# Patient Record
Sex: Male | Born: 1965 | Race: White | Hispanic: No | Marital: Single | Smoking: Former smoker
Health system: Southern US, Community
[De-identification: ages and names within clinical notes are randomized; demographics above are authoritative.]

## PROBLEM LIST (undated history)

## (undated) DIAGNOSIS — I2510 Atherosclerotic heart disease of native coronary artery without angina pectoris: Secondary | ICD-10-CM

## (undated) DIAGNOSIS — E119 Type 2 diabetes mellitus without complications: Secondary | ICD-10-CM

## (undated) DIAGNOSIS — M238X1 Other internal derangements of right knee: Secondary | ICD-10-CM

## (undated) DIAGNOSIS — S7292XA Unspecified fracture of left femur, initial encounter for closed fracture: Secondary | ICD-10-CM

## (undated) DIAGNOSIS — E7800 Pure hypercholesterolemia, unspecified: Secondary | ICD-10-CM

## (undated) HISTORY — PX: IVC FILTER INSERTION: CATH118245

## (undated) HISTORY — PX: FRACTURE SURGERY: SHX138

---

## 1998-10-12 DIAGNOSIS — S069XAA Unspecified intracranial injury with loss of consciousness status unknown, initial encounter: Secondary | ICD-10-CM

## 1998-10-12 HISTORY — DX: Unspecified intracranial injury with loss of consciousness status unknown, initial encounter: S06.9XAA

## 2008-09-13 ENCOUNTER — Emergency Department: Payer: Self-pay | Admitting: Unknown Physician Specialty

## 2011-11-13 ENCOUNTER — Ambulatory Visit: Payer: Self-pay

## 2013-10-18 ENCOUNTER — Emergency Department: Payer: Self-pay | Admitting: Internal Medicine

## 2013-11-01 ENCOUNTER — Emergency Department: Payer: Self-pay | Admitting: Emergency Medicine

## 2014-08-17 ENCOUNTER — Inpatient Hospital Stay: Payer: Self-pay | Admitting: General Practice

## 2014-08-17 LAB — COMPREHENSIVE METABOLIC PANEL
Albumin: 3.6 g/dL (ref 3.4–5.0)
Alkaline Phosphatase: 179 U/L — ABNORMAL HIGH
Anion Gap: 11 (ref 7–16)
BUN: 14 mg/dL (ref 7–18)
Bilirubin,Total: 0.4 mg/dL (ref 0.2–1.0)
Calcium, Total: 8.3 mg/dL — ABNORMAL LOW (ref 8.5–10.1)
Chloride: 100 mmol/L (ref 98–107)
Co2: 23 mmol/L (ref 21–32)
Creatinine: 0.77 mg/dL (ref 0.60–1.30)
EGFR (African American): >60
EGFR (Non-African Amer.): >60
Glucose: 379 mg/dL — ABNORMAL HIGH (ref 65–99)
Osmolality: 284 (ref 275–301)
Potassium: 4 mmol/L (ref 3.5–5.1)
SGOT(AST): 27 U/L (ref 15–37)
SGPT (ALT): 41 U/L
Sodium: 134 mmol/L — ABNORMAL LOW (ref 136–145)
Total Protein: 8.3 g/dL — ABNORMAL HIGH (ref 6.4–8.2)

## 2014-08-17 LAB — CBC
HCT: 46.9 % (ref 40.0–52.0)
HGB: 15.6 g/dL (ref 13.0–18.0)
MCH: 30.9 pg (ref 26.0–34.0)
MCHC: 33.2 g/dL (ref 32.0–36.0)
MCV: 93 fL (ref 80–100)
Platelet: 278 10*3/uL (ref 150–440)
RBC: 5.02 10*6/uL (ref 4.40–5.90)
RDW: 12.5 % (ref 11.5–14.5)
WBC: 12.8 10*3/uL — ABNORMAL HIGH (ref 3.8–10.6)

## 2014-08-17 LAB — HEMOGLOBIN: HGB: 15.4 g/dL (ref 13.0–18.0)

## 2014-08-17 LAB — PROTIME-INR
INR: 1
Prothrombin Time: 13 secs (ref 11.5–14.7)

## 2014-08-17 LAB — APTT: Activated PTT: 28.1 secs (ref 23.6–35.9)

## 2014-08-17 LAB — HEMOGLOBIN A1C: Hemoglobin A1C: 12.6 % — ABNORMAL HIGH (ref 4.2–6.3)

## 2014-08-18 LAB — TROPONIN I
Troponin-I: <0.02 ng/mL
Troponin-I: <0.02 ng/mL

## 2014-08-19 LAB — CBC WITH DIFFERENTIAL/PLATELET
Basophil #: 0.1 10*3/uL (ref 0.0–0.1)
Basophil %: 0.7 %
Eosinophil #: 0.3 10*3/uL (ref 0.0–0.7)
Eosinophil %: 3.4 %
HCT: 39.7 % — ABNORMAL LOW (ref 40.0–52.0)
HGB: 13.3 g/dL (ref 13.0–18.0)
Lymphocyte #: 2.9 10*3/uL (ref 1.0–3.6)
Lymphocyte %: 34.1 %
MCH: 30.9 pg (ref 26.0–34.0)
MCHC: 33.5 g/dL (ref 32.0–36.0)
MCV: 92 fL (ref 80–100)
Monocyte #: 0.9 x10 3/mm (ref 0.2–1.0)
Monocyte %: 11 %
Neutrophil #: 4.3 10*3/uL (ref 1.4–6.5)
Neutrophil %: 50.8 %
Platelet: 241 10*3/uL (ref 150–440)
RBC: 4.32 10*6/uL — ABNORMAL LOW (ref 4.40–5.90)
RDW: 12.6 % (ref 11.5–14.5)
WBC: 8.4 10*3/uL (ref 3.8–10.6)

## 2014-08-19 LAB — BASIC METABOLIC PANEL WITH GFR
Anion Gap: 9
BUN: 10 mg/dL
Calcium, Total: 7.7 mg/dL — ABNORMAL LOW
Chloride: 105 mmol/L
Co2: 23 mmol/L
Creatinine: 0.61 mg/dL
EGFR (African American): >60
EGFR (Non-African Amer.): >60
Glucose: 278 mg/dL — ABNORMAL HIGH
Osmolality: 283
Potassium: 3.9 mmol/L
Sodium: 137 mmol/L

## 2014-08-19 LAB — CK TOTAL AND CKMB (NOT AT ARMC)
CK, Total: 144 U/L
CK, Total: 159 U/L
CK-MB: 1.8 ng/mL (ref 0.5–3.6)
CK-MB: 2.1 ng/mL (ref 0.5–3.6)

## 2014-08-19 LAB — TROPONIN I
Troponin-I: <0.02 ng/mL
Troponin-I: <0.02 ng/mL
Troponin-I: <0.02 ng/mL

## 2014-08-19 LAB — LIPID PANEL
Cholesterol: 131 mg/dL (ref 0–200)
HDL Cholesterol: 35 mg/dL — ABNORMAL LOW (ref 40–60)
Ldl Cholesterol, Calc: 71 mg/dL (ref 0–100)
Triglycerides: 123 mg/dL (ref 0–200)
VLDL Cholesterol, Calc: 25 mg/dL (ref 5–40)

## 2014-08-20 LAB — BASIC METABOLIC PANEL
Anion Gap: 7 (ref 7–16)
BUN: 7 mg/dL (ref 7–18)
Calcium, Total: 7.9 mg/dL — ABNORMAL LOW (ref 8.5–10.1)
Chloride: 104 mmol/L (ref 98–107)
Co2: 27 mmol/L (ref 21–32)
Creatinine: 0.54 mg/dL — ABNORMAL LOW (ref 0.60–1.30)
EGFR (African American): >60
EGFR (Non-African Amer.): >60
Glucose: 225 mg/dL — ABNORMAL HIGH (ref 65–99)
Osmolality: 281 (ref 275–301)
Potassium: 3.8 mmol/L (ref 3.5–5.1)
Sodium: 138 mmol/L (ref 136–145)

## 2014-08-20 LAB — CK TOTAL AND CKMB (NOT AT ARMC)
CK, Total: 171 U/L
CK-MB: 1.8 ng/mL (ref 0.5–3.6)

## 2014-08-20 LAB — HEMOGLOBIN: HGB: 13 g/dL (ref 13.0–18.0)

## 2014-08-20 LAB — PLATELET COUNT: Platelet: 256 10*3/uL (ref 150–440)

## 2014-08-20 LAB — TROPONIN I: Troponin-I: <0.02 ng/mL

## 2014-09-14 DIAGNOSIS — E1140 Type 2 diabetes mellitus with diabetic neuropathy, unspecified: Secondary | ICD-10-CM | POA: Insufficient documentation

## 2014-09-14 DIAGNOSIS — E1169 Type 2 diabetes mellitus with other specified complication: Secondary | ICD-10-CM | POA: Insufficient documentation

## 2014-09-14 DIAGNOSIS — E119 Type 2 diabetes mellitus without complications: Secondary | ICD-10-CM | POA: Insufficient documentation

## 2014-09-14 DIAGNOSIS — Z794 Long term (current) use of insulin: Secondary | ICD-10-CM | POA: Insufficient documentation

## 2014-09-14 DIAGNOSIS — I2510 Atherosclerotic heart disease of native coronary artery without angina pectoris: Secondary | ICD-10-CM | POA: Insufficient documentation

## 2014-10-15 DIAGNOSIS — Z96642 Presence of left artificial hip joint: Secondary | ICD-10-CM | POA: Diagnosis not present

## 2014-10-15 DIAGNOSIS — E669 Obesity, unspecified: Secondary | ICD-10-CM | POA: Diagnosis not present

## 2014-10-15 DIAGNOSIS — E119 Type 2 diabetes mellitus without complications: Secondary | ICD-10-CM | POA: Diagnosis not present

## 2014-10-15 DIAGNOSIS — S72462D Displaced supracondylar fracture with intracondylar extension of lower end of left femur, subsequent encounter for closed fracture with routine healing: Secondary | ICD-10-CM | POA: Diagnosis not present

## 2014-10-15 DIAGNOSIS — Z794 Long term (current) use of insulin: Secondary | ICD-10-CM | POA: Diagnosis not present

## 2014-10-15 DIAGNOSIS — Z9181 History of falling: Secondary | ICD-10-CM | POA: Diagnosis not present

## 2014-10-17 DIAGNOSIS — Z794 Long term (current) use of insulin: Secondary | ICD-10-CM | POA: Diagnosis not present

## 2014-10-17 DIAGNOSIS — Z96642 Presence of left artificial hip joint: Secondary | ICD-10-CM | POA: Diagnosis not present

## 2014-10-17 DIAGNOSIS — E669 Obesity, unspecified: Secondary | ICD-10-CM | POA: Diagnosis not present

## 2014-10-17 DIAGNOSIS — E119 Type 2 diabetes mellitus without complications: Secondary | ICD-10-CM | POA: Diagnosis not present

## 2014-10-17 DIAGNOSIS — S72462D Displaced supracondylar fracture with intracondylar extension of lower end of left femur, subsequent encounter for closed fracture with routine healing: Secondary | ICD-10-CM | POA: Diagnosis not present

## 2014-10-17 DIAGNOSIS — Z9181 History of falling: Secondary | ICD-10-CM | POA: Diagnosis not present

## 2014-10-18 DIAGNOSIS — E669 Obesity, unspecified: Secondary | ICD-10-CM | POA: Diagnosis not present

## 2014-10-18 DIAGNOSIS — Z96642 Presence of left artificial hip joint: Secondary | ICD-10-CM | POA: Diagnosis not present

## 2014-10-18 DIAGNOSIS — Z794 Long term (current) use of insulin: Secondary | ICD-10-CM | POA: Diagnosis not present

## 2014-10-18 DIAGNOSIS — S72462D Displaced supracondylar fracture with intracondylar extension of lower end of left femur, subsequent encounter for closed fracture with routine healing: Secondary | ICD-10-CM | POA: Diagnosis not present

## 2014-10-18 DIAGNOSIS — E119 Type 2 diabetes mellitus without complications: Secondary | ICD-10-CM | POA: Diagnosis not present

## 2014-10-18 DIAGNOSIS — Z9181 History of falling: Secondary | ICD-10-CM | POA: Diagnosis not present

## 2014-10-22 DIAGNOSIS — Z794 Long term (current) use of insulin: Secondary | ICD-10-CM | POA: Diagnosis not present

## 2014-10-22 DIAGNOSIS — E119 Type 2 diabetes mellitus without complications: Secondary | ICD-10-CM | POA: Diagnosis not present

## 2014-10-22 DIAGNOSIS — S72462D Displaced supracondylar fracture with intracondylar extension of lower end of left femur, subsequent encounter for closed fracture with routine healing: Secondary | ICD-10-CM | POA: Diagnosis not present

## 2014-10-22 DIAGNOSIS — Z9181 History of falling: Secondary | ICD-10-CM | POA: Diagnosis not present

## 2014-10-22 DIAGNOSIS — Z96642 Presence of left artificial hip joint: Secondary | ICD-10-CM | POA: Diagnosis not present

## 2014-10-22 DIAGNOSIS — E669 Obesity, unspecified: Secondary | ICD-10-CM | POA: Diagnosis not present

## 2014-10-23 DIAGNOSIS — Z96642 Presence of left artificial hip joint: Secondary | ICD-10-CM | POA: Diagnosis not present

## 2014-10-23 DIAGNOSIS — Z9181 History of falling: Secondary | ICD-10-CM | POA: Diagnosis not present

## 2014-10-23 DIAGNOSIS — E119 Type 2 diabetes mellitus without complications: Secondary | ICD-10-CM | POA: Diagnosis not present

## 2014-10-23 DIAGNOSIS — E669 Obesity, unspecified: Secondary | ICD-10-CM | POA: Diagnosis not present

## 2014-10-23 DIAGNOSIS — Z794 Long term (current) use of insulin: Secondary | ICD-10-CM | POA: Diagnosis not present

## 2014-10-23 DIAGNOSIS — S72462D Displaced supracondylar fracture with intracondylar extension of lower end of left femur, subsequent encounter for closed fracture with routine healing: Secondary | ICD-10-CM | POA: Diagnosis not present

## 2014-10-24 DIAGNOSIS — R0602 Shortness of breath: Secondary | ICD-10-CM | POA: Diagnosis not present

## 2014-10-25 DIAGNOSIS — Z96642 Presence of left artificial hip joint: Secondary | ICD-10-CM | POA: Diagnosis not present

## 2014-10-25 DIAGNOSIS — Z9181 History of falling: Secondary | ICD-10-CM | POA: Diagnosis not present

## 2014-10-25 DIAGNOSIS — E669 Obesity, unspecified: Secondary | ICD-10-CM | POA: Diagnosis not present

## 2014-10-25 DIAGNOSIS — S72462D Displaced supracondylar fracture with intracondylar extension of lower end of left femur, subsequent encounter for closed fracture with routine healing: Secondary | ICD-10-CM | POA: Diagnosis not present

## 2014-10-25 DIAGNOSIS — Z794 Long term (current) use of insulin: Secondary | ICD-10-CM | POA: Diagnosis not present

## 2014-10-25 DIAGNOSIS — E119 Type 2 diabetes mellitus without complications: Secondary | ICD-10-CM | POA: Diagnosis not present

## 2014-10-26 DIAGNOSIS — E119 Type 2 diabetes mellitus without complications: Secondary | ICD-10-CM | POA: Diagnosis not present

## 2014-10-26 DIAGNOSIS — S72462D Displaced supracondylar fracture with intracondylar extension of lower end of left femur, subsequent encounter for closed fracture with routine healing: Secondary | ICD-10-CM | POA: Diagnosis not present

## 2014-10-26 DIAGNOSIS — Z9181 History of falling: Secondary | ICD-10-CM | POA: Diagnosis not present

## 2014-10-26 DIAGNOSIS — E669 Obesity, unspecified: Secondary | ICD-10-CM | POA: Diagnosis not present

## 2014-10-26 DIAGNOSIS — Z96642 Presence of left artificial hip joint: Secondary | ICD-10-CM | POA: Diagnosis not present

## 2014-10-26 DIAGNOSIS — Z794 Long term (current) use of insulin: Secondary | ICD-10-CM | POA: Diagnosis not present

## 2014-10-27 DIAGNOSIS — S72412A Displaced unspecified condyle fracture of lower end of left femur, initial encounter for closed fracture: Secondary | ICD-10-CM | POA: Diagnosis not present

## 2014-10-29 DIAGNOSIS — Z7982 Long term (current) use of aspirin: Secondary | ICD-10-CM | POA: Diagnosis not present

## 2014-10-29 DIAGNOSIS — S72462D Displaced supracondylar fracture with intracondylar extension of lower end of left femur, subsequent encounter for closed fracture with routine healing: Secondary | ICD-10-CM | POA: Diagnosis not present

## 2014-10-29 DIAGNOSIS — E119 Type 2 diabetes mellitus without complications: Secondary | ICD-10-CM | POA: Diagnosis not present

## 2014-10-29 DIAGNOSIS — I10 Essential (primary) hypertension: Secondary | ICD-10-CM | POA: Diagnosis not present

## 2014-10-29 DIAGNOSIS — Z9181 History of falling: Secondary | ICD-10-CM | POA: Diagnosis not present

## 2014-10-29 DIAGNOSIS — Z794 Long term (current) use of insulin: Secondary | ICD-10-CM | POA: Diagnosis not present

## 2014-10-29 DIAGNOSIS — Z96642 Presence of left artificial hip joint: Secondary | ICD-10-CM | POA: Diagnosis not present

## 2014-10-29 DIAGNOSIS — E669 Obesity, unspecified: Secondary | ICD-10-CM | POA: Diagnosis not present

## 2014-10-30 DIAGNOSIS — E780 Pure hypercholesterolemia: Secondary | ICD-10-CM | POA: Diagnosis not present

## 2014-10-30 DIAGNOSIS — Z8679 Personal history of other diseases of the circulatory system: Secondary | ICD-10-CM | POA: Diagnosis not present

## 2014-10-30 DIAGNOSIS — R9431 Abnormal electrocardiogram [ECG] [EKG]: Secondary | ICD-10-CM | POA: Diagnosis not present

## 2014-10-30 DIAGNOSIS — E119 Type 2 diabetes mellitus without complications: Secondary | ICD-10-CM | POA: Diagnosis not present

## 2014-10-31 DIAGNOSIS — Z96642 Presence of left artificial hip joint: Secondary | ICD-10-CM | POA: Diagnosis not present

## 2014-10-31 DIAGNOSIS — E669 Obesity, unspecified: Secondary | ICD-10-CM | POA: Diagnosis not present

## 2014-10-31 DIAGNOSIS — Z794 Long term (current) use of insulin: Secondary | ICD-10-CM | POA: Diagnosis not present

## 2014-10-31 DIAGNOSIS — Z7982 Long term (current) use of aspirin: Secondary | ICD-10-CM | POA: Diagnosis not present

## 2014-10-31 DIAGNOSIS — Z9181 History of falling: Secondary | ICD-10-CM | POA: Diagnosis not present

## 2014-10-31 DIAGNOSIS — S72462D Displaced supracondylar fracture with intracondylar extension of lower end of left femur, subsequent encounter for closed fracture with routine healing: Secondary | ICD-10-CM | POA: Diagnosis not present

## 2014-10-31 DIAGNOSIS — I10 Essential (primary) hypertension: Secondary | ICD-10-CM | POA: Diagnosis not present

## 2014-10-31 DIAGNOSIS — E780 Pure hypercholesterolemia: Secondary | ICD-10-CM | POA: Diagnosis not present

## 2014-10-31 DIAGNOSIS — E119 Type 2 diabetes mellitus without complications: Secondary | ICD-10-CM | POA: Diagnosis not present

## 2014-11-01 DIAGNOSIS — Z794 Long term (current) use of insulin: Secondary | ICD-10-CM | POA: Diagnosis not present

## 2014-11-01 DIAGNOSIS — E669 Obesity, unspecified: Secondary | ICD-10-CM | POA: Diagnosis not present

## 2014-11-01 DIAGNOSIS — E119 Type 2 diabetes mellitus without complications: Secondary | ICD-10-CM | POA: Diagnosis not present

## 2014-11-01 DIAGNOSIS — I10 Essential (primary) hypertension: Secondary | ICD-10-CM | POA: Diagnosis not present

## 2014-11-01 DIAGNOSIS — Z7982 Long term (current) use of aspirin: Secondary | ICD-10-CM | POA: Diagnosis not present

## 2014-11-01 DIAGNOSIS — S72462D Displaced supracondylar fracture with intracondylar extension of lower end of left femur, subsequent encounter for closed fracture with routine healing: Secondary | ICD-10-CM | POA: Diagnosis not present

## 2014-11-01 DIAGNOSIS — Z96642 Presence of left artificial hip joint: Secondary | ICD-10-CM | POA: Diagnosis not present

## 2014-11-01 DIAGNOSIS — Z9181 History of falling: Secondary | ICD-10-CM | POA: Diagnosis not present

## 2014-11-06 DIAGNOSIS — I10 Essential (primary) hypertension: Secondary | ICD-10-CM | POA: Diagnosis not present

## 2014-11-06 DIAGNOSIS — Z9181 History of falling: Secondary | ICD-10-CM | POA: Diagnosis not present

## 2014-11-06 DIAGNOSIS — Z794 Long term (current) use of insulin: Secondary | ICD-10-CM | POA: Diagnosis not present

## 2014-11-06 DIAGNOSIS — S72462D Displaced supracondylar fracture with intracondylar extension of lower end of left femur, subsequent encounter for closed fracture with routine healing: Secondary | ICD-10-CM | POA: Diagnosis not present

## 2014-11-06 DIAGNOSIS — Z96642 Presence of left artificial hip joint: Secondary | ICD-10-CM | POA: Diagnosis not present

## 2014-11-06 DIAGNOSIS — E669 Obesity, unspecified: Secondary | ICD-10-CM | POA: Diagnosis not present

## 2014-11-06 DIAGNOSIS — Z7982 Long term (current) use of aspirin: Secondary | ICD-10-CM | POA: Diagnosis not present

## 2014-11-06 DIAGNOSIS — E119 Type 2 diabetes mellitus without complications: Secondary | ICD-10-CM | POA: Diagnosis not present

## 2014-11-08 DIAGNOSIS — E669 Obesity, unspecified: Secondary | ICD-10-CM | POA: Diagnosis not present

## 2014-11-08 DIAGNOSIS — Z96642 Presence of left artificial hip joint: Secondary | ICD-10-CM | POA: Diagnosis not present

## 2014-11-08 DIAGNOSIS — Z7982 Long term (current) use of aspirin: Secondary | ICD-10-CM | POA: Diagnosis not present

## 2014-11-08 DIAGNOSIS — S72452D Displaced supracondylar fracture without intracondylar extension of lower end of left femur, subsequent encounter for closed fracture with routine healing: Secondary | ICD-10-CM | POA: Diagnosis not present

## 2014-11-08 DIAGNOSIS — Z794 Long term (current) use of insulin: Secondary | ICD-10-CM | POA: Diagnosis not present

## 2014-11-08 DIAGNOSIS — I10 Essential (primary) hypertension: Secondary | ICD-10-CM | POA: Diagnosis not present

## 2014-11-08 DIAGNOSIS — E119 Type 2 diabetes mellitus without complications: Secondary | ICD-10-CM | POA: Diagnosis not present

## 2014-11-08 DIAGNOSIS — Z9181 History of falling: Secondary | ICD-10-CM | POA: Diagnosis not present

## 2014-11-08 DIAGNOSIS — S72462D Displaced supracondylar fracture with intracondylar extension of lower end of left femur, subsequent encounter for closed fracture with routine healing: Secondary | ICD-10-CM | POA: Diagnosis not present

## 2014-11-09 DIAGNOSIS — Z96642 Presence of left artificial hip joint: Secondary | ICD-10-CM | POA: Diagnosis not present

## 2014-11-09 DIAGNOSIS — Z9181 History of falling: Secondary | ICD-10-CM | POA: Diagnosis not present

## 2014-11-09 DIAGNOSIS — E669 Obesity, unspecified: Secondary | ICD-10-CM | POA: Diagnosis not present

## 2014-11-09 DIAGNOSIS — Z7982 Long term (current) use of aspirin: Secondary | ICD-10-CM | POA: Diagnosis not present

## 2014-11-09 DIAGNOSIS — E119 Type 2 diabetes mellitus without complications: Secondary | ICD-10-CM | POA: Diagnosis not present

## 2014-11-09 DIAGNOSIS — I10 Essential (primary) hypertension: Secondary | ICD-10-CM | POA: Diagnosis not present

## 2014-11-09 DIAGNOSIS — Z794 Long term (current) use of insulin: Secondary | ICD-10-CM | POA: Diagnosis not present

## 2014-11-09 DIAGNOSIS — S72462D Displaced supracondylar fracture with intracondylar extension of lower end of left femur, subsequent encounter for closed fracture with routine healing: Secondary | ICD-10-CM | POA: Diagnosis not present

## 2014-11-12 DIAGNOSIS — E669 Obesity, unspecified: Secondary | ICD-10-CM | POA: Diagnosis not present

## 2014-11-12 DIAGNOSIS — Z9181 History of falling: Secondary | ICD-10-CM | POA: Diagnosis not present

## 2014-11-12 DIAGNOSIS — Z96642 Presence of left artificial hip joint: Secondary | ICD-10-CM | POA: Diagnosis not present

## 2014-11-12 DIAGNOSIS — Z7982 Long term (current) use of aspirin: Secondary | ICD-10-CM | POA: Diagnosis not present

## 2014-11-12 DIAGNOSIS — Z794 Long term (current) use of insulin: Secondary | ICD-10-CM | POA: Diagnosis not present

## 2014-11-12 DIAGNOSIS — E119 Type 2 diabetes mellitus without complications: Secondary | ICD-10-CM | POA: Diagnosis not present

## 2014-11-12 DIAGNOSIS — S72462D Displaced supracondylar fracture with intracondylar extension of lower end of left femur, subsequent encounter for closed fracture with routine healing: Secondary | ICD-10-CM | POA: Diagnosis not present

## 2014-11-12 DIAGNOSIS — I10 Essential (primary) hypertension: Secondary | ICD-10-CM | POA: Diagnosis not present

## 2014-11-15 DIAGNOSIS — M79652 Pain in left thigh: Secondary | ICD-10-CM | POA: Diagnosis not present

## 2014-11-22 DIAGNOSIS — M79652 Pain in left thigh: Secondary | ICD-10-CM | POA: Diagnosis not present

## 2014-11-27 DIAGNOSIS — S72412A Displaced unspecified condyle fracture of lower end of left femur, initial encounter for closed fracture: Secondary | ICD-10-CM | POA: Diagnosis not present

## 2014-11-30 DIAGNOSIS — M79652 Pain in left thigh: Secondary | ICD-10-CM | POA: Diagnosis not present

## 2014-12-04 DIAGNOSIS — M79652 Pain in left thigh: Secondary | ICD-10-CM | POA: Diagnosis not present

## 2014-12-06 DIAGNOSIS — M79652 Pain in left thigh: Secondary | ICD-10-CM | POA: Diagnosis not present

## 2014-12-13 DIAGNOSIS — M79652 Pain in left thigh: Secondary | ICD-10-CM | POA: Diagnosis not present

## 2014-12-17 DIAGNOSIS — Z794 Long term (current) use of insulin: Secondary | ICD-10-CM | POA: Diagnosis not present

## 2014-12-17 DIAGNOSIS — I10 Essential (primary) hypertension: Secondary | ICD-10-CM | POA: Diagnosis not present

## 2014-12-17 DIAGNOSIS — E119 Type 2 diabetes mellitus without complications: Secondary | ICD-10-CM | POA: Diagnosis not present

## 2014-12-17 DIAGNOSIS — I2510 Atherosclerotic heart disease of native coronary artery without angina pectoris: Secondary | ICD-10-CM | POA: Diagnosis not present

## 2014-12-18 DIAGNOSIS — M238X1 Other internal derangements of right knee: Secondary | ICD-10-CM | POA: Diagnosis not present

## 2014-12-18 DIAGNOSIS — S72452D Displaced supracondylar fracture without intracondylar extension of lower end of left femur, subsequent encounter for closed fracture with routine healing: Secondary | ICD-10-CM | POA: Diagnosis not present

## 2014-12-20 DIAGNOSIS — M79652 Pain in left thigh: Secondary | ICD-10-CM | POA: Diagnosis not present

## 2014-12-26 DIAGNOSIS — S72412A Displaced unspecified condyle fracture of lower end of left femur, initial encounter for closed fracture: Secondary | ICD-10-CM | POA: Diagnosis not present

## 2014-12-27 DIAGNOSIS — E1165 Type 2 diabetes mellitus with hyperglycemia: Secondary | ICD-10-CM | POA: Diagnosis not present

## 2014-12-28 DIAGNOSIS — M79652 Pain in left thigh: Secondary | ICD-10-CM | POA: Diagnosis not present

## 2014-12-28 DIAGNOSIS — E1165 Type 2 diabetes mellitus with hyperglycemia: Secondary | ICD-10-CM | POA: Diagnosis not present

## 2015-01-01 DIAGNOSIS — M79652 Pain in left thigh: Secondary | ICD-10-CM | POA: Diagnosis not present

## 2015-01-03 DIAGNOSIS — M79652 Pain in left thigh: Secondary | ICD-10-CM | POA: Diagnosis not present

## 2015-01-15 DIAGNOSIS — M79652 Pain in left thigh: Secondary | ICD-10-CM | POA: Diagnosis not present

## 2015-01-17 DIAGNOSIS — M79652 Pain in left thigh: Secondary | ICD-10-CM | POA: Diagnosis not present

## 2015-01-17 DIAGNOSIS — S83511A Sprain of anterior cruciate ligament of right knee, initial encounter: Secondary | ICD-10-CM | POA: Diagnosis not present

## 2015-01-22 DIAGNOSIS — M79652 Pain in left thigh: Secondary | ICD-10-CM | POA: Diagnosis not present

## 2015-01-24 DIAGNOSIS — M79652 Pain in left thigh: Secondary | ICD-10-CM | POA: Diagnosis not present

## 2015-01-26 DIAGNOSIS — S72412A Displaced unspecified condyle fracture of lower end of left femur, initial encounter for closed fracture: Secondary | ICD-10-CM | POA: Diagnosis not present
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
## 2015-02-02 NOTE — Consult Note (Signed)
PATIENT NAME:  Shawn Rivas, Shawn Rivas DATE OF BIRTH:  1966/05/29  DATE OF CONSULTATION:  08/19/2014  REFERRING PHYSICIAN:   CONSULTING PHYSICIAN:  Alinda SierrasEileen A. Jarold Mottoomano, PA-C  Alinda SierrasEileen A. Jarold Mottoomano, physician assistant, dictating cardiology consult for Dr. Adrian BlackwaterShaukat Khan.  INDICATION FOR CONSULT: Preoperative clearance.   HISTORY OF PRESENT ILLNESS: This is a 49 year old, Caucasian male, who presented to the ER status post a mechanical fall and left femoral fracture needing open reduction, internal fixation procedure. He states the fall was mechanical and denies any dizziness or presyncope. He denies any chest pain, shortness of breath, nausea, vomiting, diuresis or left arm or jaw pain.   PAST MEDICAL HISTORY: New onset diabetes mellitus.   MEDICATIONS: Aspirin 81 mg daily.   ALLERGIES: TB SKIN TEST.   SOCIAL HISTORY: No EtOH abuse. A 40 pack-year history, but quit 2 years ago.   FAMILY HISTORY: Mother had PCI and died of an MI at age 49.   REVIEW OF SYSTEMS: CONSTITUTIONAL: No fatigue or malaise.  RESPIRATORY: No shortness of breath or cough. CARDIOVASCULAR: No chest pain, palpitations or orthopnea.  GASTROINTESTINAL: No abdominal pain. No nausea or vomiting.   PHYSICAL EXAMINATION: VITAL SIGNS: Temperature 98.2, respirations 20, pulse 95, blood pressure 125/80, and 99% saturation on room air.  GENERAL: A and O x 3, in no acute distress.  NECK: No JVD or carotid bruit.  CARDIOVASCULAR: Normal S1, S2. No audible murmurs. PULMONARY: Good air entry bilaterally. No wheezes, rhonchi or rales.  EXTREMITIES: No pedal edema. Left lower extremity is in a trapeze.   LABORATORY DATA: Creatinine 0.77, troponin negative x 3.   EKG shows sinus tachycardia 101 beats per minute. Questionable Q wave in inferior leads, left atrial enlargement.   ASSESSMENT AND PLAN: The patient is chest pain free, negative troponins. While he does have risk factors with new onset diabetes mellitus, he remains  low risk for procedure, advise proceeding.   Thank you very much for this consultation.    ____________________________ Alinda SierrasEileen A. Jarold Mottoomano, PA-C ear:JT D: 08/19/2014 10:01:30 ET T: 08/19/2014 10:57:12 ET JOB#: [id number]  cc: Marjean DonnaEileen A. Margarito Courseromano, PA-C, <Dictator> Rise MuEILEEN A Husam Hohn PA ELECTRONICALLY SIGNED 09/15/2014 16:46

## 2015-02-02 NOTE — Consult Note (Signed)
Brief Consult Note: Patient was seen by consultant.   Recommend to proceed with surgery or procedure.   Comments: Pt seen, consultation to be dictated. Pt with new onset DM, obesity and tobacco abuse needing LLE ortho procedure. Pt denies CP or SOB, no PMHx CAD or CHF, will check troponins and echo, but advise proceeding with surgery tomorrow, pt is low-mod risk.  Electronic Signatures: Rise MuRomano, Shakil Dirk A (PA-C)  (Signed [phone]-Nov-15 16:45)  Authored: Brief Consult Note   Last Updated: 07-Nov-15 16:45 by Rise Muomano, Adelene Polivka A (PA-C)

## 2015-02-02 NOTE — Consult Note (Signed)
Patient is stable cardiac point of view, advise proceeding with surgery.  Electronic Signatures: Radene KneeKhan, Jesus Poplin Ali (MD)  (Signed on 07-Nov-15 17:09)  Authored  Last Updated: 07-Nov-15 17:09 by Radene KneeKhan, Obaloluwa Delatte Ali (MD)

## 2015-02-02 NOTE — Consult Note (Signed)
Brief Consult Note: Diagnosis: 49 yr old male with no pmh comes in with fall and left distal femus fracture.fell  due to loss balance;no dizziness,no chest pain.   Comments: 1.left distal femur communited fracture; 2.elevated BG,New onset DMII;check hba1c low risk for planned surgery. use IS,BG with SSI with coverage,plan to start metformin after surgery tahcycarida with out EKG chnages:l due to severe  left knee pain.  Electronic Signatures: Katha HammingKonidena, Jafeth Mustin (MD)  (Signed [phone]-Nov-15 15:44)  Authored: Brief Consult Note   Last Updated: 06-Nov-15 15:44 by Katha HammingKonidena, Azan Maneri (MD)
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
## 2015-02-11 DIAGNOSIS — M79652 Pain in left thigh: Secondary | ICD-10-CM | POA: Diagnosis not present

## 2015-02-14 DIAGNOSIS — M79652 Pain in left thigh: Secondary | ICD-10-CM | POA: Diagnosis not present

## 2015-02-18 DIAGNOSIS — M79652 Pain in left thigh: Secondary | ICD-10-CM | POA: Diagnosis not present

## 2015-02-19 DIAGNOSIS — E1165 Type 2 diabetes mellitus with hyperglycemia: Secondary | ICD-10-CM | POA: Diagnosis not present

## 2015-02-19 DIAGNOSIS — Z794 Long term (current) use of insulin: Secondary | ICD-10-CM | POA: Diagnosis not present

## 2015-02-19 DIAGNOSIS — E119 Type 2 diabetes mellitus without complications: Secondary | ICD-10-CM | POA: Diagnosis not present

## 2015-02-21 DIAGNOSIS — M79652 Pain in left thigh: Secondary | ICD-10-CM | POA: Diagnosis not present

## 2015-02-25 DIAGNOSIS — M79652 Pain in left thigh: Secondary | ICD-10-CM | POA: Diagnosis not present

## 2015-02-25 DIAGNOSIS — S72412A Displaced unspecified condyle fracture of lower end of left femur, initial encounter for closed fracture: Secondary | ICD-10-CM | POA: Diagnosis not present

## 2015-02-28 DIAGNOSIS — M238X1 Other internal derangements of right knee: Secondary | ICD-10-CM | POA: Diagnosis not present

## 2015-02-28 DIAGNOSIS — M79652 Pain in left thigh: Secondary | ICD-10-CM | POA: Diagnosis not present

## 2015-02-28 DIAGNOSIS — S72452D Displaced supracondylar fracture without intracondylar extension of lower end of left femur, subsequent encounter for closed fracture with routine healing: Secondary | ICD-10-CM | POA: Diagnosis not present

## 2015-03-04 DIAGNOSIS — M79652 Pain in left thigh: Secondary | ICD-10-CM | POA: Diagnosis not present

## 2015-03-28 DIAGNOSIS — S72412A Displaced unspecified condyle fracture of lower end of left femur, initial encounter for closed fracture: Secondary | ICD-10-CM | POA: Diagnosis not present

## 2015-04-27 DIAGNOSIS — S72412A Displaced unspecified condyle fracture of lower end of left femur, initial encounter for closed fracture: Secondary | ICD-10-CM | POA: Diagnosis not present

## 2015-05-28 DIAGNOSIS — S72412A Displaced unspecified condyle fracture of lower end of left femur, initial encounter for closed fracture: Secondary | ICD-10-CM | POA: Diagnosis not present
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
## 2016-05-25 ENCOUNTER — Ambulatory Visit
Admission: RE | Admit: 2016-05-25 | Discharge: 2016-05-25 | Disposition: A | Discharge status: Home / Self Care | Payer: Disability Insurance | Source: Ambulatory Visit | Attending: Internal Medicine | Admitting: Internal Medicine

## 2016-05-25 ENCOUNTER — Other Ambulatory Visit: Payer: Self-pay | Admitting: Internal Medicine

## 2016-05-25 DIAGNOSIS — W230XXS Caught, crushed, jammed, or pinched between moving objects, sequela: Secondary | ICD-10-CM | POA: Diagnosis not present

## 2016-05-25 DIAGNOSIS — S9702XA Crushing injury of left ankle, initial encounter: Secondary | ICD-10-CM

## 2016-05-25 DIAGNOSIS — S7702XA Crushing injury of left hip, initial encounter: Secondary | ICD-10-CM

## 2016-05-25 DIAGNOSIS — S7702XS Crushing injury of left hip, sequela: Secondary | ICD-10-CM | POA: Insufficient documentation

## 2016-05-25 DIAGNOSIS — S8702XA Crushing injury of left knee, initial encounter: Secondary | ICD-10-CM

## 2016-05-25 DIAGNOSIS — Z9889 Other specified postprocedural states: Secondary | ICD-10-CM | POA: Diagnosis not present

## 2016-06-08 DIAGNOSIS — Z9119 Patient's noncompliance with other medical treatment and regimen: Secondary | ICD-10-CM | POA: Diagnosis not present

## 2016-06-08 DIAGNOSIS — L02612 Cutaneous abscess of left foot: Secondary | ICD-10-CM | POA: Diagnosis not present

## 2016-06-09 DIAGNOSIS — G8921 Chronic pain due to trauma: Secondary | ICD-10-CM | POA: Insufficient documentation

## 2016-06-09 DIAGNOSIS — E11621 Type 2 diabetes mellitus with foot ulcer: Secondary | ICD-10-CM | POA: Diagnosis not present

## 2016-06-09 DIAGNOSIS — E119 Type 2 diabetes mellitus without complications: Secondary | ICD-10-CM | POA: Diagnosis not present

## 2016-06-09 DIAGNOSIS — L97509 Non-pressure chronic ulcer of other part of unspecified foot with unspecified severity: Secondary | ICD-10-CM | POA: Diagnosis not present

## 2016-06-09 DIAGNOSIS — Z794 Long term (current) use of insulin: Secondary | ICD-10-CM | POA: Diagnosis not present

## 2016-06-09 DIAGNOSIS — L97529 Non-pressure chronic ulcer of other part of left foot with unspecified severity: Secondary | ICD-10-CM | POA: Diagnosis not present

## 2016-06-09 DIAGNOSIS — E1142 Type 2 diabetes mellitus with diabetic polyneuropathy: Secondary | ICD-10-CM | POA: Diagnosis not present

## 2016-06-09 DIAGNOSIS — R030 Elevated blood-pressure reading, without diagnosis of hypertension: Secondary | ICD-10-CM | POA: Diagnosis not present

## 2016-06-09 DIAGNOSIS — L97522 Non-pressure chronic ulcer of other part of left foot with fat layer exposed: Secondary | ICD-10-CM | POA: Diagnosis not present

## 2016-06-16 DIAGNOSIS — L97521 Non-pressure chronic ulcer of other part of left foot limited to breakdown of skin: Secondary | ICD-10-CM | POA: Diagnosis not present

## 2016-06-16 DIAGNOSIS — Z794 Long term (current) use of insulin: Secondary | ICD-10-CM | POA: Diagnosis not present

## 2016-06-16 DIAGNOSIS — E11621 Type 2 diabetes mellitus with foot ulcer: Secondary | ICD-10-CM | POA: Diagnosis not present

## 2016-06-16 DIAGNOSIS — Z0000 Encounter for general adult medical examination without abnormal findings: Secondary | ICD-10-CM | POA: Diagnosis not present

## 2016-06-16 DIAGNOSIS — L97509 Non-pressure chronic ulcer of other part of unspecified foot with unspecified severity: Secondary | ICD-10-CM | POA: Diagnosis not present

## 2016-06-16 DIAGNOSIS — E7800 Pure hypercholesterolemia, unspecified: Secondary | ICD-10-CM | POA: Diagnosis not present

## 2016-06-23 DIAGNOSIS — Z794 Long term (current) use of insulin: Secondary | ICD-10-CM | POA: Diagnosis not present

## 2016-06-23 DIAGNOSIS — E119 Type 2 diabetes mellitus without complications: Secondary | ICD-10-CM | POA: Diagnosis not present

## 2016-06-25 DIAGNOSIS — R197 Diarrhea, unspecified: Secondary | ICD-10-CM | POA: Diagnosis not present

## 2016-06-25 DIAGNOSIS — Z1211 Encounter for screening for malignant neoplasm of colon: Secondary | ICD-10-CM | POA: Diagnosis not present

## 2016-06-30 DIAGNOSIS — L97521 Non-pressure chronic ulcer of other part of left foot limited to breakdown of skin: Secondary | ICD-10-CM | POA: Diagnosis not present

## 2016-06-30 DIAGNOSIS — E11621 Type 2 diabetes mellitus with foot ulcer: Secondary | ICD-10-CM | POA: Diagnosis not present

## 2016-06-30 DIAGNOSIS — I83029 Varicose veins of left lower extremity with ulcer of unspecified site: Secondary | ICD-10-CM | POA: Diagnosis not present

## 2016-06-30 DIAGNOSIS — L97509 Non-pressure chronic ulcer of other part of unspecified foot with unspecified severity: Secondary | ICD-10-CM | POA: Diagnosis not present

## 2016-06-30 DIAGNOSIS — I872 Venous insufficiency (chronic) (peripheral): Secondary | ICD-10-CM | POA: Diagnosis not present

## 2016-07-06 DIAGNOSIS — E7800 Pure hypercholesterolemia, unspecified: Secondary | ICD-10-CM | POA: Diagnosis not present

## 2016-07-06 DIAGNOSIS — E119 Type 2 diabetes mellitus without complications: Secondary | ICD-10-CM | POA: Diagnosis not present

## 2016-07-06 DIAGNOSIS — M545 Low back pain: Secondary | ICD-10-CM | POA: Diagnosis not present

## 2016-07-06 DIAGNOSIS — Z794 Long term (current) use of insulin: Secondary | ICD-10-CM | POA: Diagnosis not present

## 2016-07-14 DIAGNOSIS — N390 Urinary tract infection, site not specified: Secondary | ICD-10-CM | POA: Diagnosis not present

## 2016-07-14 DIAGNOSIS — M62830 Muscle spasm of back: Secondary | ICD-10-CM | POA: Diagnosis not present

## 2016-07-17 DIAGNOSIS — M79609 Pain in unspecified limb: Secondary | ICD-10-CM | POA: Diagnosis not present

## 2016-07-22 DIAGNOSIS — E1165 Type 2 diabetes mellitus with hyperglycemia: Secondary | ICD-10-CM | POA: Diagnosis not present

## 2016-07-22 DIAGNOSIS — L97521 Non-pressure chronic ulcer of other part of left foot limited to breakdown of skin: Secondary | ICD-10-CM | POA: Diagnosis not present

## 2016-07-22 DIAGNOSIS — E1142 Type 2 diabetes mellitus with diabetic polyneuropathy: Secondary | ICD-10-CM | POA: Diagnosis not present

## 2016-07-22 DIAGNOSIS — Z794 Long term (current) use of insulin: Secondary | ICD-10-CM | POA: Diagnosis not present

## 2016-07-22 DIAGNOSIS — E11621 Type 2 diabetes mellitus with foot ulcer: Secondary | ICD-10-CM | POA: Diagnosis not present

## 2016-07-22 DIAGNOSIS — L97421 Non-pressure chronic ulcer of left heel and midfoot limited to breakdown of skin: Secondary | ICD-10-CM | POA: Diagnosis not present

## 2016-09-30 DIAGNOSIS — Z794 Long term (current) use of insulin: Secondary | ICD-10-CM | POA: Diagnosis not present

## 2016-09-30 DIAGNOSIS — E119 Type 2 diabetes mellitus without complications: Secondary | ICD-10-CM | POA: Diagnosis not present

## 2016-09-30 DIAGNOSIS — E7800 Pure hypercholesterolemia, unspecified: Secondary | ICD-10-CM | POA: Diagnosis not present

## 2016-10-19 DIAGNOSIS — K219 Gastro-esophageal reflux disease without esophagitis: Secondary | ICD-10-CM | POA: Diagnosis not present

## 2016-10-19 DIAGNOSIS — E7800 Pure hypercholesterolemia, unspecified: Secondary | ICD-10-CM | POA: Diagnosis not present

## 2016-10-19 DIAGNOSIS — E119 Type 2 diabetes mellitus without complications: Secondary | ICD-10-CM | POA: Diagnosis not present

## 2016-10-19 DIAGNOSIS — Z794 Long term (current) use of insulin: Secondary | ICD-10-CM | POA: Diagnosis not present

## 2016-10-19 DIAGNOSIS — I2510 Atherosclerotic heart disease of native coronary artery without angina pectoris: Secondary | ICD-10-CM | POA: Diagnosis not present

## 2016-10-23 ENCOUNTER — Encounter: Payer: Self-pay | Admitting: *Deleted

## 2016-10-26 ENCOUNTER — Ambulatory Visit: Admission: RE | Admit: 2016-10-26 | Payer: Medicare Other | Source: Ambulatory Visit | Admitting: Gastroenterology

## 2016-10-26 ENCOUNTER — Encounter: Admission: RE | Payer: Self-pay | Source: Ambulatory Visit

## 2016-10-26 HISTORY — DX: Pure hypercholesterolemia, unspecified: E78.00

## 2016-10-26 HISTORY — DX: Type 2 diabetes mellitus without complications: E11.9

## 2016-10-26 HISTORY — DX: Atherosclerotic heart disease of native coronary artery without angina pectoris: I25.10

## 2016-10-26 HISTORY — DX: Other internal derangements of right knee: M23.8X1

## 2016-10-26 HISTORY — DX: Unspecified fracture of left femur, initial encounter for closed fracture: S72.92XA

## 2016-10-26 SURGERY — COLONOSCOPY WITH PROPOFOL
Anesthesia: General

## 2016-12-18 DIAGNOSIS — M79642 Pain in left hand: Secondary | ICD-10-CM | POA: Diagnosis not present

## 2016-12-18 DIAGNOSIS — M545 Low back pain: Secondary | ICD-10-CM | POA: Diagnosis not present

## 2016-12-18 DIAGNOSIS — M21371 Foot drop, right foot: Secondary | ICD-10-CM | POA: Diagnosis not present

## 2016-12-18 DIAGNOSIS — M79641 Pain in right hand: Secondary | ICD-10-CM | POA: Diagnosis not present

## 2017-03-05 DIAGNOSIS — E1142 Type 2 diabetes mellitus with diabetic polyneuropathy: Secondary | ICD-10-CM | POA: Diagnosis not present

## 2017-03-05 DIAGNOSIS — E11621 Type 2 diabetes mellitus with foot ulcer: Secondary | ICD-10-CM | POA: Diagnosis not present

## 2017-03-05 DIAGNOSIS — I10 Essential (primary) hypertension: Secondary | ICD-10-CM | POA: Diagnosis not present

## 2017-03-05 DIAGNOSIS — E1165 Type 2 diabetes mellitus with hyperglycemia: Secondary | ICD-10-CM | POA: Diagnosis not present

## 2017-03-05 DIAGNOSIS — L97521 Non-pressure chronic ulcer of other part of left foot limited to breakdown of skin: Secondary | ICD-10-CM | POA: Diagnosis not present

## 2017-03-05 DIAGNOSIS — Z794 Long term (current) use of insulin: Secondary | ICD-10-CM | POA: Diagnosis not present

## 2017-03-24 DIAGNOSIS — L97521 Non-pressure chronic ulcer of other part of left foot limited to breakdown of skin: Secondary | ICD-10-CM | POA: Diagnosis not present

## 2017-04-26 DIAGNOSIS — L97521 Non-pressure chronic ulcer of other part of left foot limited to breakdown of skin: Secondary | ICD-10-CM | POA: Diagnosis not present

## 2017-06-21 DIAGNOSIS — M79609 Pain in unspecified limb: Secondary | ICD-10-CM | POA: Diagnosis not present

## 2017-06-21 DIAGNOSIS — M545 Low back pain: Secondary | ICD-10-CM | POA: Diagnosis not present

## 2017-08-04 DIAGNOSIS — E119 Type 2 diabetes mellitus without complications: Secondary | ICD-10-CM | POA: Diagnosis not present

## 2017-08-04 DIAGNOSIS — Z794 Long term (current) use of insulin: Secondary | ICD-10-CM | POA: Diagnosis not present

## 2017-08-04 DIAGNOSIS — Z7185 Encounter for immunization safety counseling: Secondary | ICD-10-CM | POA: Insufficient documentation

## 2017-08-04 DIAGNOSIS — Z0000 Encounter for general adult medical examination without abnormal findings: Secondary | ICD-10-CM | POA: Diagnosis not present

## 2017-08-05 DIAGNOSIS — Z794 Long term (current) use of insulin: Secondary | ICD-10-CM | POA: Diagnosis not present

## 2017-08-05 DIAGNOSIS — E119 Type 2 diabetes mellitus without complications: Secondary | ICD-10-CM | POA: Diagnosis not present

## 2017-08-05 DIAGNOSIS — Z125 Encounter for screening for malignant neoplasm of prostate: Secondary | ICD-10-CM | POA: Diagnosis not present

## 2017-08-05 DIAGNOSIS — Z0000 Encounter for general adult medical examination without abnormal findings: Secondary | ICD-10-CM | POA: Diagnosis not present

## 2017-12-13 DIAGNOSIS — E1142 Type 2 diabetes mellitus with diabetic polyneuropathy: Secondary | ICD-10-CM | POA: Insufficient documentation

## 2018-05-25 DIAGNOSIS — E66811 Obesity, class 1: Secondary | ICD-10-CM | POA: Insufficient documentation

## 2018-09-21 ENCOUNTER — Ambulatory Visit: Payer: Medicare Other | Admitting: Physical Therapy

## 2018-09-27 ENCOUNTER — Ambulatory Visit: Payer: Medicare Other | Admitting: Physical Therapy

## 2018-10-04 ENCOUNTER — Encounter: Payer: Disability Insurance | Admitting: Physical Therapy

## 2018-10-06 ENCOUNTER — Encounter: Payer: Disability Insurance | Admitting: Physical Therapy

## 2018-10-10 ENCOUNTER — Encounter: Payer: Disability Insurance | Admitting: Physical Therapy

## 2018-10-13 ENCOUNTER — Encounter: Payer: Medicare Other | Admitting: Physical Therapy

## 2018-10-20 ENCOUNTER — Ambulatory Visit: Payer: Medicare Other | Attending: Family Medicine | Admitting: Physical Therapy

## 2018-10-20 ENCOUNTER — Encounter: Payer: Self-pay | Admitting: Physical Therapy

## 2018-10-20 DIAGNOSIS — R2689 Other abnormalities of gait and mobility: Secondary | ICD-10-CM | POA: Diagnosis present

## 2018-10-20 DIAGNOSIS — R262 Difficulty in walking, not elsewhere classified: Secondary | ICD-10-CM | POA: Diagnosis present

## 2018-10-20 DIAGNOSIS — M6281 Muscle weakness (generalized): Secondary | ICD-10-CM | POA: Diagnosis present

## 2018-10-20 DIAGNOSIS — R296 Repeated falls: Secondary | ICD-10-CM | POA: Diagnosis present

## 2018-10-20 DIAGNOSIS — R609 Edema, unspecified: Secondary | ICD-10-CM | POA: Diagnosis present

## 2018-10-20 DIAGNOSIS — R209 Unspecified disturbances of skin sensation: Secondary | ICD-10-CM | POA: Diagnosis present

## 2018-10-20 NOTE — Therapy (Signed)
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

## 2018-10-25 ENCOUNTER — Encounter: Payer: Self-pay | Admitting: Physical Therapy

## 2018-10-25 ENCOUNTER — Ambulatory Visit: Payer: Medicare Other | Admitting: Physical Therapy

## 2018-10-25 DIAGNOSIS — R2689 Other abnormalities of gait and mobility: Secondary | ICD-10-CM

## 2018-10-25 DIAGNOSIS — M6281 Muscle weakness (generalized): Secondary | ICD-10-CM

## 2018-10-25 DIAGNOSIS — R296 Repeated falls: Secondary | ICD-10-CM | POA: Diagnosis not present

## 2018-10-25 DIAGNOSIS — R262 Difficulty in walking, not elsewhere classified: Secondary | ICD-10-CM

## 2018-10-25 DIAGNOSIS — R209 Unspecified disturbances of skin sensation: Secondary | ICD-10-CM

## 2018-10-25 DIAGNOSIS — R609 Edema, unspecified: Secondary | ICD-10-CM
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
## 2018-10-27 ENCOUNTER — Ambulatory Visit: Payer: Medicare Other | Admitting: Physical Therapy

## 2018-10-27 DIAGNOSIS — R296 Repeated falls: Secondary | ICD-10-CM

## 2018-10-27 DIAGNOSIS — R609 Edema, unspecified: Secondary | ICD-10-CM

## 2018-10-27 DIAGNOSIS — R262 Difficulty in walking, not elsewhere classified: Secondary | ICD-10-CM

## 2018-10-27 DIAGNOSIS — M6281 Muscle weakness (generalized): Secondary | ICD-10-CM

## 2018-10-27 DIAGNOSIS — R209 Unspecified disturbances of skin sensation: Secondary | ICD-10-CM

## 2018-10-27 DIAGNOSIS — R2689 Other abnormalities of gait and mobility: Secondary | ICD-10-CM
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
## 2018-10-28 ENCOUNTER — Encounter: Payer: Self-pay | Admitting: Physical Therapy

## 2018-11-01 ENCOUNTER — Ambulatory Visit: Payer: Self-pay

## 2018-11-01 ENCOUNTER — Ambulatory Visit: Payer: Medicare Other | Admitting: Physical Therapy

## 2018-11-01 DIAGNOSIS — R262 Difficulty in walking, not elsewhere classified: Secondary | ICD-10-CM

## 2018-11-01 DIAGNOSIS — R296 Repeated falls: Secondary | ICD-10-CM | POA: Diagnosis not present

## 2018-11-01 DIAGNOSIS — R209 Unspecified disturbances of skin sensation: Secondary | ICD-10-CM

## 2018-11-01 DIAGNOSIS — R609 Edema, unspecified: Secondary | ICD-10-CM

## 2018-11-01 DIAGNOSIS — M6281 Muscle weakness (generalized): Secondary | ICD-10-CM

## 2018-11-01 DIAGNOSIS — R2689 Other abnormalities of gait and mobility: Secondary | ICD-10-CM
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
## 2018-11-03 ENCOUNTER — Ambulatory Visit: Payer: Medicare Other | Admitting: Physical Therapy

## 2018-11-03 ENCOUNTER — Encounter: Payer: Self-pay | Admitting: Physical Therapy

## 2018-11-03 DIAGNOSIS — R296 Repeated falls: Secondary | ICD-10-CM | POA: Diagnosis not present

## 2018-11-03 DIAGNOSIS — R2689 Other abnormalities of gait and mobility: Secondary | ICD-10-CM

## 2018-11-03 DIAGNOSIS — M6281 Muscle weakness (generalized): Secondary | ICD-10-CM

## 2018-11-03 DIAGNOSIS — R262 Difficulty in walking, not elsewhere classified: Secondary | ICD-10-CM

## 2018-11-03 DIAGNOSIS — R609 Edema, unspecified: Secondary | ICD-10-CM

## 2018-11-03 DIAGNOSIS — R209 Unspecified disturbances of skin sensation: Secondary | ICD-10-CM
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
## 2018-11-08 ENCOUNTER — Encounter: Payer: Self-pay | Admitting: Physical Therapy

## 2018-11-08 ENCOUNTER — Ambulatory Visit: Payer: Medicare Other | Admitting: Physical Therapy

## 2018-11-08 ENCOUNTER — Ambulatory Visit: Payer: Medicare Other

## 2018-11-08 DIAGNOSIS — R296 Repeated falls: Secondary | ICD-10-CM | POA: Diagnosis not present

## 2018-11-08 DIAGNOSIS — R262 Difficulty in walking, not elsewhere classified: Secondary | ICD-10-CM

## 2018-11-08 DIAGNOSIS — R209 Unspecified disturbances of skin sensation: Secondary | ICD-10-CM

## 2018-11-08 DIAGNOSIS — M6281 Muscle weakness (generalized): Secondary | ICD-10-CM

## 2018-11-08 DIAGNOSIS — R609 Edema, unspecified: Secondary | ICD-10-CM

## 2018-11-08 DIAGNOSIS — R2689 Other abnormalities of gait and mobility: Secondary | ICD-10-CM
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
## 2018-11-10 ENCOUNTER — Encounter: Payer: Self-pay | Admitting: Physical Therapy

## 2018-11-10 ENCOUNTER — Ambulatory Visit: Payer: Medicare Other | Admitting: Physical Therapy

## 2018-11-10 DIAGNOSIS — R209 Unspecified disturbances of skin sensation: Secondary | ICD-10-CM

## 2018-11-10 DIAGNOSIS — R262 Difficulty in walking, not elsewhere classified: Secondary | ICD-10-CM

## 2018-11-10 DIAGNOSIS — R296 Repeated falls: Secondary | ICD-10-CM

## 2018-11-10 DIAGNOSIS — R2689 Other abnormalities of gait and mobility: Secondary | ICD-10-CM

## 2018-11-10 DIAGNOSIS — R609 Edema, unspecified: Secondary | ICD-10-CM

## 2018-11-10 DIAGNOSIS — M6281 Muscle weakness (generalized): Secondary | ICD-10-CM
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
## 2018-11-15 ENCOUNTER — Ambulatory Visit: Payer: Self-pay

## 2018-11-15 ENCOUNTER — Ambulatory Visit: Payer: Medicare Other | Attending: Family Medicine | Admitting: Physical Therapy

## 2018-11-15 ENCOUNTER — Encounter: Payer: Disability Insurance | Admitting: Physical Therapy

## 2018-11-15 ENCOUNTER — Encounter: Payer: Self-pay | Admitting: Physical Therapy

## 2018-11-15 DIAGNOSIS — R609 Edema, unspecified: Secondary | ICD-10-CM | POA: Diagnosis present

## 2018-11-15 DIAGNOSIS — R209 Unspecified disturbances of skin sensation: Secondary | ICD-10-CM | POA: Insufficient documentation

## 2018-11-15 DIAGNOSIS — R262 Difficulty in walking, not elsewhere classified: Secondary | ICD-10-CM | POA: Insufficient documentation

## 2018-11-15 DIAGNOSIS — M6281 Muscle weakness (generalized): Secondary | ICD-10-CM | POA: Diagnosis present

## 2018-11-15 DIAGNOSIS — R2689 Other abnormalities of gait and mobility: Secondary | ICD-10-CM | POA: Diagnosis present

## 2018-11-15 DIAGNOSIS — R296 Repeated falls: Secondary | ICD-10-CM | POA: Diagnosis present
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
## 2018-11-16 ENCOUNTER — Encounter: Payer: Self-pay | Admitting: Physical Therapy

## 2018-11-16 ENCOUNTER — Ambulatory Visit: Payer: Medicare Other | Admitting: Physical Therapy

## 2018-11-16 VITALS — BP 148/92 | HR 90

## 2018-11-16 DIAGNOSIS — R296 Repeated falls: Secondary | ICD-10-CM

## 2018-11-16 DIAGNOSIS — R609 Edema, unspecified: Secondary | ICD-10-CM

## 2018-11-16 DIAGNOSIS — M6281 Muscle weakness (generalized): Secondary | ICD-10-CM

## 2018-11-16 DIAGNOSIS — R262 Difficulty in walking, not elsewhere classified: Secondary | ICD-10-CM

## 2018-11-16 DIAGNOSIS — R2689 Other abnormalities of gait and mobility: Secondary | ICD-10-CM

## 2018-11-16 DIAGNOSIS — R209 Unspecified disturbances of skin sensation: Secondary | ICD-10-CM
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
## 2018-11-22 ENCOUNTER — Encounter: Payer: Disability Insurance | Admitting: Physical Therapy

## 2018-11-22 ENCOUNTER — Ambulatory Visit: Payer: Medicare Other | Admitting: Physical Therapy

## 2018-11-22 ENCOUNTER — Telehealth: Payer: Self-pay | Admitting: Physical Therapy

## 2018-11-22 ENCOUNTER — Ambulatory Visit: Payer: Self-pay

## 2018-11-22 ENCOUNTER — Encounter: Payer: Self-pay | Admitting: Physical Therapy

## 2018-11-22 VITALS — BP 159/88 | HR 106

## 2018-11-22 DIAGNOSIS — R609 Edema, unspecified: Secondary | ICD-10-CM

## 2018-11-22 DIAGNOSIS — R296 Repeated falls: Secondary | ICD-10-CM

## 2018-11-22 DIAGNOSIS — M6281 Muscle weakness (generalized): Secondary | ICD-10-CM

## 2018-11-22 DIAGNOSIS — R209 Unspecified disturbances of skin sensation: Secondary | ICD-10-CM

## 2018-11-22 DIAGNOSIS — R262 Difficulty in walking, not elsewhere classified: Secondary | ICD-10-CM

## 2018-11-22 DIAGNOSIS — R2689 Other abnormalities of gait and mobility: Secondary | ICD-10-CM

## 2018-11-22 NOTE — Telephone Encounter (Signed)
Amy from [facility] called back and provided verbal auth for wound care. I requested it be sent to wound clinic and further described why I was requesting a referral to wound care. Amy said she would take care of it as long as Dr. Burnadette Pop agreed.
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
## 2018-11-22 NOTE — Telephone Encounter (Signed)
Called referring physician to request (if physician agrees) referral to wound care. Left message with staff member.

## 2018-11-24 ENCOUNTER — Ambulatory Visit: Payer: Medicare Other | Admitting: Physical Therapy

## 2018-11-24 ENCOUNTER — Encounter: Payer: Self-pay | Admitting: Physical Therapy

## 2018-11-24 DIAGNOSIS — M6281 Muscle weakness (generalized): Secondary | ICD-10-CM

## 2018-11-24 DIAGNOSIS — R296 Repeated falls: Secondary | ICD-10-CM | POA: Diagnosis not present

## 2018-11-24 DIAGNOSIS — R609 Edema, unspecified: Secondary | ICD-10-CM

## 2018-11-24 DIAGNOSIS — R262 Difficulty in walking, not elsewhere classified: Secondary | ICD-10-CM

## 2018-11-24 DIAGNOSIS — R209 Unspecified disturbances of skin sensation: Secondary | ICD-10-CM

## 2018-11-24 DIAGNOSIS — R2689 Other abnormalities of gait and mobility: Secondary | ICD-10-CM
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
## 2018-11-29 ENCOUNTER — Telehealth: Payer: Self-pay | Admitting: Physical Therapy

## 2018-11-29 ENCOUNTER — Ambulatory Visit: Payer: Medicare Other | Admitting: Physical Therapy

## 2018-11-29 ENCOUNTER — Encounter: Payer: Self-pay | Admitting: Physical Therapy

## 2018-11-29 ENCOUNTER — Ambulatory Visit: Payer: Self-pay

## 2018-11-29 DIAGNOSIS — R209 Unspecified disturbances of skin sensation: Secondary | ICD-10-CM

## 2018-11-29 DIAGNOSIS — R296 Repeated falls: Secondary | ICD-10-CM

## 2018-11-29 DIAGNOSIS — R609 Edema, unspecified: Secondary | ICD-10-CM

## 2018-11-29 DIAGNOSIS — R2689 Other abnormalities of gait and mobility: Secondary | ICD-10-CM

## 2018-11-29 DIAGNOSIS — R262 Difficulty in walking, not elsewhere classified: Secondary | ICD-10-CM

## 2018-11-29 DIAGNOSIS — M6281 Muscle weakness (generalized): Secondary | ICD-10-CM

## 2018-11-29 NOTE — Telephone Encounter (Signed)
Called pt back after he requested a call back after speaking to front office. He reports he took a bad fall around 1:30pm today. He said he would not remember the details if he waited to tell me on Thursday. He was at the shop and was bending over to look at a tire. He is not sure if he blacked out or tripped or what, but he fell and he felt that his right leg "good leg" went out like when he broke his femur. At first he thought he broke something and wouldn't let any help him up for 30 min. He felt like below the R knee "went all crazy" instead of above the knee. He does not report any bruising on his knee at this time and said he had not checked his ankle. York Spaniel his daughter brought him his crutches and he is going to use them for the next couple of days because he feels much safer with them. He states his right leg feels very weak but he does not feel he needs urgent medical attention at this point. Still plans to go to wound care appt tomorrow and may get checked out further if he is still feeling this weak. Said his eyes were not dilated but he did black out and hit his head slightly when he fell. Feels he did not have a concussion (previously reports that he blacks out if he bends too low). He took pain pill. Expects to be very sore tomorrow. I advised him to seek further care if anything worsen or he feels he needs it or weakness does not improve. He plans to come to his next PT appt on Thursday at 11:15am.
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
## 2018-11-30 ENCOUNTER — Encounter: Payer: Medicare Other | Attending: Internal Medicine | Admitting: Internal Medicine

## 2018-11-30 DIAGNOSIS — E1140 Type 2 diabetes mellitus with diabetic neuropathy, unspecified: Secondary | ICD-10-CM | POA: Diagnosis not present

## 2018-11-30 DIAGNOSIS — I2510 Atherosclerotic heart disease of native coronary artery without angina pectoris: Secondary | ICD-10-CM | POA: Insufficient documentation

## 2018-11-30 DIAGNOSIS — L97221 Non-pressure chronic ulcer of left calf limited to breakdown of skin: Secondary | ICD-10-CM | POA: Insufficient documentation

## 2018-11-30 DIAGNOSIS — E11622 Type 2 diabetes mellitus with other skin ulcer: Secondary | ICD-10-CM | POA: Diagnosis not present

## 2018-11-30 DIAGNOSIS — I890 Lymphedema, not elsewhere classified: Secondary | ICD-10-CM | POA: Diagnosis not present

## 2018-11-30 DIAGNOSIS — I10 Essential (primary) hypertension: Secondary | ICD-10-CM | POA: Insufficient documentation

## 2018-12-01 ENCOUNTER — Ambulatory Visit: Payer: Medicare Other | Admitting: Physical Therapy

## 2018-12-01 ENCOUNTER — Encounter: Payer: Self-pay | Admitting: Physical Therapy

## 2018-12-01 VITALS — BP 185/91 | HR 91

## 2018-12-01 DIAGNOSIS — R2689 Other abnormalities of gait and mobility: Secondary | ICD-10-CM

## 2018-12-01 DIAGNOSIS — M6281 Muscle weakness (generalized): Secondary | ICD-10-CM

## 2018-12-01 DIAGNOSIS — R296 Repeated falls: Secondary | ICD-10-CM

## 2018-12-01 DIAGNOSIS — R262 Difficulty in walking, not elsewhere classified: Secondary | ICD-10-CM

## 2018-12-01 DIAGNOSIS — R609 Edema, unspecified: Secondary | ICD-10-CM

## 2018-12-01 DIAGNOSIS — R209 Unspecified disturbances of skin sensation: Secondary | ICD-10-CM

## 2018-12-01 NOTE — Progress Notes (Signed)
Shawn Rivas, Shawn Rivas ([id number]) Visit Report for 11/30/2018 Abuse/Suicide Risk Screen Details Patient Name: Shawn Rivas, Shawn Rivas. Date of Service: 11/30/2018 8:45 AM Medical Record Number: [id number] Patient Account Number: [id number] Date of Birth/Sex: 08-Sep-1966 (53 y.o. M) Treating RN: Curtis Sites Primary Care Lachrista Heslin: Marisue Ivan Other Clinician: Referring Jonae Renshaw: Marisue Ivan Treating Arvie Villarruel/Extender: Altamese [place] in Treatment: 0 Abuse/Suicide Risk Screen Items Answer ABUSE/SUICIDE RISK SCREEN: Has anyone close to you tried to hurt or harm you recentlyo No Do you feel uncomfortable with anyone in your familyo No Has anyone forced you do things that you didnot want to doo No Do you have any thoughts of harming yourselfo No Patient displays signs or symptoms of abuse and/or neglect. No Electronic Signature(s) Signed: 11/30/2018 5:36:16 PM By: Curtis Sites Entered By: Curtis Sites on 11/30/2018 09:09:20 Shawn Rivas ([id number]) -------------------------------------------------------------------------------- Activities of Daily Living Details Patient Name: Shawn Rivas, Shawn Rivas. Date of Service: 11/30/2018 8:45 AM Medical Record Number: [id number] Patient Account Number: [id number] Date of Birth/Sex: Feb 11, 1966 (53 y.o. M) Treating RN: Curtis Sites Primary Care Georgia Delsignore: Marisue Ivan Other Clinician: Referring Keyshia Orwick: Marisue Ivan Treating Cozy Veale/Extender: Altamese [place] in Treatment: 0 Activities of Daily Living Items Answer Activities of Daily Living (Please select one for each item) Drive Automobile Completely Able Take Medications Completely Able Use Telephone Completely Able Care for Appearance Completely Able Use Toilet Completely Able Bath / Shower Completely Able Dress Self Completely Able Feed Self Completely Able Walk Completely Able Get In / Out Bed Completely Able Housework Completely Able Prepare Meals  Completely Able Handle Money Completely Able Shop for Self Completely Able Electronic Signature(s) Signed: 11/30/2018 5:36:16 PM By: Curtis Sites Entered By: Curtis Sites on 11/30/2018 09:09:37 Shawn Rivas ([id number]) -------------------------------------------------------------------------------- Education Assessment Details Patient Name: Shawn Rivas Date of Service: 11/30/2018 8:45 AM Medical Record Number: [id number] Patient Account Number: [id number] Date of Birth/Sex: 1966-06-21 (53 y.o. M) Treating RN: Curtis Sites Primary Care Caycee Wanat: Marisue Ivan Other Clinician: Referring Brevin Mcfadden: Marisue Ivan Treating Bo Teicher/Extender: Altamese [place] in Treatment: 0 Primary Learner Assessed: Patient Learning Preferences/Education Level/Primary Language Learning Preference: Explanation, Demonstration Highest Education Level: High School Preferred Language: English Cognitive Barrier Assessment/Beliefs Language Barrier: No Translator Needed: No Memory Deficit: No Emotional Barrier: No Cultural/Religious Beliefs Affecting Medical Care: No Physical Barrier Assessment Impaired Vision: No Impaired Hearing: No Decreased Hand dexterity: No Knowledge/Comprehension Assessment Knowledge Level: Medium Comprehension Level: Medium Ability to understand written Medium instructions: Ability to understand verbal Medium instructions: Motivation Assessment Anxiety Level: Calm Cooperation: Cooperative Education Importance: Acknowledges Need Interest in Health Problems: Asks Questions Perception: Coherent Willingness to Engage in Self- Medium Management Activities: Readiness to Engage in Self- Medium Management Activities: Electronic Signature(s) Signed: 11/30/2018 5:36:16 PM By: Curtis Sites Entered By: Curtis Sites on 11/30/2018 09:09:59 Shawn Rivas  ([id number]) -------------------------------------------------------------------------------- Fall Risk Assessment Details Patient Name: Shawn Rivas Date of Service: 11/30/2018 8:45 AM Medical Record Number: [id number] Patient Account Number: [id number] Date of Birth/Sex: 1966-08-17 (53 y.o. M) Treating RN: Curtis Sites Primary Care Analeigh Aries: Marisue Ivan Other Clinician: Referring Gareth Fitzner: Marisue Ivan Treating Zelma Mazariego/Extender: Altamese [place] in Treatment: 0 Fall Risk Assessment Items Have you had 2 or more falls in the last 12 monthso 0 No Have you had any fall that resulted in injury in the last 12 monthso 0 No FALL RISK ASSESSMENT: History of falling - immediate or within 3 months 25 Yes Secondary diagnosis 0 No Ambulatory aid None/bed rest/wheelchair/nurse 0 Yes Crutches/cane/walker 0 No Furniture 0  No IV Access/Saline Lock 0 No Gait/Training Normal/bed rest/immobile 0 No Weak 10 Yes Impaired 0 No Mental Status Oriented to own ability 0 Yes Electronic Signature(s) Signed: 11/30/2018 5:36:16 PM By: Curtis Sites Entered By: Curtis Sites on 11/30/2018 09:11:29 Shawn Rivas ([id number]) -------------------------------------------------------------------------------- Foot Assessment Details Patient Name: Shawn Rivas. Date of Service: 11/30/2018 8:45 AM Medical Record Number: [id number] Patient Account Number: [id number] Date of Birth/Sex: 07/02/1966 (53 y.o. M) Treating RN: Curtis Sites Primary Care Jennah Satchell: Marisue Ivan Other Clinician: Referring Jaiveer Panas: Marisue Ivan Treating Jocelyn Lowery/Extender: Altamese [place] in Treatment: 0 Foot Assessment Items Site Locations + = Sensation present, - = Sensation absent, C = Callus, U = Ulcer R = Redness, W = Warmth, M = Maceration, PU = Pre-ulcerative lesion F = Fissure, S = Swelling, D = Dryness Assessment Right: Left: Other Deformity: No No Prior Foot Ulcer: No  No Prior Amputation: No No Charcot Joint: No No Ambulatory Status: Ambulatory With Help Assistance Device: Crutches Gait: Unsteady Electronic Signature(s) Signed: 11/30/2018 5:36:16 PM By: Curtis Sites Entered By: Curtis Sites on 11/30/2018 09:28:31 Shawn Rivas ([id number]) -------------------------------------------------------------------------------- Nutrition Risk Assessment Details Patient Name: Shawn Rivas Date of Service: 11/30/2018 8:45 AM Medical Record Number: [id number] Patient Account Number: [id number] Date of Birth/Sex: [birth year] (53 y.o. M) Treating RN: Curtis Sites Primary Care Matt Delpizzo: Marisue Ivan Other Clinician: Referring Fenna Semel: Marisue Ivan Treating Chevy Sweigert/Extender: Altamese [place] in Treatment: 0 Height (in): Weight (lbs): 286.6 Body Mass Index (BMI): Nutrition Risk Assessment Items NUTRITION RISK SCREEN: I have an illness or condition that made me change the kind and/or amount of 0 No food I eat I eat fewer than two meals per day 0 No I eat few fruits and vegetables, or milk products 0 No I have three or more drinks of beer, liquor or wine almost every day 0 No I have tooth or mouth problems that make it hard for me to eat 0 No I don't always have enough money to buy the food I need 0 No I eat alone most of the time 0 No I take three or more different prescribed or over-the-counter drugs a day 1 Yes Without wanting to, I have lost or gained 10 pounds in the last six months 0 No I am not always physically able to shop, cook and/or feed myself 0 No Nutrition Protocols Good Risk Protocol 0 No interventions needed Moderate Risk Protocol Electronic Signature(s) Signed: 11/30/2018 5:36:16 PM By: Curtis Sites Entered By: Curtis Sites on 11/30/2018 09:11:36
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
## 2018-12-06 ENCOUNTER — Encounter: Payer: Self-pay | Admitting: Physical Therapy

## 2018-12-06 ENCOUNTER — Ambulatory Visit: Payer: Medicare Other | Admitting: Physical Therapy

## 2018-12-06 ENCOUNTER — Ambulatory Visit: Payer: Self-pay

## 2018-12-06 VITALS — BP 162/84 | HR 86

## 2018-12-06 DIAGNOSIS — M6281 Muscle weakness (generalized): Secondary | ICD-10-CM

## 2018-12-06 DIAGNOSIS — R262 Difficulty in walking, not elsewhere classified: Secondary | ICD-10-CM

## 2018-12-06 DIAGNOSIS — R296 Repeated falls: Secondary | ICD-10-CM | POA: Diagnosis not present

## 2018-12-06 DIAGNOSIS — R2689 Other abnormalities of gait and mobility: Secondary | ICD-10-CM

## 2018-12-06 DIAGNOSIS — R209 Unspecified disturbances of skin sensation: Secondary | ICD-10-CM

## 2018-12-06 DIAGNOSIS — R609 Edema, unspecified: Secondary | ICD-10-CM

## 2018-12-06 NOTE — Progress Notes (Signed)
Shawn, Rivas ([id number]) Visit Report for 11/30/2018 HPI Details Patient Name: Shawn Rivas, Shawn Rivas. Date of Service: 11/30/2018 8:45 AM Medical Record Number: [id number] Patient Account Number: [id number] Date of Birth/Sex: 01/28/1966 (53 y.o. M) Treating RN: Huel Coventry Primary Care Provider: Marisue Ivan Other Clinician: Referring Provider: Marisue Ivan Treating Provider/Extender: Altamese [place] in Treatment: 0 History of Present Illness HPI Description: ADMISSION 11/30/2018 This is a 53 year old man with type 2 diabetes. He is here for review of 2 small areas on his left anterior tibial area. He is upset because his primary doctor would not give him antibiotics for presumed cellulitis. He states that he has been dealing with this for 20 years and he knows when he has cellulitis. These apparently started off his blisters and he is seen at the therapy department on [facility] in [place] apparently gave him some Tubigrip stockings and these helped. He tells [place] he is not able to put on standard compression stockings. He lives with his 71 year old daughter and does not have any other assistance. He had ORIF. He was seen by Liz Beach of podiatry in October 2009 with a skin ulcer on his left foot and left second toe these apparently have closed. His history is complicated by trauma he suffered in 2000. Apparently resulting in a distal femur fracture, fractures in his left ankle and perhaps his left hip. ORIF in both areas He said in passing he has had a left hip replacement. I have not reviewed these records if they are availabel available. He states that he was nonambulatory for 6 years after this but has been able to get around for the most part. He is insensate from just below his ankle down and has chronic edema in the left leg. 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 M) Treating RN: Huel Coventry Primary Care Provider: Marisue Ivan Other Clinician: Referring Provider: Marisue Ivan Treating Provider/Extender: Altamese [place] in Treatment: 0 Constitutional Patient is hypertensive.. Pulse regular and within target range for patient.[name] Respirations regular, non-labored and within target range.. Temperature is normal and within the target range for the patient.[name] appears in no distress. Eyes Conjunctivae clear. No discharge. Respiratory Respiratory effort is easy and symmetric bilaterally. Rate is normal at rest and on room air.. Bilateral breath sounds are clear and equal in all lobes with no wheezes, rales or rhonchi.. Cardiovascular Heart rhythm and rate regular, without murmur or gallop.No signs of CHF. Lymphatic None palpable in the popliteal area. Musculoskeletal Left ankle is fused. He had surgery on his left knee and/or distal femur.. Integumentary (Hair, Skin) The patient has what looks to be chronic stasis dermatitis in the left anterior lower leg. There is no warmth. Neurological He is insensate for a large area below the left knee apparently related to trauma. Psychiatric No evidence of depression, anxiety, or agitation. Calm, cooperative, and communicative. Appropriate interactions and affect.. Notes Wound exam; the patient has 2 small open areas which were initially blisters on his  left anterior tibial  area. There is surrounding light pink erythema but absolutely normal warmth. He is not tender but then again he is insensate. These look fairly benign and look like they are closing on their own. He was given Tubigrip at the physical therapy clinic he attends in [place] but he has not worn this in several days out of fear that he has cellulitis. Electronic Signature(s) Signed: 11/30/2018 6:18:32 PM By: Baltazar Najjar MD Entered By: Baltazar Najjar on 11/30/2018 10:17:09 Shawn Rivas ([id number]) -------------------------------------------------------------------------------- Physician Orders Details Patient Name: Shawn Rivas Date of Service: 11/30/2018 8:45 AM Medical Record Number: [id number] Patient Account Number: [id number] Date of Birth/Sex: 06/23/1966 (53 y.o. M) Treating RN: Arnette Norris Primary Care Provider: Marisue Ivan Other Clinician: Referring Provider: Marisue Ivan Treating Provider/Extender: Altamese [place] in Treatment: 0 Verbal / Phone Orders: No Diagnosis Coding Primary Wound Dressing Wound #1 Left,Anterior Lower Leg o Silver Alginate Secondary Dressing Wound #1 Left,Anterior Lower Leg o Dry Gauze Follow-up Appointments Wound #1 Left,Anterior Lower Leg o Return Appointment in 1 week. Edema Control Wound #1 Left,Anterior Lower Leg o 3 Layer Compression System - Left Lower Extremity Off-Loading Wound #1 Left,Anterior Lower Leg o Other: - Elevate leg when sitting. Electronic Signature(s) Signed: 11/30/2018 6:18:32 PM By: Baltazar Najjar MD Signed: 12/06/2018 11:13:25 AM By: Arnette Norris Entered By: Arnette Norris on 11/30/2018 09:50:00 Shawn Rivas ([id number]) -------------------------------------------------------------------------------- Problem List Details Patient Name: Shawn, Rivas. Date of Service: 11/30/2018 8:45 AM Medical Record Number: [id number] Patient Account Number:  [id number] Date of Birth/Sex: June 30, 1966 (53 y.o. 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 of 11/30/2018 No Yes skin I89.0 Lymphedema, not elsewhere classified 11/30/2018 No Yes E11.622 Type 2 diabetes mellitus with other skin ulcer 11/30/2018 No Yes Inactive Problems Resolved Problems Electronic Signature(s) Signed: 11/30/2018 6:18:32 PM By: Baltazar Najjar MD Entered By: Baltazar Najjar on 11/30/2018 10:21:48 Shawn Rivas ([id number]) -------------------------------------------------------------------------------- Progress Note Details Patient Name: Shawn Rivas Date of Service: 11/30/2018 8:45 AM Medical Record Number: [id number] Patient Account Number: [id number] Date of Birth/Sex: February 03, 1966 (53 y.o. M) Treating RN: Huel Coventry Primary Care Provider: Marisue Ivan Other Clinician: Referring Provider: Marisue Ivan Treating Provider/Extender: Altamese [place] in Treatment: 0 Subjective History of Present Illness (HPI) ADMISSION 11/30/2018 This is a 53 year old man with type 2 diabetes. He is here for review of 2 small areas on his left anterior tibial area. He is upset because his primary doctor would not give him antibiotics for presumed cellulitis. He states that he has been dealing with this for 20 years and he knows when he has cellulitis. These apparently started off his blisters and he is seen at the therapy department on [facility] in [place] apparently gave him some Tubigrip stockings and these helped. He tells [place] he is not able to put on standard compression stockings. He lives with his 50 year old daughter and does not have any other  assistance. He had ORIF. He was seen by Liz Beach of podiatry in October 2009 with a skin ulcer on his left foot and left second toe these apparently have closed. His history is complicated by trauma he suffered in 2000. Apparently resulting in a distal femur fracture, fractures in his left ankle and perhaps his left hip. ORIF in both areas He said  in passing he has had a left hip replacement. I have not reviewed these records if they are availabel available. He states that he was nonambulatory for 6 years after this but has been able to get around for the most part. He is insensate from just below his ankle down and has chronic edema in the left leg. 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 Patient has been treating wounds in the following manner: open to air. Laboratory tests have not been performed in the last month. Patient reportedly has not tested positive for an antibiotic resistant organism. Patient reportedly has not tested positive for osteomyelitis. Patient reportedly has not had testing performed to evaluate circulation in the legs. Patient experiences the following problems associated with their wounds: swelling. Patient History Information obtained from Patient. Allergies Dilaudid, tb skin test Family History Cancer - Maternal Grandparents,Mother, No family history of Diabetes, Heart Disease, Hereditary Spherocytosis, Hypertension, Kidney Disease, Lung Disease, Seizures, Stroke, Thyroid Problems, Tuberculosis. Social History Never smoker - 8 years, Marital Status - Single, Alcohol Use - Never, Drug Use - No History, Caffeine Use - Daily. Medical History Eyes KHOA, OPDAHL  ([id number]) Denies history of Cataracts, Glaucoma, Optic Neuritis Ear/Nose/Mouth/Throat Denies history of Chronic sinus problems/congestion, Middle ear problems Hematologic/Lymphatic Denies history of Anemia, Hemophilia, Human Immunodeficiency Virus, Lymphedema, Sickle Cell Disease Respiratory Denies history of Aspiration, Asthma, Chronic Obstructive Pulmonary Disease (COPD), Pneumothorax, Sleep Apnea, Tuberculosis Cardiovascular Patient has history of Hypertension Denies history of Angina, Arrhythmia, Congestive Heart Failure, Coronary Artery Disease, Deep Vein Thrombosis, Hypotension, Myocardial Infarction, Peripheral Arterial Disease, Peripheral Venous Disease, Phlebitis, Vasculitis Gastrointestinal Denies history of Cirrhosis , Colitis, Crohn s, Hepatitis A, Hepatitis B, Hepatitis C Endocrine Patient has history of Type II Diabetes Denies history of Type I Diabetes Genitourinary Denies history of End Stage Renal Disease Immunological Denies history of Lupus Erythematosus, Raynaud s, Scleroderma Integumentary (Skin) Denies history of History of Burn, History of pressure wounds Musculoskeletal Patient has history of Osteoarthritis Denies history of Gout, Rheumatoid Arthritis, Osteomyelitis Neurologic Patient has history of Neuropathy Denies history of Dementia, Quadriplegia, Paraplegia, Seizure Disorder Oncologic Denies history of Received Chemotherapy, Received Radiation Psychiatric Denies history of Anorexia/bulimia, Confinement Anxiety Patient is treated with Insulin, Oral Agents. Medical And Surgical History Notes Respiratory scar tissue from aspiration Cardiovascular HLD Review of Systems (ROS) Constitutional Symptoms (General Health) Denies complaints or symptoms of Fatigue, Fever, Chills, Marked Weight Change. Eyes Denies complaints or symptoms of Dry Eyes, Vision Changes, Glasses / Contacts. Ear/Nose/Mouth/Throat Denies complaints or symptoms of Difficult  clearing ears, Sinusitis. Hematologic/Lymphatic Denies complaints or symptoms of Bleeding / Clotting Disorders, Human Immunodeficiency Virus. Respiratory Denies complaints or symptoms of Chronic or frequent coughs, Shortness of Breath. Cardiovascular Complains or has symptoms of LE edema. Denies complaints or symptoms of Chest pain. Gastrointestinal Denies complaints or symptoms of Frequent diarrhea, Nausea, Vomiting. Endocrine Denies complaints or symptoms of Hepatitis, Thyroid disease, Polydypsia (Excessive Thirst). BARRETT, GOLDIE ([id number]) Genitourinary Denies complaints or symptoms of Kidney failure/ Dialysis, Incontinence/dribbling. Immunological Denies complaints or symptoms of Hives, Itching. Integumentary (Skin) Complains or has symptoms of Wounds, Swelling. Denies complaints or symptoms of Bleeding or bruising tendency, Breakdown. Musculoskeletal Denies complaints or symptoms of Muscle Pain, Muscle Weakness. Neurologic  Denies complaints or symptoms of Numbness/parasthesias, Focal/Weakness. Psychiatric Denies complaints or symptoms of Anxiety, Claustrophobia. Objective Constitutional Patient is hypertensive.. Pulse regular and within target range for patient.[name] Respirations regular, non-labored and within target range.. Temperature is normal and within the target range for the patient.[name] appears in no distress. Vitals Time Taken: 9:05 AM, Weight: 286.6 lbs, Temperature: 97.6 F, Pulse: 91 bpm, Respiratory Rate: 16 breaths/min, Blood Pressure: 177/94 mmHg. Eyes Conjunctivae clear. No discharge. Respiratory Respiratory effort is easy and symmetric bilaterally. Rate is normal at rest and on room air.. Bilateral breath sounds are clear and equal in all lobes with no wheezes, rales or rhonchi.. Cardiovascular Heart rhythm and rate regular, without murmur or gallop.No signs of CHF. Lymphatic None palpable in the popliteal area. Musculoskeletal Left ankle is fused. He  had surgery on his left knee and/or distal femur.. Neurological He is insensate for a large area below the left knee apparently related to trauma. Psychiatric No evidence of depression, anxiety, or agitation. Calm, cooperative, and communicative. Appropriate interactions and affect.. General Notes: Wound exam; the patient has 2 small open areas which were initially blisters on his left anterior tibial area. There is surrounding light pink erythema but absolutely normal warmth. He is not tender but then again he is insensate. These look fairly benign and look like they are closing on their own. He was given Tubigrip at the physical therapy clinic he attends in [place] but he has not worn this in several days out of fear that he has cellulitis. JAMAUL, HEIST ([id number]) Integumentary (Hair, Skin) The patient has what looks to be chronic stasis dermatitis in the left anterior lower leg. There is no warmth. Wound #1 status is Open. Original cause of wound was Blister. The wound is located on the Left,Anterior Lower Leg. The wound measures 0.9cm length x 2cm width x 0.1cm depth; 1.414cm^2 area and 0.141cm^3 volume. There is Fat Layer (Subcutaneous Tissue) Exposed exposed. There is no tunneling or undermining noted. There is a medium amount of serous drainage noted. The wound margin is flat and intact. There is no granulation within the wound bed. There is a large (67-100%) amount of necrotic tissue within the wound bed including Eschar. The periwound skin appearance exhibited: Hemosiderin Staining, Erythema. The periwound skin appearance did not exhibit: Callus, Crepitus, Excoriation, Induration, Rash, Scarring, Dry/Scaly, Maceration, Atrophie Blanche, Cyanosis, Ecchymosis, Mottled, Pallor, Rubor. The surrounding wound skin color is noted with erythema which is circumferential. Periwound temperature was noted as No Abnormality. The periwound has tenderness on palpation. Assessment Active  Problems ICD-10 Non-pressure chronic ulcer of left calf limited to breakdown of skin Lymphedema, not elsewhere classified Type 2 diabetes mellitus with other skin ulcer Diagnoses ICD-10 L97.221: Non-pressure chronic ulcer of left calf limited to breakdown of skin I89.0: Lymphedema, not elsewhere classified Plan Primary Wound Dressing: Wound #1 Left,Anterior Lower Leg: Silver Alginate Secondary Dressing: Wound #1 Left,Anterior Lower Leg: Dry Gauze Follow-up Appointments: Wound #1 Left,Anterior Lower Leg: Return Appointment in 1 week. Edema Control: Wound #1 Left,Anterior Lower Leg: 3 Layer Compression System - Left Lower Extremity Off-Loading: Wound #1 Left,Anterior Lower Leg: Other: - Elevate leg when sitting. 1. I think this patient has lymphedema secondary to trauma. There is no pitting in the left leg. 2. Probably chronic venous inflammation/stasis dermatitis related to BREYSON, KELM. ([id number]) 3 I had some difficulty talking the patient out of the idea that he has cellulitis. I am not debating that this set up in the left leg is not a  risk factor for cellulitis I am simply saying he does not have it now and I did not think he required antibiotics. 4. What he requires his compression and I was concerned enough about the swelling in his left leg and perhaps the benefit of showing him the benefit of compression to wrap his leg in 3 layer compression this week. 5 I thought I had him talked into external compression garments i.e. juxta lite stockings but he seems perturbed about the sleeve even though he can put Tubigrip stockings on himself. I will see how this looks next week have another talk with him but if he does not feel that there is stockings can work for him then I do not know what else to do except refer him back for ongoing lymphedema compression in therapy Electronic Signature(s) Signed: 11/30/2018 10:22:08 AM By: Baltazar Najjar MD Entered By: Baltazar Najjar on  11/30/2018 10:22:08 Shawn Rivas ([id number]) -------------------------------------------------------------------------------- ROS/PFSH Details Patient Name: Shawn Rivas. Date of Service: 11/30/2018 8:45 AM Medical Record Number: [id number] Patient Account Number: [id number] Date of Birth/Sex: [birth year] (53 y.o. M) Treating RN: Curtis Sites Primary Care Provider: Marisue Ivan Other Clinician: Referring Provider: Marisue Ivan Treating Provider/Extender: Altamese [place] in Treatment: 0 Information Obtained From Patient Wound History Do you currently have one or more open woundso Yes How many open wounds do you currently haveo 2 Approximately how long have you had your woundso 1 month How have you been treating your wound(s) until nowo open to air Has your wound(s) ever healed and then re-openedo No Have you had any lab work done in the past montho No Have you tested positive for an antibiotic resistant organism (MRSA, VRE)o No Have you tested positive for osteomyelitis (bone infection)o No Have you had any tests for circulation on your legso No Have you had other problems associated with your woundso Swelling Constitutional Symptoms (General Health) Complaints and Symptoms: Negative for: Fatigue; Fever; Chills; Marked Weight Change Eyes Complaints and Symptoms: Negative for: Dry Eyes; Vision Changes; Glasses / Contacts Medical History: Negative for: Cataracts; Glaucoma; Optic Neuritis Ear/Nose/Mouth/Throat Complaints and Symptoms: Negative for: Difficult clearing ears; Sinusitis Medical History: Negative for: Chronic sinus problems/congestion; Middle ear problems Hematologic/Lymphatic Complaints and Symptoms: Negative for: Bleeding / Clotting Disorders; Human Immunodeficiency Virus Medical History: Negative for: Anemia; Hemophilia; Human Immunodeficiency Virus; Lymphedema; Sickle Cell Disease Respiratory Complaints and Symptoms: Negative for:  Chronic or frequent coughs; Shortness of Breath Medical HistoryMarland Kitchen USAMA, HARKLESS ([id number]) Negative for: Aspiration; Asthma; Chronic Obstructive Pulmonary Disease (COPD); Pneumothorax; Sleep Apnea; Tuberculosis Past Medical History Notes: scar tissue from aspiration Cardiovascular Complaints and Symptoms: Positive for: LE edema Negative for: Chest pain Medical History: Positive for: Hypertension Negative for: Angina; Arrhythmia; Congestive Heart Failure; Coronary Artery Disease; Deep Vein Thrombosis; Hypotension; Myocardial Infarction; Peripheral Arterial Disease; Peripheral Venous Disease; Phlebitis; Vasculitis Past Medical History Notes: HLD Gastrointestinal Complaints and Symptoms: Negative for: Frequent diarrhea; Nausea; Vomiting Medical History: Negative for: Cirrhosis ; Colitis; Crohnos; Hepatitis A; Hepatitis B; Hepatitis C Endocrine Complaints and Symptoms: Negative for: Hepatitis; Thyroid disease; Polydypsia (Excessive Thirst) Medical History: Positive for: Type II Diabetes Negative for: Type I Diabetes Treated with: Insulin, Oral agents Genitourinary Complaints and Symptoms: Negative for: Kidney failure/ Dialysis; Incontinence/dribbling Medical History: Negative for: End Stage Renal Disease Immunological Complaints and Symptoms: Negative for: Hives; Itching Medical History: Negative for: Lupus Erythematosus; Raynaudos; Scleroderma Integumentary (Skin) Complaints and Symptoms: Positive for: Wounds; Swelling Negative for: Bleeding or bruising tendency; Breakdown Medical History: LAKER, THOMPSON. (  [id number]) Negative for: History of Burn; History of pressure wounds Musculoskeletal Complaints and Symptoms: Negative for: Muscle Pain; Muscle Weakness Medical History: Positive for: Osteoarthritis Negative for: Gout; Rheumatoid Arthritis; Osteomyelitis Neurologic Complaints and Symptoms: Negative for: Numbness/parasthesias; Focal/Weakness Medical  History: Positive for: Neuropathy Negative for: Dementia; Quadriplegia; Paraplegia; Seizure Disorder Psychiatric Complaints and Symptoms: Negative for: Anxiety; Claustrophobia Medical History: Negative for: Anorexia/bulimia; Confinement Anxiety Oncologic Medical History: Negative for: Received Chemotherapy; Received Radiation Immunizations Pneumococcal Vaccine: Received Pneumococcal Vaccination: No Implantable Devices Family and Social History Cancer: Yes - Maternal Grandparents,Mother; Diabetes: No; Heart Disease: No; Hereditary Spherocytosis: No; Hypertension: No; Kidney Disease: No; Lung Disease: No; Seizures: No; Stroke: No; Thyroid Problems: No; Tuberculosis: No; Never smoker - 8 years; Marital Status - Single; Alcohol Use: Never; Drug Use: No History; Caffeine Use: Daily; Financial Concerns: No; Food, Clothing or Shelter Needs: No; Support System Lacking: No; Transportation Concerns: No; Advanced Directives: No; Patient does not want information on Advanced Directives Electronic Signature(s) Signed: 11/30/2018 5:36:16 PM By: Curtis Sites Signed: 11/30/2018 6:18:32 PM By: Baltazar Najjar MD Entered By: Curtis Sites on 11/30/2018 09:17:35 Shawn Rivas ([id number]) -------------------------------------------------------------------------------- SuperBill Details Patient Name: Shawn Rivas. Date of Service: 11/30/2018 Medical Record Number: [id number] Patient Account Number: [id number] Date of Birth/Sex: 01-Jan-1966 (53 y.o. M) Treating RN: Huel Coventry Primary Care Provider: Marisue Ivan Other Clinician: Referring Provider: Marisue Ivan Treating Provider/Extender: Altamese [place] in Treatment: 0 Diagnosis Coding ICD-10 Codes Code Description [phone] Non-pressure chronic ulcer of left calf limited to breakdown of skin I89.0 Lymphedema, not elsewhere classified Facility Procedures CPT4 Code: [id number] Description: 99213 - WOUND CARE VISIT-LEV 3 EST  PT Modifier: Quantity: 1 Physician Procedures CPT4 Code: [id number] Description: WC PHYS LEVEL 3 o NEW PT ICD-10 Diagnosis Description L97.221 Non-pressure chronic ulcer of left calf limited to breakdo I89.0 Lymphedema, not elsewhere classified Modifier: wn of skin Quantity: 1 Electronic Signature(s) Signed: 11/30/2018 6:18:32 PM By: Baltazar Najjar MD Entered By: Baltazar Najjar on 11/30/2018 10:20:28

## 2018-12-06 NOTE — Progress Notes (Signed)
Shawn Rivas ([id number]) Visit Report for 11/30/2018 Allergy List Details Patient Name: Shawn Rivas, Shawn Rivas. Date of Service: 11/30/2018 8:45 AM Medical Record Number: [id number] Patient Account Number: [id number] Date of Birth/Sex: 1966-01-14 (53 y.o. M) Treating RN: Curtis Sites Primary Care Rudie Sermons: Marisue Ivan Other Clinician: Referring Dillyn Menna: Marisue Ivan Treating Roark Rufo/Extender: Maxwell Caul Weeks in Treatment: 0 Allergies Active Allergies Dilaudid tb skin test Allergy Notes Electronic Signature(s) Signed: 11/30/2018 5:36:16 PM By: Curtis Sites Entered By: Curtis Sites on 11/30/2018 09:09:10 Shawn Rivas ([id number]) -------------------------------------------------------------------------------- Arrival Information Details Patient Name: Shawn Rivas Date of Service: 11/30/2018 8:45 AM Medical Record Number: [id number] Patient Account Number: [id number] Date of Birth/Sex: 11-02-1965 (53 y.o. M) Treating RN: Huel Coventry Primary Care Emalee Knies: Marisue Ivan Other Clinician: Referring Dijon Cosens: Marisue Ivan Treating Imagene Boss/Extender: Altamese [place] in Treatment: 0 Visit Information Patient Arrived: Crutches Arrival Time: 09:04 Accompanied By: self Transfer Assistance: None Patient Identification Verified: Yes Secondary Verification Process Completed: Yes Electronic Signature(s) Signed: 11/30/2018 2:49:55 PM By: Dayton Martes RCP, RRT, CHT Entered By: Dayton Martes on 11/30/2018 09:04:26 Shawn Rivas ([id number]) -------------------------------------------------------------------------------- Clinic Level of Care Assessment Details Patient Name: Shawn Rivas Date of Service: 11/30/2018 8:45 AM Medical Record Number: [id number] Patient Account Number: [id number] Date of Birth/Sex: 27-Jan-1966 (53 y.o. M) Treating RN: Arnette Norris Primary Care Urbano Milhouse: Marisue Ivan Other  Clinician: Referring Emmani Lesueur: Marisue Ivan Treating Marvella Jenning/Extender: Altamese [place] in Treatment: 0 Clinic Level of Care Assessment Items TOOL 4 Quantity Score []  - Use when only an EandM is performed on FOLLOW-UP visit 0 ASSESSMENTS - Nursing Assessment / Reassessment X - Reassessment of Co-morbidities (includes updates in patient status) 1 10 X- 1 5 Reassessment of Adherence to Treatment Plan ASSESSMENTS - Wound and Skin Assessment / Reassessment X - Simple Wound Assessment / Reassessment - one wound 1 5 []  - 0 Complex Wound Assessment / Reassessment - multiple wounds []  - 0 Dermatologic / Skin Assessment (not related to wound area) ASSESSMENTS - Focused Assessment X - Circumferential Edema Measurements - multi extremities 1 5 []  - 0 Nutritional Assessment / Counseling / Intervention []  - 0 Lower Extremity Assessment (monofilament, tuning fork, pulses) []  - 0 Peripheral Arterial Disease Assessment (using hand held doppler) ASSESSMENTS - Ostomy and/or Continence Assessment and Care []  - Incontinence Assessment and Management 0 []  - 0 Ostomy Care Assessment and Management (repouching, etc.) PROCESS - Coordination of Care X - Simple Patient / Family Education for ongoing care 1 15 []  - 0 Complex (extensive) Patient / Family Education for ongoing care X- 1 10 Staff obtains Chiropractor, Records, Test Results / Process Orders X- 1 10 Staff telephones HHA, Nursing Homes / Clarify orders / etc []  - 0 Routine Transfer to another Facility (non-emergent condition) []  - 0 Routine Hospital Admission (non-emergent condition) X- 1 15 New Admissions / Manufacturing engineer / Ordering NPWT, Apligraf, etc. []  - 0 Emergency Hospital Admission (emergent condition) X- 1 10 Simple Discharge Coordination Shawn Rivas ([id number]) []  - 0 Complex (extensive) Discharge Coordination PROCESS - Special Needs []  - Pediatric / Minor Patient Management 0 []  - 0 Isolation  Patient Management []  - 0 Hearing / Language / Visual special needs []  - 0 Assessment of Community assistance (transportation, D/C planning, etc.) []  - 0 Additional assistance / Altered mentation []  - 0 Support Surface(s) Assessment (bed, cushion, seat, etc.) INTERVENTIONS - Wound Cleansing / Measurement X - Simple Wound Cleansing - one wound 1 5 []  - 0 Complex  Wound Cleansing - multiple wounds X- 1 5 Wound Imaging (photographs - any number of wounds)  - 0 Wound Tracing (instead of photographs) X- 1 5 Simple Wound Measurement - one wound  - 0 Complex Wound Measurement - multiple wounds INTERVENTIONS - Wound Dressings X - Small Wound Dressing one or multiple wounds 1 10  - 0 Medium Wound Dressing one or multiple wounds  - 0 Large Wound Dressing one or multiple wounds  - 0 Application of Medications - topical  - 0 Application of Medications - injection INTERVENTIONS - Miscellaneous  - External ear exam 0  - 0 Specimen Collection (cultures, biopsies, blood, body fluids, etc.)  - 0 Specimen(s) / Culture(s) sent or taken to Lab for analysis  - 0 Patient Transfer (multiple staff / Nurse, adult / Similar devices)  - 0 Simple Staple / Suture removal (25 or less)  - 0 Complex Staple / Suture removal (26 or more)  - 0 Hypo / Hyperglycemic Management (close monitor of Blood Glucose)  - 0 Ankle / Brachial Index (ABI) - do not check if billed separately X- 1 5 Vital Signs Shawn Rivas ([id number]) Has the patient been seen at the hospital within the last three years: Yes Total Score: 115 Level Of Care: New/Established - Level 3 Electronic Signature(s) Signed: 12/06/2018 11:13:25 AM By: Arnette Norris Entered By: Arnette Norris on 11/30/2018 09:39:46 Shawn Rivas ([id number]) -------------------------------------------------------------------------------- Encounter Discharge Information Details Patient Name: Shawn Rivas Date of  Service: 11/30/2018 8:45 AM Medical Record Number: [id number] Patient Account Number: [id number] Date of Birth/Sex: 1965/12/27 (53 y.o. M) Treating RN: Curtis Sites Primary Care Jerry Clyne: Marisue Ivan Other Clinician: Referring Evalynn Hankins: Marisue Ivan Treating Skylar Flynt/Extender: Altamese [place] in Treatment: 0 Encounter Discharge Information Items Discharge Condition: Stable Ambulatory Status: Crutches Discharge Destination: Home Transportation: Private Auto Accompanied By: self Schedule Follow-up Appointment: Yes Clinical Summary of Care: Electronic Signature(s) Signed: 11/30/2018 11:24:05 AM By: Curtis Sites Entered By: Curtis Sites on 11/30/2018 11:24:05 Shawn Rivas ([id number]) -------------------------------------------------------------------------------- Lower Extremity Assessment Details Patient Name: Shawn Rivas Date of Service: 11/30/2018 8:45 AM Medical Record Number: [id number] Patient Account Number: [id number] Date of Birth/Sex: 11/29/1965 (53 y.o. M) Treating RN: Curtis Sites Primary Care Kalan Rinn: Marisue Ivan Other Clinician: Referring Mouhamed Glassco: Marisue Ivan Treating Claxton Levitz/Extender: Altamese [place] in Treatment: 0 Edema Assessment Assessed: [Left: No] [Right: No] Edema: [Left: Yes] [Right: Yes] Calf Left: Right: Point of Measurement: 34 cm From Medial Instep 44 cm 39 cm Ankle Left: Right: Point of Measurement: 14 cm From Medial Instep 30.5 cm 24 cm Vascular Assessment Pulses: Dorsalis Pedis Palpable: [Left:Yes] [Right:Yes] Doppler Audible: [Left:Yes] [Right:Yes] Posterior Tibial Palpable: [Left:No] [Right:No] Doppler Audible: [Left:Yes] [Right:Yes] Extremity colors, hair growth, and conditions: Extremity Color: [Left:Hyperpigmented] [Right:Normal] Hair Growth on Extremity: [Left:No] [Right:No] Temperature of Extremity: [Left:Cool] [Right:Cool] Capillary Refill: [Left:> 3 seconds] [Right:> 3  seconds] Blood Pressure: Brachial: [Left:180] Dorsalis Pedis: 50 [Left:Dorsalis Pedis:] Ankle: Posterior Tibial: 200 [Left:Posterior Tibial: 1.11] Toe Nail Assessment Left: Right: Thick: No No Discolored: No No Deformed: No No Improper Length and Hygiene: No No Notes ABI [place] RIGHT >220 Electronic Signature(s) Signed: 11/30/2018 5:36:16 PM By: Mechele Collin ([id number]) Entered By: Curtis Sites on 11/30/2018 09:22:32 Shawn Rivas ([id number]) -------------------------------------------------------------------------------- Multi Wound Chart Details Patient Name: Shawn Rivas Date of Service: 11/30/2018 8:45 AM Medical Record Number: [id number] Patient Account Number: [id number] Date of Birth/Sex: [birth year] (53 y.o. 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 N/A N/A Comorbid History: Hypertension, Type II N/A N/A Diabetes, Osteoarthritis, Neuropathy Date Acquired: 10/25/2018 N/A N/A Weeks of Treatment: 0 N/A N/A Wound Status: Open N/A N/A Measurements L x W x D 0.9x2x0.1 N/A N/A (cm) Area (cm) : 1.414 N/A N/A Volume (cm) : 0.141 N/A N/A Classification: Grade 1 N/A N/A Exudate Amount: Medium N/A N/A Exudate Type: Serous N/A N/A Exudate Color: amber N/A N/A Wound Margin: Flat and Intact N/A N/A Granulation Amount: None Present (0%) N/A N/A Necrotic Amount: Large (67-100%) N/A N/A Necrotic Tissue: Eschar N/A  N/A Exposed Structures: Fat Layer (Subcutaneous N/A N/A Tissue) Exposed: Yes Fascia: No Tendon: No Muscle: No ADDISON, WHIDBEE ([id number]) Joint: No Bone: No Epithelialization: Medium (34-66%) N/A N/A Periwound Skin Texture: Excoriation: No N/A N/A Induration: No Callus: No Crepitus: No Rash: No Scarring: No Periwound Skin Moisture: Maceration: No N/A N/A Dry/Scaly: No Periwound Skin Color: Erythema: Yes N/A N/A Hemosiderin Staining: Yes Atrophie Blanche: No Cyanosis: No Ecchymosis: No Mottled: No Pallor: No Rubor: No Erythema Location: Circumferential N/A N/A Temperature: No Abnormality N/A N/A Tenderness on Palpation: Yes N/A N/A Wound Preparation: Ulcer Cleansing: N/A N/A Rinsed/Irrigated with Saline Topical Anesthetic Applied: Other: lidocaine 4% Treatment Notes Electronic Signature(s) Signed: 11/30/2018 6:18:32 PM By: Baltazar Najjar MD Entered By: Baltazar Najjar on 11/30/2018 09:57:54 Shawn Rivas ([id number]) -------------------------------------------------------------------------------- Multi-Disciplinary Care Plan Details Patient Name: KENARD, MORAWSKI. Date of Service: 11/30/2018 8:45 AM Medical Record Number: [id number] Patient Account Number: [id number] Date of Birth/Sex: 03/25/1966 (53 y.o. M) Treating RN: Arnette Norris Primary Care Artemis Koller: Marisue Ivan Other Clinician: Referring Shion Bluestein: Marisue Ivan Treating Jheremy Boger/Extender: Altamese [place] in Treatment: 0 Active Inactive Wound/Skin Impairment Nursing Diagnoses: Impaired tissue integrity Knowledge deficit related to ulceration/compromised skin integrity Goals: Ulcer/skin breakdown will have a volume reduction of 30% by week 4 Date Initiated: 11/30/2018 Target Resolution Date: 12/29/2018 Goal Status: Active Interventions: Assess patient/caregiver ability to obtain necessary supplies Assess patient/caregiver ability to perform ulcer/skin care regimen upon  admission and as needed Assess ulceration(s) every visit Provide education on ulcer and skin care Notes: Electronic Signature(s) Signed: 12/06/2018 11:13:25 AM By: Arnette Norris Entered By: Arnette Norris on 11/30/2018 09:37:40 Shawn Rivas ([id number]) -------------------------------------------------------------------------------- Pain Assessment Details Patient Name: Shawn Rivas Date of Service: 11/30/2018 8:45 AM Medical Record Number: [id number] Patient Account Number: [id number] Date of Birth/Sex: 1966/05/04 (53 y.o. M) Treating RN: Huel Coventry Primary Care Marcquis Ridlon: Marisue Ivan Other Clinician: Referring Durelle Zepeda: Marisue Ivan Treating Oksana Deberry/Extender: Altamese [place] in Treatment: 0 Active Problems Location of Pain Severity and Description of Pain Patient Has Paino No Site Locations Pain Management and Medication Current Pain Management: Electronic Signature(s) Signed: 11/30/2018 2:49:55 PM By: Dayton Martes RCP, RRT, CHT Signed: 11/30/2018 6:42:35 PM By: Elliot Gurney, BSN, RN, CWS, Kim RN, BSN Entered By: Dayton Martes on 11/30/2018 09:05:52 Shawn Rivas ([id number]) -------------------------------------------------------------------------------- Patient/Caregiver Education Details Patient Name: Shawn Rivas Date of Service: 11/30/2018 8:45 AM Medical Record Number: [id number] Patient Account Number: [id number] Date of Birth/Gender: 09/20/1966 (53 y.o. M) Treating RN: Arnette Norris Primary Care Physician: Marisue Ivan Other Clinician: Referring Physician: Marisue Ivan Treating Physician/Extender: Leanord Hawking  MICHAEL G Weeks in Treatment: 0 Education Assessment Education Provided To: Patient Education Topics Provided Wound/Skin Impairment: Handouts: Caring for Your Ulcer Methods: Demonstration, Explain/Verbal Responses: State content correctly Electronic Signature(s) Signed: 12/06/2018 11:13:25  AM By: Arnette Norris Entered By: Arnette Norris on 11/30/2018 09:38:13 Shawn Rivas ([id number]) -------------------------------------------------------------------------------- Wound Assessment Details Patient Name: Shawn Rivas. Date of Service: 11/30/2018 8:45 AM Medical Record Number: [id number] Patient Account Number: [id number] Date of Birth/Sex: 08/22/1966 (53 y.o. M) Treating RN: Curtis Sites Primary Care Tory Septer: Marisue Ivan Other Clinician: Referring Trase Bunda: Marisue Ivan Treating Raheim Beutler/Extender: Altamese [place] in Treatment: 0 Wound Status Wound Number: 1 Primary Etiology: Diabetic Wound/Ulcer of the Lower Extremity Wound Location: Left Lower Leg - Anterior Secondary Venous Leg Ulcer Wounding Event: Blister Etiology: Date Acquired: 10/25/2018 Wound Status: Open Weeks Of Treatment: 0 Comorbid Hypertension, Type II Diabetes, Clustered Wound: No History: Osteoarthritis, Neuropathy Photos Photo Uploaded By: Curtis Sites on 11/30/2018 09:39:55 Wound Measurements Length: (cm) 0.9 Width: (cm) 2 Depth: (cm) 0.1 Area: (cm) 1.414 Volume: (cm) 0.141 % Reduction in Area: % Reduction in Volume: Epithelialization: Medium (34-66%) Tunneling: No Undermining: No Wound Description Classification: Grade 1 Wound Margin: Flat and Intact Exudate Amount: Medium Exudate Type: Serous Exudate Color: amber Foul Odor After Cleansing: No Slough/Fibrino No Wound Bed Granulation Amount: None Present (0%) Exposed Structure Necrotic Amount: Large (67-100%) Fascia Exposed: No Necrotic Quality: Eschar Fat Layer (Subcutaneous Tissue) Exposed: Yes Tendon Exposed: No Muscle Exposed: No Joint Exposed: No Bone Exposed: No Periwound Skin Texture NATALE, BALCOM. ([id number]) Texture Color No Abnormalities Noted: No No Abnormalities Noted: No Callus: No Atrophie Blanche: No Crepitus: No Cyanosis: No Excoriation: No Ecchymosis:  No Induration: No Erythema: Yes Rash: No Erythema Location: Circumferential Scarring: No Hemosiderin Staining: Yes Mottled: No Moisture Pallor: No No Abnormalities Noted: No Rubor: No Dry / Scaly: No Maceration: No Temperature / Pain Temperature: No Abnormality Tenderness on Palpation: Yes Wound Preparation Ulcer Cleansing: Rinsed/Irrigated with Saline Topical Anesthetic Applied: Other: lidocaine 4%, Treatment Notes Wound #1 (Left, Anterior Lower Leg) Notes silvercel, gauze, 3 layer wrap with unna to anchor Electronic Signature(s) Signed: 11/30/2018 5:36:16 PM By: Curtis Sites Entered By: Curtis Sites on 11/30/2018 09:24:05 Shawn Rivas ([id number]) -------------------------------------------------------------------------------- Vitals Details Patient Name: Shawn Rivas. Date of Service: 11/30/2018 8:45 AM Medical Record Number: [id number] Patient Account Number: [id number] Date of Birth/Sex: [birth year] (53 y.o. M) Treating RN: Huel Coventry Primary Care Nyonna Hargrove: Marisue Ivan Other Clinician: Referring Bedford Winsor: Marisue Ivan Treating Lesle Faron/Extender: Altamese [place] in Treatment: 0 Vital Signs Time Taken: 09:05 Temperature (F): 97.6 Weight (lbs): 286.6 Pulse (bpm): 91 Respiratory Rate (breaths/min): 16 Blood Pressure (mmHg): 177/94 Reference Range: 80 - 120 mg / dl Electronic Signature(s) Signed: 11/30/2018 2:49:55 PM By: Dayton Martes RCP, RRT, CHT Entered By: Dayton Martes on 11/30/2018 09:06:38
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
## 2018-12-07 ENCOUNTER — Encounter: Payer: Disability Insurance | Admitting: Physical Therapy

## 2018-12-07 ENCOUNTER — Encounter: Payer: Medicare Other | Admitting: Internal Medicine

## 2018-12-07 DIAGNOSIS — E11622 Type 2 diabetes mellitus with other skin ulcer: Secondary | ICD-10-CM | POA: Diagnosis not present

## 2018-12-08 ENCOUNTER — Ambulatory Visit: Payer: Medicare Other | Admitting: Physical Therapy

## 2018-12-08 ENCOUNTER — Ambulatory Visit: Payer: Medicare Other

## 2018-12-08 ENCOUNTER — Other Ambulatory Visit: Payer: Self-pay

## 2018-12-08 DIAGNOSIS — R296 Repeated falls: Secondary | ICD-10-CM

## 2018-12-08 DIAGNOSIS — R262 Difficulty in walking, not elsewhere classified: Secondary | ICD-10-CM

## 2018-12-08 DIAGNOSIS — R609 Edema, unspecified: Secondary | ICD-10-CM

## 2018-12-08 DIAGNOSIS — R209 Unspecified disturbances of skin sensation: Secondary | ICD-10-CM

## 2018-12-08 DIAGNOSIS — M6281 Muscle weakness (generalized): Secondary | ICD-10-CM

## 2018-12-08 DIAGNOSIS — R2689 Other abnormalities of gait and mobility: Secondary | ICD-10-CM
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
## 2018-12-09 NOTE — Progress Notes (Signed)
Shawn Rivas, Shawn Rivas ([id number]) Visit Report for 12/07/2018 Arrival Information Details Patient Name: Shawn Rivas, Shawn Rivas. Date of Service: 12/07/2018 8:15 AM Medical Record Number: [id number] Patient Account Number: [id number] Date of Birth/Sex: [birth year] (53 y.o. M) Treating RN: Arnette Norris Primary Care Faryn Sieg: Marisue Ivan Other Clinician: Referring Jakyri Brunkhorst: Marisue Ivan Treating Cartier Washko/Extender: Altamese [place] in Treatment: 1 Visit Information History Since Last Visit Added or deleted any medications: No Patient Arrived: Crutches Any new allergies or adverse reactions: No Arrival Time: 08:29 Had a fall or experienced change in No Accompanied By: self activities of daily living that may affect Transfer Assistance: None risk of falls: Patient Identification Verified: Yes Signs or symptoms of abuse/neglect since last visito No Secondary Verification Process Completed: Yes Hospitalized since last visit: No Pain Present Now: Yes Electronic Signature(s) Signed: 12/08/2018 7:54:32 AM By: Arnette Norris Entered By: Arnette Norris on 12/07/2018 08:29:40 Shawn Rivas ([id number]) -------------------------------------------------------------------------------- Clinic Level of Care Assessment Details Patient Name: Shawn Rivas Date of Service: 12/07/2018 8:15 AM Medical Record Number: [id number] Patient Account Number: [id number] Date of Birth/Sex: 1966/09/25 (53 y.o. M) Treating RN: Huel Coventry Primary Care Angel Weedon: Marisue Ivan Other Clinician: Referring Obediah Welles: Marisue Ivan Treating Lula Kolton/Extender: Altamese [place] in Treatment: 1 Clinic Level of Care Assessment Items TOOL 4 Quantity Score  - Use when only an EandM is performed on FOLLOW-UP visit 0 ASSESSMENTS - Nursing Assessment / Reassessment  - Reassessment of Co-morbidities (includes updates in patient status) 0 X- 1 5 Reassessment of Adherence to Treatment  Plan ASSESSMENTS - Wound and Skin Assessment / Reassessment X - Simple Wound Assessment / Reassessment - one wound 1 5  - 0 Complex Wound Assessment / Reassessment - multiple wounds  - 0 Dermatologic / Skin Assessment (not related to wound area) ASSESSMENTS - Focused Assessment  - Circumferential Edema Measurements - multi extremities 0  - 0 Nutritional Assessment / Counseling / Intervention  - 0 Lower Extremity Assessment (monofilament, tuning fork, pulses)  - 0 Peripheral Arterial Disease Assessment (using hand held doppler) ASSESSMENTS - Ostomy and/or Continence Assessment and Care  - Incontinence Assessment and Management 0  - 0 Ostomy Care Assessment and Management (repouching, etc.) PROCESS - Coordination of Care X - Simple Patient / Family Education for ongoing care 1 15  - 0 Complex (extensive) Patient / Family Education for ongoing care  - 0 Staff obtains Chiropractor, Records, Test Results / Process Orders  - 0 Staff telephones HHA, Nursing Homes / Clarify orders / etc  - 0 Routine Transfer to another Facility (non-emergent condition)  - 0 Routine Hospital Admission (non-emergent condition)  - 0 New Admissions / Manufacturing engineer / Ordering NPWT, Apligraf, etc.  - 0 Emergency Hospital Admission (emergent condition) X- 1 10 Simple Discharge Coordination Shawn Rivas, Shawn Rivas ([id number])  - 0 Complex (extensive) Discharge Coordination PROCESS - Special Needs  - Pediatric / Minor Patient Management 0  - 0 Isolation Patient Management  - 0 Hearing / Language / Visual special needs  - 0 Assessment of Community assistance (transportation, D/C planning, etc.)  - 0 Additional assistance / Altered mentation  - 0 Support Surface(s) Assessment (bed, cushion, seat, etc.) INTERVENTIONS - Wound Cleansing / Measurement  - Simple Wound Cleansing - one wound 0  - 0 Complex Wound Cleansing - multiple wounds X- 1 5 Wound  Imaging (photographs - any number of wounds)  - 0 Wound Tracing (instead of photographs)  - 0 Simple Wound Measurement - one wound  - 0 Complex Wound  Measurement - multiple wounds INTERVENTIONS - Wound Dressings []  - Small Wound Dressing one or multiple wounds 0 []  - 0 Medium Wound Dressing one or multiple wounds []  - 0 Large Wound Dressing one or multiple wounds []  - 0 Application of Medications - topical []  - 0 Application of Medications - injection INTERVENTIONS - Miscellaneous []  - External ear exam 0 []  - 0 Specimen Collection (cultures, biopsies, blood, body fluids, etc.) []  - 0 Specimen(s) / Culture(s) sent or taken to Lab for analysis []  - 0 Patient Transfer (multiple staff / Nurse, adult / Similar devices) []  - 0 Simple Staple / Suture removal (25 or less) []  - 0 Complex Staple / Suture removal (26 or more) []  - 0 Hypo / Hyperglycemic Management (close monitor of Blood Glucose) []  - 0 Ankle / Brachial Index (ABI) - do not check if billed separately X- 1 5 Vital Signs Shawn Rivas, Shawn Rivas ([id number]) Has the patient been seen at the hospital within the last three years: Yes Total Score: 45 Level Of Care: New/Established - Level 2 Electronic Signature(s) Signed: 12/07/2018 5:55:39 PM By: Elliot Gurney, BSN, RN, CWS, Kim RN, BSN Entered By: Elliot Gurney, BSN, RN, CWS, Kim on 12/07/2018 08:45:09 Shawn Rivas ([id number]) -------------------------------------------------------------------------------- Encounter Discharge Information Details Patient Name: Shawn Rivas. Date of Service: 12/07/2018 8:15 AM Medical Record Number: [id number] Patient Account Number: [id number] Date of Birth/Sex: Oct 05, 1966 (53 y.o. M) Treating RN: Huel Coventry Primary Care Shanterria Franta: Marisue Ivan Other Clinician: Referring Kyson Kupper: Marisue Ivan Treating Dashae Wilcher/Extender: Altamese [place] in Treatment: 1 Encounter Discharge Information Items Discharge Condition:  Stable Ambulatory Status: Ambulatory Discharge Destination: Home Transportation: Private Auto Accompanied By: self Schedule Follow-up Appointment: Yes Clinical Summary of Care: Electronic Signature(s) Signed: 12/07/2018 5:55:39 PM By: Elliot Gurney, BSN, RN, CWS, Kim RN, BSN Entered By: Elliot Gurney, BSN, RN, CWS, Kim on 12/07/2018 08:48:14 Shawn Rivas ([id number]) -------------------------------------------------------------------------------- Lower Extremity Assessment Details Patient Name: Shawn Rivas, Shawn Rivas. Date of Service: 12/07/2018 8:15 AM Medical Record Number: [id number] Patient Account Number: [id number] Date of Birth/Sex: Jul 13, 1966 (53 y.o. M) Treating RN: Arnette Norris Primary Care Arlyce Circle: Marisue Ivan Other Clinician: Referring Jerimie Mancuso: Marisue Ivan Treating Kelsie Zaborowski/Extender: Altamese [place] in Treatment: 1 Edema Assessment Assessed: [Left: No] [Right: No] [Left: Edema] [Right: :] Calf Left: Right: Point of Measurement: 34 cm From Medial Instep 41.6 cm cm Ankle Left: Right: Point of Measurement: 14 cm From Medial Instep 29.6 cm cm Vascular Assessment Pulses: Dorsalis Pedis Palpable: [Left:Yes] Doppler Audible: [Left:Yes] Posterior Tibial Extremity colors, hair growth, and conditions: Extremity Color: [Left:Hyperpigmented] Hair Growth on Extremity: [Left:Yes] Temperature of Extremity: [Left:Warm] Capillary Refill: [Left:< 3 seconds] Toe Nail Assessment Left: Right: Thick: No Discolored: No Deformed: No Improper Length and Hygiene: No Electronic Signature(s) Signed: 12/08/2018 7:54:32 AM By: Arnette Norris Entered By: Arnette Norris on 12/07/2018 08:36:09 Shawn Rivas ([id number]) -------------------------------------------------------------------------------- Multi Wound Chart Details Patient Name: Shawn Rivas Date of Service: 12/07/2018 8:15 AM Medical Record Number: [id number] Patient Account Number: [id number] Date of  Birth/Sex: 1966-05-21 (53 y.o. M) Treating RN: Huel Coventry Primary Care Kendall Justo: Marisue Ivan Other Clinician: Referring Donya Tomaro: Marisue Ivan Treating Lether Tesch/Extender: Altamese [place] in Treatment: 1 Vital Signs Height(in): Pulse(bpm): 90 Weight(lbs): 266 Blood Pressure(mmHg): 164/77 Body Mass Index(BMI): Temperature(F): 98.3 Respiratory Rate 18 (breaths/min): Photos: [1:No Photos] [N/A:N/A] Wound Location: [1:Left Lower Leg - Anterior] [N/A:N/A] Wounding Event: [1:Blister] [N/A:N/A] Primary Etiology: [1:Diabetic Wound/Ulcer of the Lower Extremity] [N/A:N/A] Secondary Etiology: [1:Venous Leg Ulcer] [N/A:N/A] Comorbid History: [1:Hypertension, Type II Diabetes, Osteoarthritis, Neuropathy] [  N/A:N/A] Date Acquired: [1:10/25/2018] [N/A:N/A] Weeks of Treatment: [1:1] [N/A:N/A] Wound Status: [1:Open] [N/A:N/A] Measurements L x W x D [1:0.1x0.1x0.1] [N/A:N/A] (cm) Area (cm) : [1:0.008] [N/A:N/A] Volume (cm) : [1:0.001] [N/A:N/A] % Reduction in Area: [1:99.40%] [N/A:N/A] % Reduction in Volume: [1:99.30%] [N/A:N/A] Classification: [1:Grade 1] [N/A:N/A] Exudate Amount: [1:Medium] [N/A:N/A] Exudate Type: [1:Serous] [N/A:N/A] Exudate Color: [1:amber] [N/A:N/A] Wound Margin: [1:Flat and Intact] [N/A:N/A] Granulation Amount: [1:None Present (0%)] [N/A:N/A] Necrotic Amount: [1:Large (67-100%)] [N/A:N/A] Necrotic Tissue: [1:Eschar] [N/A:N/A] Exposed Structures: [1:Fascia: No Fat Layer (Subcutaneous Tissue) Exposed: No Tendon: No Muscle: No Joint: No Bone: No] [N/A:N/A] Epithelialization: [1:Medium (34-66%)] [N/A:N/A] Periwound Skin Texture: [1:Excoriation: No Induration: No Callus: No] [N/A:N/A] Crepitus: No Rash: No Scarring: No Periwound Skin Moisture: Maceration: No N/A N/A Dry/Scaly: No Periwound Skin Color: Erythema: Yes N/A N/A Hemosiderin Staining: Yes Atrophie Blanche: No Cyanosis: No Ecchymosis: No Mottled: No Pallor: No Rubor:  No Erythema Location: Circumferential N/A N/A Temperature: No Abnormality N/A N/A Tenderness on Palpation: Yes N/A N/A Wound Preparation: Ulcer Cleansing: N/A N/A Rinsed/Irrigated with Saline Topical Anesthetic Applied: Other: lidocaine 4% Treatment Notes Wound #1 (Left, Anterior Lower Leg) Notes Farrow 4000 wrap applied Electronic Signature(s) Signed: 12/07/2018 4:42:30 PM By: Baltazar Najjar MD Entered By: Baltazar Najjar on 12/07/2018 09:00:13 Shawn Rivas ([id number]) -------------------------------------------------------------------------------- Multi-Disciplinary Care Plan Details Patient Name: Shawn Rivas, Shawn Rivas. Date of Service: 12/07/2018 8:15 AM Medical Record Number: [id number] Patient Account Number: [id number] Date of Birth/Sex: [birth year] (53 y.o. M) Treating RN: Huel Coventry Primary Care Brendy Ficek: Marisue Ivan Other Clinician: Referring Nikiya Starn: Marisue Ivan Treating Ramiya Delahunty/Extender: Altamese [place] in Treatment: 1 Active Inactive Electronic Signature(s) Signed: 12/07/2018 5:55:39 PM By: Elliot Gurney, BSN, RN, CWS, Kim RN, BSN Entered By: Elliot Gurney, BSN, RN, CWS, Kim on 12/07/2018 08:44:43 Shawn Rivas ([id number]) -------------------------------------------------------------------------------- Pain Assessment Details Patient Name: Shawn Rivas Date of Service: 12/07/2018 8:15 AM Medical Record Number: [id number] Patient Account Number: [id number] Date of Birth/Sex: 1966-04-10 (53 y.o. M) Treating RN: Arnette Norris Primary Care Tynleigh Birt: Marisue Ivan Other Clinician: Referring Camala Talwar: Marisue Ivan Treating Elide Stalzer/Extender: Altamese [place] in Treatment: 1 Active Problems Location of Pain Severity and Description of Pain Patient Has Paino Yes Site Locations Pain Management and Medication Current Pain Management: Notes Pain around ankle Electronic Signature(s) Signed: 12/08/2018 7:54:32 AM By: Arnette Norris Entered By: Arnette Norris on 12/07/2018 08:30:00 Shawn Rivas ([id number]) -------------------------------------------------------------------------------- Patient/Caregiver Education Details Patient Name: Shawn Rivas Date of Service: 12/07/2018 8:15 AM Medical Record Number: [id number] Patient Account Number: [id number] Date of Birth/Gender: 01-16-1966 (53 y.o. M) Treating RN: Huel Coventry Primary Care Physician: Marisue Ivan Other Clinician: Referring Physician: Marisue Ivan Treating Physician/Extender: Altamese [place] in Treatment: 1 Education Assessment Education Provided To: Patient Education Topics Provided Venous: Handouts: Controlling Swelling with Compression Stockings , Other: Farrow 4000 applied Methods: Demonstration, Explain/Verbal Responses: State content correctly Electronic Signature(s) Signed: 12/07/2018 5:55:39 PM By: Elliot Gurney, BSN, RN, CWS, Kim RN, BSN Entered By: Elliot Gurney, BSN, RN, CWS, Kim on 12/07/2018 08:45:44 Shawn Rivas ([id number]) -------------------------------------------------------------------------------- Wound Assessment Details Patient Name: Shawn Rivas Date of Service: 12/07/2018 8:15 AM Medical Record Number: [id number] Patient Account Number: [id number] Date of Birth/Sex: 08-Apr-1966 (53 y.o. M) Treating RN: Huel Coventry Primary Care Joee Iovine: Marisue Ivan Other Clinician: Referring Tully Burgo: Marisue Ivan Treating Nadra Hritz/Extender: Altamese  in Treatment: 1 Wound Status Wound Number: 1 Primary Etiology: Diabetic Wound/Ulcer of the Lower Extremity Wound Location: Left, Anterior Lower Leg Secondary Venous Leg Ulcer Wounding Event: Blister Etiology: Date Acquired: 10/25/2018  Wound Status: Healed - Epithelialized Weeks Of Treatment: 1 Comorbid Hypertension, Type II Diabetes, Clustered Wound: No History: Osteoarthritis, Neuropathy Wound Measurements Length: (cm) 0 Width:  (cm) 0 Depth: (cm) 0 Area: (cm) 0.008 Volume: (cm) 0.001 % Reduction in Area: 99.4% % Reduction in Volume: 99.3% Epithelialization: Medium (34-66%) Tunneling: No Undermining: No Wound Description Classification: Grade 1 Foul O Wound Margin: Flat and Intact Slough Exudate Amount: Medium Exudate Type: Serous Exudate Color: amber dor After Cleansing: No /Fibrino No Wound Bed Granulation Amount: None Present (0%) Exposed Structure Necrotic Amount: Large (67-100%) Fascia Exposed: No Necrotic Quality: Eschar Fat Layer (Subcutaneous Tissue) Exposed: No Tendon Exposed: No Muscle Exposed: No Joint Exposed: No Bone Exposed: No Periwound Skin Texture Texture Color No Abnormalities Noted: No No Abnormalities Noted: No Callus: No Atrophie Blanche: No Crepitus: No Cyanosis: No Excoriation: No Ecchymosis: No Induration: No Erythema: Yes Rash: No Erythema Location: Circumferential Scarring: No Hemosiderin Staining: Yes Mottled: No Moisture Pallor: No No Abnormalities Noted: No Rubor: No Dry / Scaly: No Maceration: No Temperature / Pain Shawn Rivas, Shawn S. ([id number]) Temperature: No Abnormality Tenderness on Palpation: Yes Wound Preparation Ulcer Cleansing: Rinsed/Irrigated with Saline Topical Anesthetic Applied: Other: lidocaine 4%, Electronic Signature(s) Signed: 12/07/2018 5:55:39 PM By: Elliot GurneyWoody, BSN, RN, CWS, Kim RN, BSN Entered By: Elliot GurneyWoody, BSN, RN, CWS, Kim on 12/07/2018 12:03:27 Shawn Rivas, Shawn S. ([id number]) -------------------------------------------------------------------------------- Vitals Details Patient Name: Shawn Rivas, Shawn S. Date of Service: 12/07/2018 8:15 AM Medical Record Number: [id number] Patient Account Number: [id number] Date of Birth/Sex: 20-May-1966 (53 y.o. M) Treating RN: Arnette NorrisBiell, Kristina Primary Care Tasean Mancha: Marisue IvanLinthavong, Kanhka Other Clinician: Referring Kiyra Slaubaugh: Marisue IvanLinthavong, Kanhka Treating Dallana Mavity/Extender: Altamese CarolinaOBSON, MICHAEL G Weeks in  Treatment: 1 Vital Signs Time Taken: 08:30 Temperature (F): 98.3 Source: Stated Pulse (bpm): 90 Weight (lbs): 266 Respiratory Rate (breaths/min): 18 Source: Stated Blood Pressure (mmHg): 164/77 Reference Range: 80 - 120 mg / dl Electronic Signature(s) Signed: 12/08/2018 7:54:32 AM By: Arnette NorrisBiell, Kristina Entered By: Arnette NorrisBiell, Kristina on 12/07/2018 08:32:09

## 2018-12-13 ENCOUNTER — Ambulatory Visit: Payer: Self-pay

## 2018-12-20 ENCOUNTER — Ambulatory Visit: Payer: Medicare Other | Attending: Family Medicine | Admitting: Physical Therapy

## 2018-12-20 DIAGNOSIS — R296 Repeated falls: Secondary | ICD-10-CM

## 2018-12-20 DIAGNOSIS — M6281 Muscle weakness (generalized): Secondary | ICD-10-CM | POA: Insufficient documentation

## 2018-12-20 DIAGNOSIS — R609 Edema, unspecified: Secondary | ICD-10-CM

## 2018-12-20 DIAGNOSIS — R262 Difficulty in walking, not elsewhere classified: Secondary | ICD-10-CM | POA: Diagnosis present

## 2018-12-20 DIAGNOSIS — R2689 Other abnormalities of gait and mobility: Secondary | ICD-10-CM | POA: Diagnosis present

## 2018-12-20 DIAGNOSIS — R209 Unspecified disturbances of skin sensation: Secondary | ICD-10-CM | POA: Diagnosis present
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
## 2018-12-21 ENCOUNTER — Encounter: Payer: Self-pay | Admitting: Physical Therapy

## 2018-12-26 ENCOUNTER — Ambulatory Visit: Payer: Medicare Other | Admitting: Physical Therapy

## 2018-12-26 ENCOUNTER — Other Ambulatory Visit: Payer: Self-pay

## 2018-12-26 ENCOUNTER — Encounter: Payer: Self-pay | Admitting: Physical Therapy

## 2018-12-26 DIAGNOSIS — R609 Edema, unspecified: Secondary | ICD-10-CM

## 2018-12-26 DIAGNOSIS — R296 Repeated falls: Secondary | ICD-10-CM

## 2018-12-26 DIAGNOSIS — R209 Unspecified disturbances of skin sensation: Secondary | ICD-10-CM

## 2018-12-26 DIAGNOSIS — R2689 Other abnormalities of gait and mobility: Secondary | ICD-10-CM

## 2018-12-26 DIAGNOSIS — R262 Difficulty in walking, not elsewhere classified: Secondary | ICD-10-CM

## 2018-12-26 DIAGNOSIS — M6281 Muscle weakness (generalized): Secondary | ICD-10-CM
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
## 2018-12-27 ENCOUNTER — Ambulatory Visit: Payer: Medicare Other | Admitting: Physical Therapy

## 2018-12-27 ENCOUNTER — Encounter: Payer: Self-pay | Admitting: Physical Therapy

## 2018-12-27 DIAGNOSIS — R209 Unspecified disturbances of skin sensation: Secondary | ICD-10-CM

## 2018-12-27 DIAGNOSIS — R262 Difficulty in walking, not elsewhere classified: Secondary | ICD-10-CM

## 2018-12-27 DIAGNOSIS — R296 Repeated falls: Secondary | ICD-10-CM | POA: Diagnosis not present

## 2018-12-27 DIAGNOSIS — M6281 Muscle weakness (generalized): Secondary | ICD-10-CM

## 2018-12-27 DIAGNOSIS — R609 Edema, unspecified: Secondary | ICD-10-CM

## 2018-12-27 DIAGNOSIS — R2689 Other abnormalities of gait and mobility: Secondary | ICD-10-CM
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
## 2018-12-28 ENCOUNTER — Telehealth: Payer: Self-pay | Admitting: Physical Therapy

## 2018-12-28 NOTE — Telephone Encounter (Signed)
Returned call as requested by pt to discuss his recent nerve conduction study (see chart for results faxed by [facility]). Pt reports he saw Dr. Shary Decamp for this visit who said "it doesn't look good" and "aggreed with everything PT said" and wanted to know what PT would recommend and was open to PT's ideas. Did not discus the report in detail with the patient but he explained that Dr. Carmelina Peal has been managing things for him for years and is great to work with. Pt was unsure if doctor was getting an orthotic referral to get an AFO for the foot drop.   [facility] to improve coordination of care and communicate directly with pt's care team about his foot drop, and for clarification purposes to let them know that I recommended (and was hoping they would make if they agreed) a referral to an orthotist to get an AFO to address the foot drop and reduce fall risk. Spoke to coordinator who took down a message to give to care team and who said they would call back if they had any questions.

## 2018-12-29 ENCOUNTER — Ambulatory Visit: Payer: Medicare Other | Admitting: Physical Therapy

## 2019-01-02 ENCOUNTER — Encounter: Payer: Self-pay | Admitting: Physical Therapy

## 2019-01-02 DIAGNOSIS — R2689 Other abnormalities of gait and mobility: Secondary | ICD-10-CM

## 2019-01-02 DIAGNOSIS — M6281 Muscle weakness (generalized): Secondary | ICD-10-CM

## 2019-01-02 DIAGNOSIS — R262 Difficulty in walking, not elsewhere classified: Secondary | ICD-10-CM

## 2019-01-02 DIAGNOSIS — R609 Edema, unspecified: Secondary | ICD-10-CM

## 2019-01-02 DIAGNOSIS — R209 Unspecified disturbances of skin sensation: Secondary | ICD-10-CM

## 2019-01-02 DIAGNOSIS — R296 Repeated falls: Secondary | ICD-10-CM

## 2019-01-02 NOTE — Therapy (Signed)
[facility] [address] [place], [address] Phone: [phone]   Fax:  [phone]  Patient Details  Name: Shawn Rivas MRN: [id number] Date of Birth: [birth year] Referring Provider:  No ref. provider found  Encounter Date: 01/02/2019  Reason for telecommunication instead of in person visit: Clinic is closed due to precautions to prevent COVID-19 spread during the pandemic.  Called pt to update about clinic closing for in-person visits due to COVID-19 pandemic. Spoke to pt letting pt know their appointments are canceled at this time, we are monitoring the phones/voicemail, are looking into telehealth options, and would like to know if they is interested in that. Also let pt know that I am available by phone to discuss any concerns, answer questions, provide advice, modify exercise program, and problem solve remotely; that I want to and be there with them as much as possible without in-person visits. Advised pt to contact me if they have any PT needs or questions. Let pt know we will call back when the clinic re-opens.  Pt reports he is doing well. Went to [employer] and did a lot of walking. Has no needs at this point but is eager to return when able. May be interested in telehealth. Wants to communicate over email or mychart so provided info on how to sign up for mychart and provided my work email so he can send questions and concerns that way.    Cira Rue, PT, DPT 01/02/2019, 1:05 PM  [facility] [address], [place], [address] Phone: [phone]   Fax:  [phone]

## 2019-01-03 ENCOUNTER — Ambulatory Visit: Payer: Medicare Other | Admitting: Physical Therapy

## 2019-01-05 ENCOUNTER — Ambulatory Visit: Payer: Medicare Other | Admitting: Physical Therapy

## 2019-01-10 ENCOUNTER — Encounter: Payer: Disability Insurance | Admitting: Physical Therapy

## 2019-01-12 ENCOUNTER — Ambulatory Visit: Payer: Self-pay

## 2019-01-12 ENCOUNTER — Encounter: Payer: Self-pay | Admitting: Physical Therapy

## 2019-01-17 ENCOUNTER — Ambulatory Visit: Payer: Self-pay

## 2019-01-19 ENCOUNTER — Ambulatory Visit: Payer: Self-pay

## 2019-01-19 ENCOUNTER — Encounter: Payer: Self-pay | Admitting: Physical Therapy

## 2019-01-19 NOTE — Therapy (Signed)
[facility] MAIN [facility] SERVICES [address] [place], [place], [address] Phone: [phone]   Fax:  [phone]  Patient Details  Name: Shawn Rivas MRN: [id number] Date of Birth: 02/23/1966 Referring Provider:  No ref. provider found  Encounter Date: 01/19/2019  The [facility] outpatient clinics are closed at this time due to the COVID-19 epidemic. The patient was contacted in regards to their therapy services. The patient is in agreement that they are safe and consent to being on hold for therapy services until the [facility] outpatient facilities reopen. At which time, the patient will be contacted to schedule an appointment to resume therapy services.   Provided contact information and we discussed importance of pt contacting his neurologist to get referral to orthotist (he prefers [facility]) to get an AFO. Pt states he has been getting around okay and is very busy and active at work. He was not interested in home health PT or telehealth PT at this time. He is increasingly aware of his foot drop and would really like to get an AFO to help prevent falls. I encouraged him to do so and feel that this is the most important next step in his care to help prevent falls at this point.   Luretha Murphy. [name], PT, DPT 01/19/19, 4:08 PM  [facility] MAIN [facility] SERVICES [address] [place], [place], [address] Phone: [phone]   Fax:  [phone]

## 2019-01-24 ENCOUNTER — Ambulatory Visit: Payer: Self-pay

## 2019-01-26 ENCOUNTER — Ambulatory Visit: Payer: Self-pay

## 2019-01-31 ENCOUNTER — Ambulatory Visit: Payer: Self-pay

## 2019-02-02 ENCOUNTER — Ambulatory Visit: Payer: Self-pay

## 2019-02-02 ENCOUNTER — Telehealth: Payer: Self-pay | Admitting: Physical Therapy

## 2019-02-02 NOTE — Telephone Encounter (Signed)
Called [facility] in [place] at [phone] and found out they are still open on Thursdays and should be able to see patient when they get the referral. [facility] said they need a prescription for the ankle orthosis and a demographics sheet directly from the doctor.    Called Dr. Charlesetta Ivory at [phone] office today and spoke to [facility] at nurse triage. She put in a request to Dr. Carmelina Peal to send a prescription for an ankle orthosis and demographics sheet to [facility].   Emailed patient to update him on my communication with the above offices. Requested him to let me know if he gets set up with [facility] and that I expect to follow up if I haven't heard back in a week.   Luretha Murphy. [name], PT, DPT 02/02/19, 5:02 PM

## 2019-02-07 ENCOUNTER — Ambulatory Visit: Payer: Self-pay

## 2019-02-09 ENCOUNTER — Ambulatory Visit: Payer: Medicare Other | Admitting: Physical Therapy

## 2019-02-09 ENCOUNTER — Ambulatory Visit: Payer: Self-pay

## 2019-02-28 ENCOUNTER — Ambulatory Visit: Payer: Medicare Other | Attending: Family Medicine | Admitting: Physical Therapy

## 2019-02-28 ENCOUNTER — Other Ambulatory Visit: Payer: Self-pay

## 2019-02-28 ENCOUNTER — Encounter: Payer: Self-pay | Admitting: Physical Therapy

## 2019-02-28 DIAGNOSIS — R2689 Other abnormalities of gait and mobility: Secondary | ICD-10-CM

## 2019-02-28 DIAGNOSIS — R262 Difficulty in walking, not elsewhere classified: Secondary | ICD-10-CM

## 2019-02-28 DIAGNOSIS — M6281 Muscle weakness (generalized): Secondary | ICD-10-CM | POA: Diagnosis present

## 2019-02-28 DIAGNOSIS — R296 Repeated falls: Secondary | ICD-10-CM | POA: Diagnosis not present

## 2019-02-28 DIAGNOSIS — R609 Edema, unspecified: Secondary | ICD-10-CM | POA: Diagnosis present

## 2019-02-28 DIAGNOSIS — R209 Unspecified disturbances of skin sensation: Secondary | ICD-10-CM | POA: Diagnosis present
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
## 2019-03-01 ENCOUNTER — Encounter: Payer: Self-pay | Admitting: Physical Therapy

## 2019-03-02 ENCOUNTER — Ambulatory Visit: Payer: Medicare Other | Admitting: Physical Therapy

## 2019-03-02 ENCOUNTER — Other Ambulatory Visit: Payer: Self-pay

## 2019-03-02 ENCOUNTER — Encounter: Payer: Self-pay | Admitting: Physical Therapy

## 2019-03-02 VITALS — BP 165/82 | HR 93

## 2019-03-02 DIAGNOSIS — R609 Edema, unspecified: Secondary | ICD-10-CM

## 2019-03-02 DIAGNOSIS — R296 Repeated falls: Secondary | ICD-10-CM

## 2019-03-02 DIAGNOSIS — M6281 Muscle weakness (generalized): Secondary | ICD-10-CM

## 2019-03-02 DIAGNOSIS — R262 Difficulty in walking, not elsewhere classified: Secondary | ICD-10-CM

## 2019-03-02 DIAGNOSIS — R209 Unspecified disturbances of skin sensation: Secondary | ICD-10-CM

## 2019-03-02 DIAGNOSIS — R2689 Other abnormalities of gait and mobility: Secondary | ICD-10-CM
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
## 2019-03-07 ENCOUNTER — Encounter: Payer: Self-pay | Admitting: Physical Therapy

## 2019-03-07 ENCOUNTER — Other Ambulatory Visit: Payer: Self-pay

## 2019-03-07 ENCOUNTER — Ambulatory Visit: Payer: Medicare Other | Admitting: Physical Therapy

## 2019-03-07 VITALS — BP 152/94 | HR 88 | Temp 98.7°F

## 2019-03-07 DIAGNOSIS — R296 Repeated falls: Secondary | ICD-10-CM | POA: Diagnosis not present

## 2019-03-07 DIAGNOSIS — M6281 Muscle weakness (generalized): Secondary | ICD-10-CM

## 2019-03-07 DIAGNOSIS — R2689 Other abnormalities of gait and mobility: Secondary | ICD-10-CM

## 2019-03-07 DIAGNOSIS — R209 Unspecified disturbances of skin sensation: Secondary | ICD-10-CM

## 2019-03-07 DIAGNOSIS — R609 Edema, unspecified: Secondary | ICD-10-CM

## 2019-03-07 DIAGNOSIS — R262 Difficulty in walking, not elsewhere classified: Secondary | ICD-10-CM
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
## 2019-03-09 ENCOUNTER — Other Ambulatory Visit: Payer: Self-pay

## 2019-03-09 ENCOUNTER — Ambulatory Visit: Payer: Medicare Other | Admitting: Physical Therapy

## 2019-03-09 VITALS — BP 162/92 | HR 108 | Resp 100

## 2019-03-09 DIAGNOSIS — R262 Difficulty in walking, not elsewhere classified: Secondary | ICD-10-CM

## 2019-03-09 DIAGNOSIS — R296 Repeated falls: Secondary | ICD-10-CM

## 2019-03-09 DIAGNOSIS — R209 Unspecified disturbances of skin sensation: Secondary | ICD-10-CM

## 2019-03-09 DIAGNOSIS — M6281 Muscle weakness (generalized): Secondary | ICD-10-CM

## 2019-03-09 DIAGNOSIS — R609 Edema, unspecified: Secondary | ICD-10-CM

## 2019-03-09 DIAGNOSIS — R2689 Other abnormalities of gait and mobility: Secondary | ICD-10-CM
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
## 2019-03-14 ENCOUNTER — Other Ambulatory Visit: Payer: Self-pay

## 2019-03-14 ENCOUNTER — Encounter: Payer: Self-pay | Admitting: Physical Therapy

## 2019-03-14 ENCOUNTER — Ambulatory Visit: Payer: Medicare Other | Attending: Family Medicine | Admitting: Physical Therapy

## 2019-03-14 VITALS — BP 176/85 | HR 88

## 2019-03-14 DIAGNOSIS — M6281 Muscle weakness (generalized): Secondary | ICD-10-CM | POA: Diagnosis present

## 2019-03-14 DIAGNOSIS — R2689 Other abnormalities of gait and mobility: Secondary | ICD-10-CM | POA: Diagnosis present

## 2019-03-14 DIAGNOSIS — R296 Repeated falls: Secondary | ICD-10-CM | POA: Insufficient documentation

## 2019-03-14 DIAGNOSIS — R209 Unspecified disturbances of skin sensation: Secondary | ICD-10-CM

## 2019-03-14 DIAGNOSIS — R609 Edema, unspecified: Secondary | ICD-10-CM | POA: Diagnosis present

## 2019-03-14 DIAGNOSIS — R262 Difficulty in walking, not elsewhere classified: Secondary | ICD-10-CM | POA: Diagnosis present
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
## 2019-03-16 ENCOUNTER — Ambulatory Visit: Payer: Medicare Other | Admitting: Physical Therapy

## 2019-03-16 ENCOUNTER — Encounter: Payer: Self-pay | Admitting: Physical Therapy

## 2019-03-16 ENCOUNTER — Other Ambulatory Visit: Payer: Self-pay

## 2019-03-16 VITALS — BP 168/86 | HR 94

## 2019-03-16 DIAGNOSIS — R2689 Other abnormalities of gait and mobility: Secondary | ICD-10-CM

## 2019-03-16 DIAGNOSIS — M6281 Muscle weakness (generalized): Secondary | ICD-10-CM

## 2019-03-16 DIAGNOSIS — R296 Repeated falls: Secondary | ICD-10-CM

## 2019-03-16 DIAGNOSIS — R262 Difficulty in walking, not elsewhere classified: Secondary | ICD-10-CM

## 2019-03-16 DIAGNOSIS — R609 Edema, unspecified: Secondary | ICD-10-CM

## 2019-03-16 DIAGNOSIS — R209 Unspecified disturbances of skin sensation: Secondary | ICD-10-CM
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
## 2019-03-21 ENCOUNTER — Other Ambulatory Visit: Payer: Self-pay

## 2019-03-21 ENCOUNTER — Ambulatory Visit: Payer: Medicare Other | Admitting: Physical Therapy

## 2019-03-21 ENCOUNTER — Encounter: Payer: Self-pay | Admitting: Physical Therapy

## 2019-03-21 VITALS — BP 148/85 | HR 90 | Temp 98.3°F

## 2019-03-21 DIAGNOSIS — R262 Difficulty in walking, not elsewhere classified: Secondary | ICD-10-CM

## 2019-03-21 DIAGNOSIS — R609 Edema, unspecified: Secondary | ICD-10-CM

## 2019-03-21 DIAGNOSIS — R296 Repeated falls: Secondary | ICD-10-CM

## 2019-03-21 DIAGNOSIS — R209 Unspecified disturbances of skin sensation: Secondary | ICD-10-CM

## 2019-03-21 DIAGNOSIS — R2689 Other abnormalities of gait and mobility: Secondary | ICD-10-CM

## 2019-03-21 DIAGNOSIS — M6281 Muscle weakness (generalized): Secondary | ICD-10-CM
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
## 2019-03-23 ENCOUNTER — Encounter: Payer: Self-pay | Admitting: Physical Therapy

## 2019-03-23 ENCOUNTER — Other Ambulatory Visit: Payer: Self-pay

## 2019-03-23 ENCOUNTER — Ambulatory Visit: Payer: Medicare Other | Admitting: Physical Therapy

## 2019-03-23 VITALS — BP 156/84 | HR 95

## 2019-03-23 DIAGNOSIS — R262 Difficulty in walking, not elsewhere classified: Secondary | ICD-10-CM

## 2019-03-23 DIAGNOSIS — R296 Repeated falls: Secondary | ICD-10-CM | POA: Diagnosis not present

## 2019-03-23 DIAGNOSIS — R609 Edema, unspecified: Secondary | ICD-10-CM

## 2019-03-23 DIAGNOSIS — R2689 Other abnormalities of gait and mobility: Secondary | ICD-10-CM

## 2019-03-23 DIAGNOSIS — R209 Unspecified disturbances of skin sensation: Secondary | ICD-10-CM

## 2019-03-23 DIAGNOSIS — M6281 Muscle weakness (generalized): Secondary | ICD-10-CM
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
## 2019-03-28 ENCOUNTER — Encounter: Payer: Self-pay | Admitting: Physical Therapy

## 2019-03-28 ENCOUNTER — Ambulatory Visit: Payer: Medicare Other | Admitting: Physical Therapy

## 2019-03-28 ENCOUNTER — Other Ambulatory Visit: Payer: Self-pay

## 2019-03-28 VITALS — BP 148/78 | HR 90

## 2019-03-28 DIAGNOSIS — R296 Repeated falls: Secondary | ICD-10-CM | POA: Diagnosis not present

## 2019-03-28 DIAGNOSIS — R2689 Other abnormalities of gait and mobility: Secondary | ICD-10-CM

## 2019-03-28 DIAGNOSIS — R262 Difficulty in walking, not elsewhere classified: Secondary | ICD-10-CM

## 2019-03-28 DIAGNOSIS — R609 Edema, unspecified: Secondary | ICD-10-CM

## 2019-03-28 DIAGNOSIS — R209 Unspecified disturbances of skin sensation: Secondary | ICD-10-CM

## 2019-03-28 DIAGNOSIS — M6281 Muscle weakness (generalized): Secondary | ICD-10-CM
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
## 2019-03-30 ENCOUNTER — Other Ambulatory Visit: Payer: Self-pay

## 2019-03-30 ENCOUNTER — Ambulatory Visit: Payer: Medicare Other | Admitting: Physical Therapy

## 2019-03-30 ENCOUNTER — Encounter: Payer: Self-pay | Admitting: Physical Therapy

## 2019-03-30 VITALS — BP 134/82 | HR 84

## 2019-03-30 DIAGNOSIS — R262 Difficulty in walking, not elsewhere classified: Secondary | ICD-10-CM

## 2019-03-30 DIAGNOSIS — R209 Unspecified disturbances of skin sensation: Secondary | ICD-10-CM

## 2019-03-30 DIAGNOSIS — R609 Edema, unspecified: Secondary | ICD-10-CM

## 2019-03-30 DIAGNOSIS — R296 Repeated falls: Secondary | ICD-10-CM | POA: Diagnosis not present

## 2019-03-30 DIAGNOSIS — M6281 Muscle weakness (generalized): Secondary | ICD-10-CM

## 2019-03-30 DIAGNOSIS — R2689 Other abnormalities of gait and mobility: Secondary | ICD-10-CM
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
## 2019-04-04 ENCOUNTER — Other Ambulatory Visit: Payer: Self-pay

## 2019-04-04 ENCOUNTER — Ambulatory Visit: Payer: Medicare Other | Admitting: Physical Therapy

## 2019-04-04 ENCOUNTER — Encounter: Payer: Self-pay | Admitting: Physical Therapy

## 2019-04-04 ENCOUNTER — Telehealth: Payer: Self-pay | Admitting: Physical Therapy

## 2019-04-04 VITALS — BP 160/88

## 2019-04-04 DIAGNOSIS — R296 Repeated falls: Secondary | ICD-10-CM | POA: Diagnosis not present

## 2019-04-04 DIAGNOSIS — M6281 Muscle weakness (generalized): Secondary | ICD-10-CM

## 2019-04-04 DIAGNOSIS — R609 Edema, unspecified: Secondary | ICD-10-CM

## 2019-04-04 DIAGNOSIS — R209 Unspecified disturbances of skin sensation: Secondary | ICD-10-CM

## 2019-04-04 DIAGNOSIS — R262 Difficulty in walking, not elsewhere classified: Secondary | ICD-10-CM

## 2019-04-04 DIAGNOSIS — R2689 Other abnormalities of gait and mobility: Secondary | ICD-10-CM
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
## 2019-04-04 NOTE — Telephone Encounter (Signed)
Called Dr. Netty Starring at [phone] to report vision changes in patient and changes in blood glucose level. Spoke to [place] at his office [facility] medication. Lattie Haw took my message to pass along to Dr. Netty Starring. Explained that patient had recently improved his diet after repeated education on importance of blood glucose control and that his number had improved from in the low 300s to in the 100s and that although patient was feeling better overall, that he reported sudden loss of near vision Saturday morning and that it has not returned since then. Reported no other unusual symptoms or observations but wanted to report vision change since it was not explained by any factors I know of and may need medical attention outside my scope of practice. Lattie Haw too the message to relay to the doctor. I thanked her and let her know I was also sending a progress note that was due at this time anyway.   Everlean Alstrom. Graylon Good, PT, DPT 04/04/19, 1:36 PM

## 2019-04-06 ENCOUNTER — Telehealth: Payer: Self-pay | Admitting: Physical Therapy

## 2019-04-06 ENCOUNTER — Other Ambulatory Visit: Payer: Self-pay

## 2019-04-06 ENCOUNTER — Ambulatory Visit: Payer: Medicare Other | Admitting: Physical Therapy

## 2019-04-06 ENCOUNTER — Encounter: Payer: Self-pay | Admitting: Physical Therapy

## 2019-04-06 VITALS — BP 138/76 | HR 95

## 2019-04-06 DIAGNOSIS — R609 Edema, unspecified: Secondary | ICD-10-CM

## 2019-04-06 DIAGNOSIS — R2689 Other abnormalities of gait and mobility: Secondary | ICD-10-CM

## 2019-04-06 DIAGNOSIS — R296 Repeated falls: Secondary | ICD-10-CM | POA: Diagnosis not present

## 2019-04-06 DIAGNOSIS — M6281 Muscle weakness (generalized): Secondary | ICD-10-CM

## 2019-04-06 DIAGNOSIS — R209 Unspecified disturbances of skin sensation: Secondary | ICD-10-CM

## 2019-04-06 DIAGNOSIS — R262 Difficulty in walking, not elsewhere classified: Secondary | ICD-10-CM

## 2019-04-06 NOTE — Telephone Encounter (Signed)
Called patient to check on him after he emailed a picture of his glucose meter reading 56 mg/dL. He reports he started feeling abnormally tired at 3pm after eating a small salad for lunch and taking is insulin medications so he checked his blood glucose. Since it was low and he was feeling tired he ate some peanut butter and nuts he had at work and rechecked it. His blood glucose rose and he started feeling better. He states he has an appointment with his PCP on Tuesday and that his blood glucose was in the 40s this morning when the lab took it fasting. He thinks changing his diet has resulted in his insulin and blood regulation medications too strong and wants to see his PCP about it. I advised him that hypoglycemia is defined as below 70 mg/dL and to be cautious about it dropping while he is sleeping overnight. He informed me he would have his daughter check on him in the morning. He also informed me of some plans he has regarding his medication until he can see his physician. His ideas seemed reasonable and his actions in response to his previous low readings seem appropriate. He reported he is currently feeling fine and seems to have a reasonable plan to manage his glucose until seeing his physician. I advised him to contact his PCP if anything came up, that they were the appropriate medical professional to manage this. Or if it was urgent to contact the emergency room. Patient agreed.   Everlean Alstrom. Graylon Good, PT, DPT 04/06/19, 6:43 PM
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
## 2019-04-11 ENCOUNTER — Other Ambulatory Visit: Payer: Self-pay

## 2019-04-11 ENCOUNTER — Encounter: Payer: Self-pay | Admitting: Physical Therapy

## 2019-04-11 ENCOUNTER — Ambulatory Visit: Payer: Medicare Other | Admitting: Physical Therapy

## 2019-04-11 DIAGNOSIS — R296 Repeated falls: Secondary | ICD-10-CM

## 2019-04-11 DIAGNOSIS — R2689 Other abnormalities of gait and mobility: Secondary | ICD-10-CM

## 2019-04-11 DIAGNOSIS — R209 Unspecified disturbances of skin sensation: Secondary | ICD-10-CM

## 2019-04-11 DIAGNOSIS — M6281 Muscle weakness (generalized): Secondary | ICD-10-CM

## 2019-04-11 DIAGNOSIS — R609 Edema, unspecified: Secondary | ICD-10-CM

## 2019-04-11 DIAGNOSIS — R262 Difficulty in walking, not elsewhere classified: Secondary | ICD-10-CM
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
## 2019-04-13 ENCOUNTER — Encounter: Payer: Self-pay | Admitting: Physical Therapy

## 2019-04-13 ENCOUNTER — Ambulatory Visit: Payer: Medicare Other | Attending: Family Medicine

## 2019-04-13 ENCOUNTER — Other Ambulatory Visit: Payer: Self-pay

## 2019-04-13 VITALS — BP 177/88 | HR 94

## 2019-04-13 DIAGNOSIS — R609 Edema, unspecified: Secondary | ICD-10-CM | POA: Insufficient documentation

## 2019-04-13 DIAGNOSIS — R262 Difficulty in walking, not elsewhere classified: Secondary | ICD-10-CM | POA: Diagnosis present

## 2019-04-13 DIAGNOSIS — R209 Unspecified disturbances of skin sensation: Secondary | ICD-10-CM | POA: Diagnosis present

## 2019-04-13 DIAGNOSIS — R2689 Other abnormalities of gait and mobility: Secondary | ICD-10-CM | POA: Diagnosis present

## 2019-04-13 DIAGNOSIS — M6281 Muscle weakness (generalized): Secondary | ICD-10-CM | POA: Diagnosis present

## 2019-04-13 DIAGNOSIS — R296 Repeated falls: Secondary | ICD-10-CM | POA: Diagnosis present
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
## 2019-04-18 ENCOUNTER — Ambulatory Visit: Payer: Medicare Other | Admitting: Physical Therapy

## 2019-04-20 ENCOUNTER — Ambulatory Visit: Payer: Medicare Other | Admitting: Physical Therapy

## 2019-04-25 ENCOUNTER — Other Ambulatory Visit: Payer: Self-pay

## 2019-04-25 ENCOUNTER — Ambulatory Visit: Payer: Medicare Other | Admitting: Physical Therapy

## 2019-04-25 ENCOUNTER — Encounter: Payer: Self-pay | Admitting: Physical Therapy

## 2019-04-25 VITALS — BP 128/88

## 2019-04-25 DIAGNOSIS — M6281 Muscle weakness (generalized): Secondary | ICD-10-CM

## 2019-04-25 DIAGNOSIS — R609 Edema, unspecified: Secondary | ICD-10-CM

## 2019-04-25 DIAGNOSIS — R296 Repeated falls: Secondary | ICD-10-CM

## 2019-04-25 DIAGNOSIS — R2689 Other abnormalities of gait and mobility: Secondary | ICD-10-CM

## 2019-04-25 DIAGNOSIS — R262 Difficulty in walking, not elsewhere classified: Secondary | ICD-10-CM

## 2019-04-25 DIAGNOSIS — R209 Unspecified disturbances of skin sensation: Secondary | ICD-10-CM
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
## 2019-04-27 ENCOUNTER — Ambulatory Visit: Payer: Medicare Other | Admitting: Physical Therapy

## 2019-04-27 ENCOUNTER — Other Ambulatory Visit: Payer: Self-pay

## 2019-04-27 ENCOUNTER — Encounter: Payer: Self-pay | Admitting: Physical Therapy

## 2019-04-27 VITALS — BP 130/90

## 2019-04-27 DIAGNOSIS — R262 Difficulty in walking, not elsewhere classified: Secondary | ICD-10-CM

## 2019-04-27 DIAGNOSIS — R296 Repeated falls: Secondary | ICD-10-CM | POA: Diagnosis not present

## 2019-04-27 DIAGNOSIS — R609 Edema, unspecified: Secondary | ICD-10-CM

## 2019-04-27 DIAGNOSIS — R2689 Other abnormalities of gait and mobility: Secondary | ICD-10-CM

## 2019-04-27 DIAGNOSIS — R209 Unspecified disturbances of skin sensation: Secondary | ICD-10-CM

## 2019-04-27 DIAGNOSIS — M6281 Muscle weakness (generalized): Secondary | ICD-10-CM
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
## 2019-05-02 ENCOUNTER — Encounter: Payer: Self-pay | Admitting: Physical Therapy

## 2019-05-02 ENCOUNTER — Ambulatory Visit: Payer: Medicare Other | Admitting: Physical Therapy

## 2019-05-02 ENCOUNTER — Other Ambulatory Visit: Payer: Self-pay

## 2019-05-02 VITALS — BP 166/88

## 2019-05-02 DIAGNOSIS — M6281 Muscle weakness (generalized): Secondary | ICD-10-CM

## 2019-05-02 DIAGNOSIS — R209 Unspecified disturbances of skin sensation: Secondary | ICD-10-CM

## 2019-05-02 DIAGNOSIS — R2689 Other abnormalities of gait and mobility: Secondary | ICD-10-CM

## 2019-05-02 DIAGNOSIS — R296 Repeated falls: Secondary | ICD-10-CM

## 2019-05-02 DIAGNOSIS — R609 Edema, unspecified: Secondary | ICD-10-CM

## 2019-05-02 DIAGNOSIS — R262 Difficulty in walking, not elsewhere classified: Secondary | ICD-10-CM
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
## 2019-05-04 ENCOUNTER — Other Ambulatory Visit: Payer: Self-pay

## 2019-05-04 ENCOUNTER — Encounter: Payer: Self-pay | Admitting: Physical Therapy

## 2019-05-04 ENCOUNTER — Ambulatory Visit: Payer: Medicare Other | Admitting: Physical Therapy

## 2019-05-04 VITALS — BP 152/96

## 2019-05-04 DIAGNOSIS — R296 Repeated falls: Secondary | ICD-10-CM | POA: Diagnosis not present

## 2019-05-04 DIAGNOSIS — R262 Difficulty in walking, not elsewhere classified: Secondary | ICD-10-CM

## 2019-05-04 DIAGNOSIS — R2689 Other abnormalities of gait and mobility: Secondary | ICD-10-CM

## 2019-05-04 DIAGNOSIS — R209 Unspecified disturbances of skin sensation: Secondary | ICD-10-CM

## 2019-05-04 DIAGNOSIS — R609 Edema, unspecified: Secondary | ICD-10-CM

## 2019-05-04 DIAGNOSIS — M6281 Muscle weakness (generalized): Secondary | ICD-10-CM
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
## 2019-05-09 ENCOUNTER — Ambulatory Visit: Payer: Medicare Other | Admitting: Physical Therapy

## 2019-05-09 ENCOUNTER — Other Ambulatory Visit: Payer: Self-pay

## 2019-05-09 ENCOUNTER — Encounter: Payer: Self-pay | Admitting: Physical Therapy

## 2019-05-09 VITALS — BP 136/72

## 2019-05-09 DIAGNOSIS — R296 Repeated falls: Secondary | ICD-10-CM

## 2019-05-09 DIAGNOSIS — R609 Edema, unspecified: Secondary | ICD-10-CM

## 2019-05-09 DIAGNOSIS — R262 Difficulty in walking, not elsewhere classified: Secondary | ICD-10-CM

## 2019-05-09 DIAGNOSIS — M6281 Muscle weakness (generalized): Secondary | ICD-10-CM

## 2019-05-09 DIAGNOSIS — R209 Unspecified disturbances of skin sensation: Secondary | ICD-10-CM

## 2019-05-09 DIAGNOSIS — R2689 Other abnormalities of gait and mobility: Secondary | ICD-10-CM
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
## 2019-05-11 ENCOUNTER — Encounter: Payer: Self-pay | Admitting: Physical Therapy

## 2019-05-11 ENCOUNTER — Ambulatory Visit: Payer: Medicare Other | Admitting: Physical Therapy

## 2019-05-11 ENCOUNTER — Other Ambulatory Visit: Payer: Self-pay

## 2019-05-11 VITALS — BP 132/84

## 2019-05-11 DIAGNOSIS — M6281 Muscle weakness (generalized): Secondary | ICD-10-CM

## 2019-05-11 DIAGNOSIS — R296 Repeated falls: Secondary | ICD-10-CM

## 2019-05-11 DIAGNOSIS — R2689 Other abnormalities of gait and mobility: Secondary | ICD-10-CM

## 2019-05-11 DIAGNOSIS — R262 Difficulty in walking, not elsewhere classified: Secondary | ICD-10-CM

## 2019-05-11 DIAGNOSIS — R609 Edema, unspecified: Secondary | ICD-10-CM

## 2019-05-11 DIAGNOSIS — R209 Unspecified disturbances of skin sensation: Secondary | ICD-10-CM
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
## 2019-05-16 ENCOUNTER — Encounter: Payer: Self-pay | Admitting: Physical Therapy

## 2019-05-16 ENCOUNTER — Ambulatory Visit: Payer: Medicare Other | Attending: Family Medicine | Admitting: Physical Therapy

## 2019-05-16 ENCOUNTER — Other Ambulatory Visit: Payer: Self-pay

## 2019-05-16 VITALS — BP 154/88

## 2019-05-16 DIAGNOSIS — R262 Difficulty in walking, not elsewhere classified: Secondary | ICD-10-CM | POA: Diagnosis present

## 2019-05-16 DIAGNOSIS — R209 Unspecified disturbances of skin sensation: Secondary | ICD-10-CM | POA: Diagnosis present

## 2019-05-16 DIAGNOSIS — R609 Edema, unspecified: Secondary | ICD-10-CM | POA: Diagnosis present

## 2019-05-16 DIAGNOSIS — R296 Repeated falls: Secondary | ICD-10-CM

## 2019-05-16 DIAGNOSIS — M6281 Muscle weakness (generalized): Secondary | ICD-10-CM | POA: Diagnosis present

## 2019-05-16 DIAGNOSIS — R2689 Other abnormalities of gait and mobility: Secondary | ICD-10-CM | POA: Diagnosis present
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
## 2019-05-18 ENCOUNTER — Ambulatory Visit: Payer: Medicare Other | Admitting: Physical Therapy

## 2019-05-18 ENCOUNTER — Encounter: Payer: Self-pay | Admitting: Physical Therapy

## 2019-05-18 ENCOUNTER — Other Ambulatory Visit: Payer: Self-pay

## 2019-05-18 VITALS — BP 127/70

## 2019-05-18 DIAGNOSIS — R209 Unspecified disturbances of skin sensation: Secondary | ICD-10-CM

## 2019-05-18 DIAGNOSIS — M6281 Muscle weakness (generalized): Secondary | ICD-10-CM

## 2019-05-18 DIAGNOSIS — R262 Difficulty in walking, not elsewhere classified: Secondary | ICD-10-CM

## 2019-05-18 DIAGNOSIS — R296 Repeated falls: Secondary | ICD-10-CM

## 2019-05-18 DIAGNOSIS — R609 Edema, unspecified: Secondary | ICD-10-CM

## 2019-05-18 DIAGNOSIS — R2689 Other abnormalities of gait and mobility: Secondary | ICD-10-CM
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
## 2019-05-23 ENCOUNTER — Ambulatory Visit: Payer: Medicare Other | Admitting: Physical Therapy

## 2019-05-23 ENCOUNTER — Encounter: Payer: Self-pay | Admitting: Physical Therapy

## 2019-05-23 ENCOUNTER — Other Ambulatory Visit: Payer: Self-pay

## 2019-05-23 VITALS — BP 140/80

## 2019-05-23 DIAGNOSIS — R609 Edema, unspecified: Secondary | ICD-10-CM

## 2019-05-23 DIAGNOSIS — R296 Repeated falls: Secondary | ICD-10-CM | POA: Diagnosis not present

## 2019-05-23 DIAGNOSIS — M6281 Muscle weakness (generalized): Secondary | ICD-10-CM

## 2019-05-23 DIAGNOSIS — R209 Unspecified disturbances of skin sensation: Secondary | ICD-10-CM

## 2019-05-23 DIAGNOSIS — R262 Difficulty in walking, not elsewhere classified: Secondary | ICD-10-CM

## 2019-05-23 DIAGNOSIS — R2689 Other abnormalities of gait and mobility: Secondary | ICD-10-CM
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
## 2019-05-25 ENCOUNTER — Encounter: Payer: Self-pay | Admitting: Physical Therapy

## 2019-05-25 ENCOUNTER — Ambulatory Visit: Payer: Medicare Other | Admitting: Physical Therapy

## 2019-05-25 ENCOUNTER — Other Ambulatory Visit: Payer: Self-pay

## 2019-05-25 VITALS — BP 150/78

## 2019-05-25 DIAGNOSIS — R296 Repeated falls: Secondary | ICD-10-CM | POA: Diagnosis not present

## 2019-05-25 DIAGNOSIS — R209 Unspecified disturbances of skin sensation: Secondary | ICD-10-CM

## 2019-05-25 DIAGNOSIS — R262 Difficulty in walking, not elsewhere classified: Secondary | ICD-10-CM

## 2019-05-25 DIAGNOSIS — R609 Edema, unspecified: Secondary | ICD-10-CM

## 2019-05-25 DIAGNOSIS — M6281 Muscle weakness (generalized): Secondary | ICD-10-CM

## 2019-05-25 DIAGNOSIS — R2689 Other abnormalities of gait and mobility: Secondary | ICD-10-CM
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
## 2019-05-30 ENCOUNTER — Other Ambulatory Visit: Payer: Self-pay

## 2019-05-30 ENCOUNTER — Encounter: Payer: Self-pay | Admitting: Physical Therapy

## 2019-05-30 ENCOUNTER — Ambulatory Visit: Payer: Medicare Other | Admitting: Physical Therapy

## 2019-05-30 DIAGNOSIS — R262 Difficulty in walking, not elsewhere classified: Secondary | ICD-10-CM

## 2019-05-30 DIAGNOSIS — M6281 Muscle weakness (generalized): Secondary | ICD-10-CM

## 2019-05-30 DIAGNOSIS — R209 Unspecified disturbances of skin sensation: Secondary | ICD-10-CM

## 2019-05-30 DIAGNOSIS — R296 Repeated falls: Secondary | ICD-10-CM | POA: Diagnosis not present

## 2019-05-30 DIAGNOSIS — R609 Edema, unspecified: Secondary | ICD-10-CM

## 2019-05-30 DIAGNOSIS — R2689 Other abnormalities of gait and mobility: Secondary | ICD-10-CM
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
## 2019-06-01 ENCOUNTER — Encounter: Payer: Self-pay | Admitting: Physical Therapy

## 2019-06-01 ENCOUNTER — Ambulatory Visit: Payer: Medicare Other | Admitting: Physical Therapy

## 2019-06-01 ENCOUNTER — Other Ambulatory Visit: Payer: Self-pay

## 2019-06-01 VITALS — BP 148/78

## 2019-06-01 DIAGNOSIS — R609 Edema, unspecified: Secondary | ICD-10-CM

## 2019-06-01 DIAGNOSIS — R262 Difficulty in walking, not elsewhere classified: Secondary | ICD-10-CM

## 2019-06-01 DIAGNOSIS — M6281 Muscle weakness (generalized): Secondary | ICD-10-CM

## 2019-06-01 DIAGNOSIS — R296 Repeated falls: Secondary | ICD-10-CM | POA: Diagnosis not present

## 2019-06-01 DIAGNOSIS — R2689 Other abnormalities of gait and mobility: Secondary | ICD-10-CM

## 2019-06-01 DIAGNOSIS — R209 Unspecified disturbances of skin sensation: Secondary | ICD-10-CM
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
## 2019-06-06 ENCOUNTER — Encounter: Payer: Self-pay | Admitting: Physical Therapy

## 2019-06-06 ENCOUNTER — Ambulatory Visit: Payer: Medicare Other | Admitting: Physical Therapy

## 2019-06-06 ENCOUNTER — Other Ambulatory Visit: Payer: Self-pay

## 2019-06-06 DIAGNOSIS — R296 Repeated falls: Secondary | ICD-10-CM

## 2019-06-06 DIAGNOSIS — M6281 Muscle weakness (generalized): Secondary | ICD-10-CM

## 2019-06-06 DIAGNOSIS — R262 Difficulty in walking, not elsewhere classified: Secondary | ICD-10-CM

## 2019-06-06 DIAGNOSIS — R2689 Other abnormalities of gait and mobility: Secondary | ICD-10-CM

## 2019-06-06 DIAGNOSIS — R609 Edema, unspecified: Secondary | ICD-10-CM

## 2019-06-06 DIAGNOSIS — R209 Unspecified disturbances of skin sensation: Secondary | ICD-10-CM
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
## 2019-06-07 ENCOUNTER — Ambulatory Visit: Payer: Medicare Other | Admitting: Physical Therapy

## 2019-06-08 ENCOUNTER — Other Ambulatory Visit: Payer: Self-pay

## 2019-06-08 ENCOUNTER — Encounter: Payer: Disability Insurance | Admitting: Physical Therapy

## 2019-06-08 ENCOUNTER — Ambulatory Visit: Payer: Medicare Other | Admitting: Physical Therapy

## 2019-06-08 VITALS — BP 122/78 | HR 106

## 2019-06-08 DIAGNOSIS — R296 Repeated falls: Secondary | ICD-10-CM

## 2019-06-08 DIAGNOSIS — R609 Edema, unspecified: Secondary | ICD-10-CM

## 2019-06-08 DIAGNOSIS — R209 Unspecified disturbances of skin sensation: Secondary | ICD-10-CM

## 2019-06-08 DIAGNOSIS — M6281 Muscle weakness (generalized): Secondary | ICD-10-CM

## 2019-06-08 DIAGNOSIS — R2689 Other abnormalities of gait and mobility: Secondary | ICD-10-CM

## 2019-06-08 DIAGNOSIS — R262 Difficulty in walking, not elsewhere classified: Secondary | ICD-10-CM
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
## 2019-06-09 ENCOUNTER — Encounter: Payer: Self-pay | Admitting: Physical Therapy

## 2019-06-13 ENCOUNTER — Other Ambulatory Visit: Payer: Self-pay

## 2019-06-13 ENCOUNTER — Ambulatory Visit: Payer: Medicare Other | Attending: Family Medicine | Admitting: Physical Therapy

## 2019-06-13 ENCOUNTER — Encounter: Payer: Self-pay | Admitting: Physical Therapy

## 2019-06-13 DIAGNOSIS — R209 Unspecified disturbances of skin sensation: Secondary | ICD-10-CM | POA: Diagnosis present

## 2019-06-13 DIAGNOSIS — R296 Repeated falls: Secondary | ICD-10-CM | POA: Diagnosis present

## 2019-06-13 DIAGNOSIS — R2689 Other abnormalities of gait and mobility: Secondary | ICD-10-CM

## 2019-06-13 DIAGNOSIS — R262 Difficulty in walking, not elsewhere classified: Secondary | ICD-10-CM | POA: Diagnosis present

## 2019-06-13 DIAGNOSIS — R609 Edema, unspecified: Secondary | ICD-10-CM | POA: Insufficient documentation

## 2019-06-13 DIAGNOSIS — M6281 Muscle weakness (generalized): Secondary | ICD-10-CM
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
## 2019-06-15 ENCOUNTER — Other Ambulatory Visit: Payer: Self-pay

## 2019-06-15 ENCOUNTER — Ambulatory Visit: Payer: Medicare Other | Admitting: Physical Therapy

## 2019-06-15 ENCOUNTER — Encounter: Payer: Self-pay | Admitting: Physical Therapy

## 2019-06-15 DIAGNOSIS — R296 Repeated falls: Secondary | ICD-10-CM

## 2019-06-15 DIAGNOSIS — R2689 Other abnormalities of gait and mobility: Secondary | ICD-10-CM

## 2019-06-15 DIAGNOSIS — R609 Edema, unspecified: Secondary | ICD-10-CM

## 2019-06-15 DIAGNOSIS — R209 Unspecified disturbances of skin sensation: Secondary | ICD-10-CM

## 2019-06-15 DIAGNOSIS — R262 Difficulty in walking, not elsewhere classified: Secondary | ICD-10-CM

## 2019-06-15 DIAGNOSIS — M6281 Muscle weakness (generalized): Secondary | ICD-10-CM
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
## 2019-06-20 ENCOUNTER — Ambulatory Visit: Payer: Medicare Other

## 2019-06-20 ENCOUNTER — Other Ambulatory Visit: Payer: Self-pay

## 2019-06-20 ENCOUNTER — Encounter: Payer: Self-pay | Admitting: Physical Therapy

## 2019-06-20 DIAGNOSIS — M6281 Muscle weakness (generalized): Secondary | ICD-10-CM

## 2019-06-20 DIAGNOSIS — R262 Difficulty in walking, not elsewhere classified: Secondary | ICD-10-CM

## 2019-06-20 DIAGNOSIS — R296 Repeated falls: Secondary | ICD-10-CM

## 2019-06-20 DIAGNOSIS — R2689 Other abnormalities of gait and mobility: Secondary | ICD-10-CM
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
## 2019-06-22 ENCOUNTER — Ambulatory Visit: Payer: Medicare Other

## 2019-06-22 ENCOUNTER — Encounter: Payer: Self-pay | Admitting: Physical Therapy

## 2019-06-22 ENCOUNTER — Other Ambulatory Visit: Payer: Self-pay

## 2019-06-22 DIAGNOSIS — R2689 Other abnormalities of gait and mobility: Secondary | ICD-10-CM

## 2019-06-22 DIAGNOSIS — R209 Unspecified disturbances of skin sensation: Secondary | ICD-10-CM

## 2019-06-22 DIAGNOSIS — M6281 Muscle weakness (generalized): Secondary | ICD-10-CM

## 2019-06-22 DIAGNOSIS — R296 Repeated falls: Secondary | ICD-10-CM | POA: Diagnosis not present

## 2019-06-22 DIAGNOSIS — R609 Edema, unspecified: Secondary | ICD-10-CM

## 2019-06-22 DIAGNOSIS — R262 Difficulty in walking, not elsewhere classified: Secondary | ICD-10-CM
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
## 2019-06-27 ENCOUNTER — Other Ambulatory Visit: Payer: Self-pay

## 2019-06-27 ENCOUNTER — Ambulatory Visit: Payer: Medicare Other | Admitting: Physical Therapy

## 2019-06-27 ENCOUNTER — Encounter: Payer: Self-pay | Admitting: Physical Therapy

## 2019-06-27 VITALS — BP 120/80 | HR 119

## 2019-06-27 DIAGNOSIS — M6281 Muscle weakness (generalized): Secondary | ICD-10-CM

## 2019-06-27 DIAGNOSIS — R609 Edema, unspecified: Secondary | ICD-10-CM

## 2019-06-27 DIAGNOSIS — R262 Difficulty in walking, not elsewhere classified: Secondary | ICD-10-CM

## 2019-06-27 DIAGNOSIS — R2689 Other abnormalities of gait and mobility: Secondary | ICD-10-CM

## 2019-06-27 DIAGNOSIS — R209 Unspecified disturbances of skin sensation: Secondary | ICD-10-CM

## 2019-06-27 DIAGNOSIS — R296 Repeated falls: Secondary | ICD-10-CM | POA: Diagnosis not present
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
## 2019-06-29 ENCOUNTER — Ambulatory Visit: Payer: Medicare Other | Admitting: Physical Therapy

## 2019-07-04 ENCOUNTER — Encounter: Payer: Self-pay | Admitting: Physical Therapy

## 2019-07-04 ENCOUNTER — Ambulatory Visit: Payer: Medicare Other | Admitting: Physical Therapy

## 2019-07-04 ENCOUNTER — Other Ambulatory Visit: Payer: Self-pay

## 2019-07-04 DIAGNOSIS — R262 Difficulty in walking, not elsewhere classified: Secondary | ICD-10-CM

## 2019-07-04 DIAGNOSIS — M6281 Muscle weakness (generalized): Secondary | ICD-10-CM

## 2019-07-04 DIAGNOSIS — R296 Repeated falls: Secondary | ICD-10-CM

## 2019-07-04 DIAGNOSIS — R209 Unspecified disturbances of skin sensation: Secondary | ICD-10-CM

## 2019-07-04 DIAGNOSIS — R2689 Other abnormalities of gait and mobility: Secondary | ICD-10-CM

## 2019-07-04 DIAGNOSIS — R609 Edema, unspecified: Secondary | ICD-10-CM
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
## 2019-07-06 ENCOUNTER — Ambulatory Visit: Payer: Medicare Other | Admitting: Physical Therapy

## 2019-07-06 ENCOUNTER — Encounter: Payer: Self-pay | Admitting: Physical Therapy

## 2019-07-06 ENCOUNTER — Other Ambulatory Visit: Payer: Self-pay

## 2019-07-06 DIAGNOSIS — R2689 Other abnormalities of gait and mobility: Secondary | ICD-10-CM

## 2019-07-06 DIAGNOSIS — M6281 Muscle weakness (generalized): Secondary | ICD-10-CM

## 2019-07-06 DIAGNOSIS — R296 Repeated falls: Secondary | ICD-10-CM

## 2019-07-06 DIAGNOSIS — R262 Difficulty in walking, not elsewhere classified: Secondary | ICD-10-CM
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
## 2019-07-11 ENCOUNTER — Ambulatory Visit: Payer: Medicare Other | Admitting: Physical Therapy

## 2019-07-13 ENCOUNTER — Other Ambulatory Visit: Payer: Self-pay

## 2019-07-13 ENCOUNTER — Encounter: Payer: Self-pay | Admitting: Physical Therapy

## 2019-07-13 ENCOUNTER — Ambulatory Visit: Payer: Medicare Other | Attending: Family Medicine | Admitting: Physical Therapy

## 2019-07-13 DIAGNOSIS — R262 Difficulty in walking, not elsewhere classified: Secondary | ICD-10-CM | POA: Insufficient documentation

## 2019-07-13 DIAGNOSIS — R209 Unspecified disturbances of skin sensation: Secondary | ICD-10-CM | POA: Insufficient documentation

## 2019-07-13 DIAGNOSIS — R2689 Other abnormalities of gait and mobility: Secondary | ICD-10-CM

## 2019-07-13 DIAGNOSIS — M6281 Muscle weakness (generalized): Secondary | ICD-10-CM | POA: Diagnosis present

## 2019-07-13 DIAGNOSIS — R609 Edema, unspecified: Secondary | ICD-10-CM | POA: Diagnosis present

## 2019-07-13 DIAGNOSIS — R296 Repeated falls: Secondary | ICD-10-CM | POA: Diagnosis not present
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
## 2019-07-18 ENCOUNTER — Encounter: Payer: Self-pay | Admitting: Physical Therapy

## 2019-07-18 ENCOUNTER — Other Ambulatory Visit: Payer: Self-pay

## 2019-07-18 ENCOUNTER — Ambulatory Visit: Payer: Medicare Other | Admitting: Physical Therapy

## 2019-07-18 DIAGNOSIS — R262 Difficulty in walking, not elsewhere classified: Secondary | ICD-10-CM

## 2019-07-18 DIAGNOSIS — R2689 Other abnormalities of gait and mobility: Secondary | ICD-10-CM

## 2019-07-18 DIAGNOSIS — R296 Repeated falls: Secondary | ICD-10-CM | POA: Diagnosis not present

## 2019-07-18 DIAGNOSIS — M6281 Muscle weakness (generalized): Secondary | ICD-10-CM
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
## 2019-07-20 ENCOUNTER — Encounter: Payer: Self-pay | Admitting: Physical Therapy

## 2019-07-20 ENCOUNTER — Ambulatory Visit: Payer: Medicare Other | Admitting: Physical Therapy

## 2019-07-20 ENCOUNTER — Other Ambulatory Visit: Payer: Self-pay

## 2019-07-20 DIAGNOSIS — R262 Difficulty in walking, not elsewhere classified: Secondary | ICD-10-CM

## 2019-07-20 DIAGNOSIS — R609 Edema, unspecified: Secondary | ICD-10-CM

## 2019-07-20 DIAGNOSIS — M6281 Muscle weakness (generalized): Secondary | ICD-10-CM

## 2019-07-20 DIAGNOSIS — R2689 Other abnormalities of gait and mobility: Secondary | ICD-10-CM

## 2019-07-20 DIAGNOSIS — R296 Repeated falls: Secondary | ICD-10-CM

## 2019-07-20 DIAGNOSIS — R209 Unspecified disturbances of skin sensation: Secondary | ICD-10-CM
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
## 2019-07-25 ENCOUNTER — Encounter: Payer: Self-pay | Admitting: Physical Therapy

## 2019-07-25 ENCOUNTER — Other Ambulatory Visit: Payer: Self-pay

## 2019-07-25 ENCOUNTER — Ambulatory Visit: Payer: Medicare Other | Admitting: Physical Therapy

## 2019-07-25 VITALS — BP 136/78

## 2019-07-25 DIAGNOSIS — R609 Edema, unspecified: Secondary | ICD-10-CM

## 2019-07-25 DIAGNOSIS — M6281 Muscle weakness (generalized): Secondary | ICD-10-CM

## 2019-07-25 DIAGNOSIS — R296 Repeated falls: Secondary | ICD-10-CM | POA: Diagnosis not present

## 2019-07-25 DIAGNOSIS — R2689 Other abnormalities of gait and mobility: Secondary | ICD-10-CM

## 2019-07-25 DIAGNOSIS — R262 Difficulty in walking, not elsewhere classified: Secondary | ICD-10-CM

## 2019-07-25 DIAGNOSIS — R209 Unspecified disturbances of skin sensation: Secondary | ICD-10-CM
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
## 2019-07-27 ENCOUNTER — Other Ambulatory Visit: Payer: Self-pay

## 2019-07-27 ENCOUNTER — Ambulatory Visit: Payer: Medicare Other | Admitting: Physical Therapy

## 2019-07-27 DIAGNOSIS — R296 Repeated falls: Secondary | ICD-10-CM | POA: Diagnosis not present

## 2019-07-27 DIAGNOSIS — R2689 Other abnormalities of gait and mobility: Secondary | ICD-10-CM

## 2019-07-27 DIAGNOSIS — R609 Edema, unspecified: Secondary | ICD-10-CM

## 2019-07-27 DIAGNOSIS — R209 Unspecified disturbances of skin sensation: Secondary | ICD-10-CM

## 2019-07-27 DIAGNOSIS — M6281 Muscle weakness (generalized): Secondary | ICD-10-CM

## 2019-07-27 DIAGNOSIS — R262 Difficulty in walking, not elsewhere classified: Secondary | ICD-10-CM
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
## 2019-08-01 ENCOUNTER — Encounter: Payer: Disability Insurance | Admitting: Physical Therapy

## 2019-08-03 ENCOUNTER — Encounter: Payer: Disability Insurance | Admitting: Physical Therapy

## 2019-08-08 ENCOUNTER — Encounter: Payer: Disability Insurance | Admitting: Physical Therapy

## 2019-08-10 ENCOUNTER — Encounter: Payer: Disability Insurance | Admitting: Physical Therapy

## 2019-08-15 ENCOUNTER — Encounter: Payer: Disability Insurance | Admitting: Physical Therapy

## 2019-08-17 ENCOUNTER — Encounter: Payer: Disability Insurance | Admitting: Physical Therapy

## 2019-08-22 ENCOUNTER — Encounter: Payer: Disability Insurance | Admitting: Physical Therapy

## 2019-08-22 DIAGNOSIS — Z0000 Encounter for general adult medical examination without abnormal findings: Secondary | ICD-10-CM | POA: Insufficient documentation

## 2019-08-24 ENCOUNTER — Encounter: Payer: Disability Insurance | Admitting: Physical Therapy

## 2019-08-29 ENCOUNTER — Encounter: Payer: Disability Insurance | Admitting: Physical Therapy

## 2019-08-31 ENCOUNTER — Encounter: Payer: Disability Insurance | Admitting: Physical Therapy

## 2019-09-05 ENCOUNTER — Encounter: Payer: Disability Insurance | Admitting: Physical Therapy

## 2019-09-11 ENCOUNTER — Encounter: Payer: Disability Insurance | Admitting: Physical Therapy

## 2020-04-27 ENCOUNTER — Other Ambulatory Visit: Payer: Self-pay

## 2020-04-27 ENCOUNTER — Emergency Department: Payer: Medicare Other

## 2020-04-27 ENCOUNTER — Emergency Department
Admission: EM | Admit: 2020-04-27 | Discharge: 2020-04-27 | Disposition: A | Discharge status: Home / Self Care | Payer: Medicare Other | Attending: Emergency Medicine | Admitting: Emergency Medicine

## 2020-04-27 ENCOUNTER — Encounter: Payer: Self-pay | Admitting: Emergency Medicine

## 2020-04-27 DIAGNOSIS — Y939 Activity, unspecified: Secondary | ICD-10-CM | POA: Insufficient documentation

## 2020-04-27 DIAGNOSIS — W19XXXA Unspecified fall, initial encounter: Secondary | ICD-10-CM | POA: Diagnosis not present

## 2020-04-27 DIAGNOSIS — S82402A Unspecified fracture of shaft of left fibula, initial encounter for closed fracture: Secondary | ICD-10-CM

## 2020-04-27 DIAGNOSIS — E119 Type 2 diabetes mellitus without complications: Secondary | ICD-10-CM | POA: Diagnosis not present

## 2020-04-27 DIAGNOSIS — Y929 Unspecified place or not applicable: Secondary | ICD-10-CM | POA: Diagnosis not present

## 2020-04-27 DIAGNOSIS — Z7982 Long term (current) use of aspirin: Secondary | ICD-10-CM | POA: Diagnosis not present

## 2020-04-27 DIAGNOSIS — Y999 Unspecified external cause status: Secondary | ICD-10-CM | POA: Diagnosis not present

## 2020-04-27 DIAGNOSIS — S82102A Unspecified fracture of upper end of left tibia, initial encounter for closed fracture: Secondary | ICD-10-CM | POA: Diagnosis not present

## 2020-04-27 DIAGNOSIS — Z794 Long term (current) use of insulin: Secondary | ICD-10-CM | POA: Diagnosis not present

## 2020-04-27 DIAGNOSIS — Z79899 Other long term (current) drug therapy: Secondary | ICD-10-CM | POA: Diagnosis not present

## 2020-04-27 DIAGNOSIS — Z87891 Personal history of nicotine dependence: Secondary | ICD-10-CM | POA: Insufficient documentation

## 2020-04-27 DIAGNOSIS — S8992XA Unspecified injury of left lower leg, initial encounter: Secondary | ICD-10-CM | POA: Diagnosis present

## 2020-04-27 DIAGNOSIS — S82202A Unspecified fracture of shaft of left tibia, initial encounter for closed fracture: Secondary | ICD-10-CM

## 2020-04-27 NOTE — ED Notes (Signed)
Pt states pain is managed by his out pt clinic.
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
## 2020-04-27 NOTE — Discharge Instructions (Addendum)
Follow-up with [facility] clinic orthopedics.  Please call for an appointment. Take care oxycodone 5 to 10 mg every 4-6 hours as needed for pain Elevate and ice the leg. I recommend that you do not take a trip this week.  You will need to keep the leg elevated and this would be difficult in a vehicle.

## 2020-04-27 NOTE — ED Notes (Signed)
Splint applied by ED tech. Pt wheeled to lobby.
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
## 2020-04-27 NOTE — ED Notes (Signed)
Patient's 54 year-old daughter picked up by family friend, Raphael Gibney [phone]. Daughter was with the patient upon arrival to the ED.

## 2020-04-30 ENCOUNTER — Telehealth: Payer: Self-pay | Admitting: Physical Therapy
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
## 2020-05-27 DIAGNOSIS — Z794 Long term (current) use of insulin: Secondary | ICD-10-CM | POA: Insufficient documentation

## 2020-05-27 DIAGNOSIS — Z9114 Patient's other noncompliance with medication regimen: Secondary | ICD-10-CM | POA: Insufficient documentation

## 2020-06-26 ENCOUNTER — Ambulatory Visit: Payer: Medicare Other | Attending: Student | Admitting: Physical Therapy

## 2020-06-26 ENCOUNTER — Other Ambulatory Visit: Payer: Self-pay

## 2020-06-26 ENCOUNTER — Encounter: Payer: Self-pay | Admitting: Physical Therapy

## 2020-06-26 VITALS — BP 160/88 | HR 96

## 2020-06-26 DIAGNOSIS — M79605 Pain in left leg: Secondary | ICD-10-CM | POA: Insufficient documentation

## 2020-06-26 DIAGNOSIS — M6281 Muscle weakness (generalized): Secondary | ICD-10-CM | POA: Diagnosis present

## 2020-06-26 DIAGNOSIS — R262 Difficulty in walking, not elsewhere classified: Secondary | ICD-10-CM | POA: Diagnosis present

## 2020-06-26 DIAGNOSIS — R296 Repeated falls: Secondary | ICD-10-CM | POA: Insufficient documentation
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
## 2020-06-26 NOTE — Progress Notes (Signed)
Pictures of patient's L LE on 06/26/2020 to track changes.

## 2020-07-02 ENCOUNTER — Encounter: Payer: Self-pay | Admitting: Physical Therapy

## 2020-07-02 ENCOUNTER — Other Ambulatory Visit: Payer: Self-pay

## 2020-07-02 ENCOUNTER — Ambulatory Visit: Payer: Medicare Other | Admitting: Physical Therapy

## 2020-07-02 DIAGNOSIS — M79605 Pain in left leg: Secondary | ICD-10-CM

## 2020-07-02 DIAGNOSIS — M6281 Muscle weakness (generalized): Secondary | ICD-10-CM

## 2020-07-02 DIAGNOSIS — R262 Difficulty in walking, not elsewhere classified: Secondary | ICD-10-CM

## 2020-07-02 DIAGNOSIS — R296 Repeated falls: Secondary | ICD-10-CM

## 2020-07-02 NOTE — Progress Notes (Signed)
Pictures of pt's L LE to track progress. 07/02/2020  Shawn Rivas. [name], PT, DPT 07/02/20, 10:26 AM
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
## 2020-07-04 ENCOUNTER — Encounter: Payer: Self-pay | Admitting: Physical Therapy

## 2020-07-04 ENCOUNTER — Other Ambulatory Visit: Payer: Self-pay

## 2020-07-04 ENCOUNTER — Ambulatory Visit: Payer: Medicare Other | Admitting: Physical Therapy

## 2020-07-04 VITALS — BP 180/92 | HR 117

## 2020-07-04 DIAGNOSIS — R262 Difficulty in walking, not elsewhere classified: Secondary | ICD-10-CM

## 2020-07-04 DIAGNOSIS — M79605 Pain in left leg: Secondary | ICD-10-CM

## 2020-07-04 DIAGNOSIS — R296 Repeated falls: Secondary | ICD-10-CM

## 2020-07-04 DIAGNOSIS — M6281 Muscle weakness (generalized): Secondary | ICD-10-CM
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
## 2020-07-09 ENCOUNTER — Other Ambulatory Visit: Payer: Self-pay

## 2020-07-09 ENCOUNTER — Encounter: Payer: Self-pay | Admitting: Physical Therapy

## 2020-07-09 ENCOUNTER — Ambulatory Visit: Payer: Medicare Other | Admitting: Physical Therapy

## 2020-07-09 DIAGNOSIS — M79605 Pain in left leg: Secondary | ICD-10-CM

## 2020-07-09 DIAGNOSIS — R262 Difficulty in walking, not elsewhere classified: Secondary | ICD-10-CM

## 2020-07-09 DIAGNOSIS — R296 Repeated falls: Secondary | ICD-10-CM

## 2020-07-09 DIAGNOSIS — M6281 Muscle weakness (generalized): Secondary | ICD-10-CM
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
## 2020-07-11 ENCOUNTER — Encounter: Payer: Self-pay | Admitting: Physical Therapy

## 2020-07-11 ENCOUNTER — Other Ambulatory Visit: Payer: Self-pay

## 2020-07-11 ENCOUNTER — Ambulatory Visit: Payer: Medicare Other | Admitting: Physical Therapy

## 2020-07-11 VITALS — BP 160/94 | HR 116

## 2020-07-11 DIAGNOSIS — M79605 Pain in left leg: Secondary | ICD-10-CM | POA: Diagnosis not present

## 2020-07-11 DIAGNOSIS — R296 Repeated falls: Secondary | ICD-10-CM

## 2020-07-11 DIAGNOSIS — R262 Difficulty in walking, not elsewhere classified: Secondary | ICD-10-CM

## 2020-07-11 DIAGNOSIS — M6281 Muscle weakness (generalized): Secondary | ICD-10-CM
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
## 2020-07-16 ENCOUNTER — Encounter: Payer: Self-pay | Admitting: Physical Therapy

## 2020-07-16 ENCOUNTER — Ambulatory Visit: Payer: Medicare Other | Attending: Student | Admitting: Physical Therapy

## 2020-07-16 ENCOUNTER — Other Ambulatory Visit: Payer: Self-pay

## 2020-07-16 VITALS — BP 158/90 | HR 92

## 2020-07-16 DIAGNOSIS — R209 Unspecified disturbances of skin sensation: Secondary | ICD-10-CM | POA: Diagnosis present

## 2020-07-16 DIAGNOSIS — R296 Repeated falls: Secondary | ICD-10-CM

## 2020-07-16 DIAGNOSIS — M79605 Pain in left leg: Secondary | ICD-10-CM | POA: Diagnosis present

## 2020-07-16 DIAGNOSIS — M6281 Muscle weakness (generalized): Secondary | ICD-10-CM | POA: Diagnosis present

## 2020-07-16 DIAGNOSIS — R2689 Other abnormalities of gait and mobility: Secondary | ICD-10-CM

## 2020-07-16 DIAGNOSIS — R609 Edema, unspecified: Secondary | ICD-10-CM | POA: Diagnosis present

## 2020-07-16 DIAGNOSIS — R262 Difficulty in walking, not elsewhere classified: Secondary | ICD-10-CM
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
## 2020-07-23 ENCOUNTER — Encounter: Payer: Self-pay | Admitting: Physical Therapy

## 2020-07-23 ENCOUNTER — Other Ambulatory Visit: Payer: Self-pay

## 2020-07-23 ENCOUNTER — Ambulatory Visit: Payer: Medicare Other | Admitting: Physical Therapy

## 2020-07-23 VITALS — BP 164/94

## 2020-07-23 DIAGNOSIS — R296 Repeated falls: Secondary | ICD-10-CM

## 2020-07-23 DIAGNOSIS — R262 Difficulty in walking, not elsewhere classified: Secondary | ICD-10-CM

## 2020-07-23 DIAGNOSIS — M79605 Pain in left leg: Secondary | ICD-10-CM

## 2020-07-23 DIAGNOSIS — M6281 Muscle weakness (generalized): Secondary | ICD-10-CM
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
## 2020-07-25 ENCOUNTER — Encounter: Payer: Self-pay | Admitting: Physical Therapy

## 2020-07-25 ENCOUNTER — Ambulatory Visit: Payer: Medicare Other | Admitting: Physical Therapy

## 2020-07-25 ENCOUNTER — Other Ambulatory Visit: Payer: Self-pay

## 2020-07-25 VITALS — BP 150/86 | HR 91

## 2020-07-25 DIAGNOSIS — M79605 Pain in left leg: Secondary | ICD-10-CM

## 2020-07-25 DIAGNOSIS — R296 Repeated falls: Secondary | ICD-10-CM

## 2020-07-25 DIAGNOSIS — M6281 Muscle weakness (generalized): Secondary | ICD-10-CM

## 2020-07-25 DIAGNOSIS — R262 Difficulty in walking, not elsewhere classified: Secondary | ICD-10-CM
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
## 2020-07-29 ENCOUNTER — Other Ambulatory Visit: Payer: Self-pay

## 2020-07-29 ENCOUNTER — Encounter: Payer: Self-pay | Admitting: Physical Therapy

## 2020-07-29 ENCOUNTER — Ambulatory Visit: Payer: Medicare Other | Admitting: Physical Therapy

## 2020-07-29 VITALS — BP 137/90 | HR 91

## 2020-07-29 DIAGNOSIS — R262 Difficulty in walking, not elsewhere classified: Secondary | ICD-10-CM

## 2020-07-29 DIAGNOSIS — R296 Repeated falls: Secondary | ICD-10-CM

## 2020-07-29 DIAGNOSIS — M79605 Pain in left leg: Secondary | ICD-10-CM | POA: Diagnosis not present

## 2020-07-29 DIAGNOSIS — M6281 Muscle weakness (generalized): Secondary | ICD-10-CM
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
## 2020-07-31 ENCOUNTER — Ambulatory Visit: Payer: Medicare Other | Admitting: Physical Therapy

## 2020-08-05 ENCOUNTER — Ambulatory Visit: Payer: Medicare Other | Admitting: Physical Therapy

## 2020-08-05 ENCOUNTER — Other Ambulatory Visit: Payer: Self-pay

## 2020-08-05 ENCOUNTER — Encounter: Payer: Self-pay | Admitting: Physical Therapy

## 2020-08-05 VITALS — BP 180/98 | HR 91

## 2020-08-05 DIAGNOSIS — M79605 Pain in left leg: Secondary | ICD-10-CM

## 2020-08-05 DIAGNOSIS — M6281 Muscle weakness (generalized): Secondary | ICD-10-CM

## 2020-08-05 DIAGNOSIS — R296 Repeated falls: Secondary | ICD-10-CM

## 2020-08-05 DIAGNOSIS — R262 Difficulty in walking, not elsewhere classified: Secondary | ICD-10-CM
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
## 2020-08-07 ENCOUNTER — Other Ambulatory Visit: Payer: Self-pay

## 2020-08-07 ENCOUNTER — Ambulatory Visit: Payer: Medicare Other | Admitting: Physical Therapy

## 2020-08-07 DIAGNOSIS — M79605 Pain in left leg: Secondary | ICD-10-CM | POA: Diagnosis not present

## 2020-08-07 DIAGNOSIS — M6281 Muscle weakness (generalized): Secondary | ICD-10-CM

## 2020-08-07 DIAGNOSIS — R262 Difficulty in walking, not elsewhere classified: Secondary | ICD-10-CM

## 2020-08-07 DIAGNOSIS — R296 Repeated falls: Secondary | ICD-10-CM
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
## 2020-08-12 ENCOUNTER — Encounter: Payer: Self-pay | Admitting: Physical Therapy

## 2020-08-12 ENCOUNTER — Other Ambulatory Visit: Payer: Self-pay

## 2020-08-12 ENCOUNTER — Ambulatory Visit: Payer: Medicare Other | Attending: Student | Admitting: Physical Therapy

## 2020-08-12 VITALS — BP 160/92 | HR 88

## 2020-08-12 DIAGNOSIS — R262 Difficulty in walking, not elsewhere classified: Secondary | ICD-10-CM | POA: Diagnosis present

## 2020-08-12 DIAGNOSIS — M6281 Muscle weakness (generalized): Secondary | ICD-10-CM | POA: Insufficient documentation

## 2020-08-12 DIAGNOSIS — R296 Repeated falls: Secondary | ICD-10-CM | POA: Insufficient documentation

## 2020-08-12 DIAGNOSIS — M79605 Pain in left leg: Secondary | ICD-10-CM | POA: Diagnosis present
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
## 2020-08-14 ENCOUNTER — Other Ambulatory Visit: Payer: Self-pay

## 2020-08-14 ENCOUNTER — Ambulatory Visit: Payer: Medicare Other | Admitting: Physical Therapy

## 2020-08-14 DIAGNOSIS — M79605 Pain in left leg: Secondary | ICD-10-CM | POA: Diagnosis not present

## 2020-08-14 DIAGNOSIS — R262 Difficulty in walking, not elsewhere classified: Secondary | ICD-10-CM

## 2020-08-14 DIAGNOSIS — R296 Repeated falls: Secondary | ICD-10-CM

## 2020-08-14 DIAGNOSIS — M6281 Muscle weakness (generalized): Secondary | ICD-10-CM
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
## 2020-08-19 ENCOUNTER — Other Ambulatory Visit: Payer: Self-pay

## 2020-08-19 ENCOUNTER — Ambulatory Visit: Payer: Medicare Other | Admitting: Physical Therapy

## 2020-08-19 ENCOUNTER — Encounter: Payer: Self-pay | Admitting: Physical Therapy

## 2020-08-19 VITALS — BP 180/102 | HR 103

## 2020-08-19 DIAGNOSIS — M79605 Pain in left leg: Secondary | ICD-10-CM

## 2020-08-19 DIAGNOSIS — R296 Repeated falls: Secondary | ICD-10-CM

## 2020-08-19 DIAGNOSIS — M6281 Muscle weakness (generalized): Secondary | ICD-10-CM

## 2020-08-19 DIAGNOSIS — R262 Difficulty in walking, not elsewhere classified: Secondary | ICD-10-CM
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
## 2020-08-22 ENCOUNTER — Ambulatory Visit: Payer: Medicare Other | Admitting: Physical Therapy

## 2020-08-22 ENCOUNTER — Encounter: Payer: Self-pay | Admitting: Physical Therapy

## 2020-08-22 ENCOUNTER — Other Ambulatory Visit: Payer: Self-pay

## 2020-08-22 VITALS — BP 140/84 | HR 93

## 2020-08-22 DIAGNOSIS — M6281 Muscle weakness (generalized): Secondary | ICD-10-CM

## 2020-08-22 DIAGNOSIS — R296 Repeated falls: Secondary | ICD-10-CM

## 2020-08-22 DIAGNOSIS — M79605 Pain in left leg: Secondary | ICD-10-CM | POA: Diagnosis not present

## 2020-08-22 DIAGNOSIS — R262 Difficulty in walking, not elsewhere classified: Secondary | ICD-10-CM
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
## 2020-08-26 ENCOUNTER — Ambulatory Visit: Payer: Medicare Other | Admitting: Physical Therapy

## 2020-08-27 DIAGNOSIS — I739 Peripheral vascular disease, unspecified: Secondary | ICD-10-CM | POA: Insufficient documentation

## 2020-08-28 ENCOUNTER — Other Ambulatory Visit: Payer: Self-pay

## 2020-08-28 ENCOUNTER — Ambulatory Visit: Payer: Medicare Other | Admitting: Physical Therapy

## 2020-08-29 ENCOUNTER — Ambulatory Visit: Payer: Medicare Other | Admitting: Physical Therapy

## 2020-08-29 ENCOUNTER — Encounter: Payer: Self-pay | Admitting: Physical Therapy

## 2020-08-29 NOTE — Therapy (Signed)
[facility] [address] [place], [address] Phone: [phone]   Fax:  [phone]  Patient Details  Name: Shawn Rivas MRN: [id number] Date of Birth: 03-27-1966 Referring Provider:  No ref. provider found  Encounter Date: 08/29/2020  Patient arrived to clinic to inform PT that he would not be staying for today's physical therapy appointment due to high level of pain in bilateral quads and left hip. Patient also missed last session because prior to his session he fell in the clinic bathroom while attempting to urinate standing up while his crutches were leaned up against the wall. He reported he "face planted" into the wall and urinated all over himself and the bathroom. He then slipped again and fell backwards into the sink when his crutches slipped on the wet floor. He did not fall to the floor at any time and was able to regain his balance and ambulate back out to his car. He asked the PT to walk with him out to the car and explained what happened at that time. The fall was not witnessed and he appeared uninjured and functional. He reported he didn't think he injured anything. Today (2 days later), patient reported he did not think any of the pain he is having is related to the fall. He attributes part of his fall to being much more tired than usual due to two doctors appointment in the previous days that required him to walk further than he has since he fractured his leg. He guessed at least 200 yards. Today he states he needs to go home and rest because the pain is so hight and he is unable to drive if he takes enough pain medication to help. Discussed fall prevention strategies including use of a RW instead of B axial crutches, not attempting to stand up while urinating until he is more stable on his feet, and how to call in to ask for assistance to ambulate into the office. Also discussed safety at home and while getting  to and from other medical appointments. Patient agreed to bring his walkers to next session to adjust to proper height and to utilize more safety precautions to prevent falls. Confirmed patients upcoming appointment next week. Patient did not stay for a visit and was provided with advice and education only.   Luretha Murphy. [name], PT, DPT 08/29/20, 9:32 AM  [facility] [address] [place], [address] Phone: [phone]   Fax:  [phone]

## 2020-09-02 ENCOUNTER — Other Ambulatory Visit: Payer: Self-pay

## 2020-09-02 ENCOUNTER — Encounter: Payer: Self-pay | Admitting: Physical Therapy

## 2020-09-02 ENCOUNTER — Ambulatory Visit: Payer: Medicare Other | Admitting: Physical Therapy

## 2020-09-02 VITALS — BP 180/90 | HR 90

## 2020-09-02 DIAGNOSIS — M79605 Pain in left leg: Secondary | ICD-10-CM | POA: Diagnosis not present

## 2020-09-02 DIAGNOSIS — R296 Repeated falls: Secondary | ICD-10-CM

## 2020-09-02 DIAGNOSIS — M6281 Muscle weakness (generalized): Secondary | ICD-10-CM

## 2020-09-02 DIAGNOSIS — R262 Difficulty in walking, not elsewhere classified: Secondary | ICD-10-CM
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
## 2020-09-04 ENCOUNTER — Other Ambulatory Visit: Payer: Self-pay

## 2020-09-04 ENCOUNTER — Encounter: Payer: Self-pay | Admitting: Physical Therapy

## 2020-09-04 ENCOUNTER — Ambulatory Visit: Payer: Medicare Other | Admitting: Physical Therapy

## 2020-09-04 VITALS — BP 166/90 | HR 99

## 2020-09-04 DIAGNOSIS — M79605 Pain in left leg: Secondary | ICD-10-CM | POA: Diagnosis not present

## 2020-09-04 DIAGNOSIS — R262 Difficulty in walking, not elsewhere classified: Secondary | ICD-10-CM

## 2020-09-04 DIAGNOSIS — R296 Repeated falls: Secondary | ICD-10-CM

## 2020-09-04 DIAGNOSIS — M6281 Muscle weakness (generalized): Secondary | ICD-10-CM
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
## 2020-09-09 ENCOUNTER — Other Ambulatory Visit: Payer: Self-pay

## 2020-09-09 ENCOUNTER — Ambulatory Visit: Payer: Medicare Other | Admitting: Physical Therapy

## 2020-09-09 ENCOUNTER — Encounter: Payer: Self-pay | Admitting: Physical Therapy

## 2020-09-09 VITALS — BP 175/98 | HR 88

## 2020-09-09 DIAGNOSIS — M79605 Pain in left leg: Secondary | ICD-10-CM | POA: Diagnosis not present

## 2020-09-09 DIAGNOSIS — M6281 Muscle weakness (generalized): Secondary | ICD-10-CM

## 2020-09-09 DIAGNOSIS — R296 Repeated falls: Secondary | ICD-10-CM

## 2020-09-09 DIAGNOSIS — R262 Difficulty in walking, not elsewhere classified: Secondary | ICD-10-CM

## 2020-09-09 NOTE — Progress Notes (Signed)
L LE wound 09/09/2020  Shawn Rivas. [name], PT, DPT 09/09/20, 9:55 AM
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
## 2020-09-11 ENCOUNTER — Encounter: Payer: Self-pay | Admitting: Physical Therapy

## 2020-09-11 ENCOUNTER — Other Ambulatory Visit: Payer: Self-pay

## 2020-09-11 ENCOUNTER — Ambulatory Visit: Payer: Medicare Other | Attending: Student | Admitting: Physical Therapy

## 2020-09-11 VITALS — BP 113/72 | HR 90

## 2020-09-11 DIAGNOSIS — R262 Difficulty in walking, not elsewhere classified: Secondary | ICD-10-CM | POA: Insufficient documentation

## 2020-09-11 DIAGNOSIS — M6281 Muscle weakness (generalized): Secondary | ICD-10-CM

## 2020-09-11 DIAGNOSIS — R296 Repeated falls: Secondary | ICD-10-CM | POA: Diagnosis present

## 2020-09-11 DIAGNOSIS — M79605 Pain in left leg: Secondary | ICD-10-CM | POA: Diagnosis not present
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
## 2020-09-12 ENCOUNTER — Ambulatory Visit: Payer: Medicare Other | Admitting: Physical Therapy

## 2020-09-16 ENCOUNTER — Encounter: Payer: Self-pay | Admitting: Physical Therapy

## 2020-09-16 ENCOUNTER — Other Ambulatory Visit: Payer: Self-pay

## 2020-09-16 ENCOUNTER — Ambulatory Visit: Payer: Medicare Other | Admitting: Physical Therapy

## 2020-09-16 VITALS — BP 159/89 | HR 82

## 2020-09-16 DIAGNOSIS — M79605 Pain in left leg: Secondary | ICD-10-CM

## 2020-09-16 DIAGNOSIS — M6281 Muscle weakness (generalized): Secondary | ICD-10-CM

## 2020-09-16 DIAGNOSIS — R296 Repeated falls: Secondary | ICD-10-CM

## 2020-09-16 DIAGNOSIS — R262 Difficulty in walking, not elsewhere classified: Secondary | ICD-10-CM
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
## 2020-09-18 ENCOUNTER — Ambulatory Visit: Payer: Medicare Other | Admitting: Physical Therapy

## 2020-09-18 ENCOUNTER — Other Ambulatory Visit: Payer: Self-pay

## 2020-09-18 ENCOUNTER — Encounter: Payer: Self-pay | Admitting: Physical Therapy

## 2020-09-18 VITALS — BP 150/94 | HR 85

## 2020-09-18 DIAGNOSIS — R262 Difficulty in walking, not elsewhere classified: Secondary | ICD-10-CM

## 2020-09-18 DIAGNOSIS — M79605 Pain in left leg: Secondary | ICD-10-CM

## 2020-09-18 DIAGNOSIS — R296 Repeated falls: Secondary | ICD-10-CM

## 2020-09-18 DIAGNOSIS — M6281 Muscle weakness (generalized): Secondary | ICD-10-CM
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
## 2020-09-23 ENCOUNTER — Encounter: Payer: Self-pay | Admitting: Physical Therapy

## 2020-09-23 ENCOUNTER — Ambulatory Visit: Payer: Medicare Other | Admitting: Physical Therapy

## 2020-09-23 ENCOUNTER — Other Ambulatory Visit: Payer: Self-pay

## 2020-09-23 VITALS — BP 168/100 | HR 92

## 2020-09-23 DIAGNOSIS — M79605 Pain in left leg: Secondary | ICD-10-CM | POA: Diagnosis not present

## 2020-09-23 DIAGNOSIS — M6281 Muscle weakness (generalized): Secondary | ICD-10-CM

## 2020-09-23 DIAGNOSIS — R262 Difficulty in walking, not elsewhere classified: Secondary | ICD-10-CM

## 2020-09-23 DIAGNOSIS — R296 Repeated falls: Secondary | ICD-10-CM
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
## 2020-09-23 NOTE — Progress Notes (Signed)
Photos of L LE 10/11/20  Shawn Rivas. [name], PT, DPT 09/23/20, 11:10 AM

## 2020-09-25 ENCOUNTER — Encounter: Payer: Medicare Other | Admitting: Physical Therapy

## 2020-09-26 ENCOUNTER — Encounter: Payer: Self-pay | Admitting: Physical Therapy

## 2020-09-26 ENCOUNTER — Ambulatory Visit: Payer: Medicare Other | Admitting: Physical Therapy

## 2020-09-26 ENCOUNTER — Other Ambulatory Visit: Payer: Self-pay

## 2020-09-26 VITALS — BP 178/98 | HR 94

## 2020-09-26 DIAGNOSIS — M79605 Pain in left leg: Secondary | ICD-10-CM

## 2020-09-26 DIAGNOSIS — M6281 Muscle weakness (generalized): Secondary | ICD-10-CM

## 2020-09-26 DIAGNOSIS — R296 Repeated falls: Secondary | ICD-10-CM

## 2020-09-26 DIAGNOSIS — R262 Difficulty in walking, not elsewhere classified: Secondary | ICD-10-CM
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
## 2020-09-30 ENCOUNTER — Encounter (INDEPENDENT_AMBULATORY_CARE_PROVIDER_SITE_OTHER): Payer: Self-pay | Admitting: Vascular Surgery

## 2020-09-30 ENCOUNTER — Ambulatory Visit: Payer: Medicare Other | Admitting: Physical Therapy

## 2020-09-30 ENCOUNTER — Encounter: Payer: Self-pay | Admitting: Physical Therapy

## 2020-09-30 ENCOUNTER — Ambulatory Visit (INDEPENDENT_AMBULATORY_CARE_PROVIDER_SITE_OTHER): Payer: Medicare Other | Admitting: Vascular Surgery

## 2020-09-30 ENCOUNTER — Other Ambulatory Visit: Payer: Self-pay

## 2020-09-30 VITALS — BP 159/94 | HR 90

## 2020-09-30 VITALS — BP 188/93 | HR 65 | Resp 16 | Ht 70.5 in | Wt 268.6 lb

## 2020-09-30 DIAGNOSIS — E782 Mixed hyperlipidemia: Secondary | ICD-10-CM

## 2020-09-30 DIAGNOSIS — R296 Repeated falls: Secondary | ICD-10-CM

## 2020-09-30 DIAGNOSIS — E1151 Type 2 diabetes mellitus with diabetic peripheral angiopathy without gangrene: Secondary | ICD-10-CM | POA: Diagnosis not present

## 2020-09-30 DIAGNOSIS — E785 Hyperlipidemia, unspecified: Secondary | ICD-10-CM | POA: Insufficient documentation

## 2020-09-30 DIAGNOSIS — I739 Peripheral vascular disease, unspecified: Secondary | ICD-10-CM

## 2020-09-30 DIAGNOSIS — I10 Essential (primary) hypertension: Secondary | ICD-10-CM

## 2020-09-30 DIAGNOSIS — Z794 Long term (current) use of insulin: Secondary | ICD-10-CM

## 2020-09-30 DIAGNOSIS — M6281 Muscle weakness (generalized): Secondary | ICD-10-CM

## 2020-09-30 DIAGNOSIS — R262 Difficulty in walking, not elsewhere classified: Secondary | ICD-10-CM

## 2020-09-30 DIAGNOSIS — I872 Venous insufficiency (chronic) (peripheral): Secondary | ICD-10-CM | POA: Diagnosis not present

## 2020-09-30 DIAGNOSIS — E119 Type 2 diabetes mellitus without complications: Secondary | ICD-10-CM | POA: Insufficient documentation

## 2020-09-30 DIAGNOSIS — M79605 Pain in left leg: Secondary | ICD-10-CM | POA: Diagnosis not present
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
## 2020-10-02 ENCOUNTER — Ambulatory Visit: Payer: Medicare Other | Admitting: Physical Therapy

## 2020-10-02 ENCOUNTER — Other Ambulatory Visit: Payer: Self-pay

## 2020-10-02 ENCOUNTER — Encounter: Payer: Self-pay | Admitting: Physical Therapy

## 2020-10-02 VITALS — BP 160/92 | HR 93

## 2020-10-02 DIAGNOSIS — M79605 Pain in left leg: Secondary | ICD-10-CM | POA: Diagnosis not present

## 2020-10-02 DIAGNOSIS — M6281 Muscle weakness (generalized): Secondary | ICD-10-CM

## 2020-10-02 DIAGNOSIS — R296 Repeated falls: Secondary | ICD-10-CM

## 2020-10-02 DIAGNOSIS — R262 Difficulty in walking, not elsewhere classified: Secondary | ICD-10-CM
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
## 2020-10-03 ENCOUNTER — Encounter: Payer: Medicare Other | Admitting: Physical Therapy

## 2020-10-14 ENCOUNTER — Ambulatory Visit: Payer: Medicare Other | Admitting: Physical Therapy

## 2020-10-16 ENCOUNTER — Other Ambulatory Visit: Payer: Self-pay

## 2020-10-16 ENCOUNTER — Encounter: Payer: Self-pay | Admitting: Physical Therapy

## 2020-10-16 ENCOUNTER — Ambulatory Visit: Payer: Medicare Other | Attending: Student | Admitting: Physical Therapy

## 2020-10-16 ENCOUNTER — Encounter (INDEPENDENT_AMBULATORY_CARE_PROVIDER_SITE_OTHER): Payer: Self-pay | Admitting: Vascular Surgery

## 2020-10-16 VITALS — BP 148/80 | HR 109

## 2020-10-16 DIAGNOSIS — R262 Difficulty in walking, not elsewhere classified: Secondary | ICD-10-CM | POA: Insufficient documentation

## 2020-10-16 DIAGNOSIS — M6281 Muscle weakness (generalized): Secondary | ICD-10-CM | POA: Diagnosis present

## 2020-10-16 DIAGNOSIS — R296 Repeated falls: Secondary | ICD-10-CM | POA: Diagnosis present

## 2020-10-16 DIAGNOSIS — M79605 Pain in left leg: Secondary | ICD-10-CM
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
## 2020-10-22 ENCOUNTER — Ambulatory Visit: Payer: Medicare Other | Admitting: Physical Therapy

## 2020-10-24 ENCOUNTER — Ambulatory Visit: Payer: Medicare Other | Admitting: Physical Therapy

## 2020-10-24 ENCOUNTER — Ambulatory Visit (INDEPENDENT_AMBULATORY_CARE_PROVIDER_SITE_OTHER): Payer: Medicare Other | Admitting: Vascular Surgery

## 2020-10-24 ENCOUNTER — Encounter (INDEPENDENT_AMBULATORY_CARE_PROVIDER_SITE_OTHER): Payer: Medicare Other

## 2020-10-24 ENCOUNTER — Encounter: Payer: Self-pay | Admitting: Physical Therapy

## 2020-10-24 ENCOUNTER — Other Ambulatory Visit: Payer: Self-pay

## 2020-10-24 VITALS — BP 180/98 | HR 97

## 2020-10-24 DIAGNOSIS — M6281 Muscle weakness (generalized): Secondary | ICD-10-CM

## 2020-10-24 DIAGNOSIS — M79605 Pain in left leg: Secondary | ICD-10-CM

## 2020-10-24 DIAGNOSIS — R296 Repeated falls: Secondary | ICD-10-CM

## 2020-10-24 DIAGNOSIS — R262 Difficulty in walking, not elsewhere classified: Secondary | ICD-10-CM
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
## 2020-10-29 ENCOUNTER — Ambulatory Visit: Payer: Medicare Other | Admitting: Physical Therapy

## 2020-10-31 ENCOUNTER — Encounter: Payer: Medicare Other | Admitting: Physical Therapy

## 2020-10-31 ENCOUNTER — Ambulatory Visit: Payer: Medicare Other | Admitting: Physical Therapy

## 2020-11-05 ENCOUNTER — Other Ambulatory Visit: Payer: Self-pay

## 2020-11-05 ENCOUNTER — Ambulatory Visit: Payer: Medicare Other | Admitting: Physical Therapy

## 2020-11-05 ENCOUNTER — Encounter: Payer: Self-pay | Admitting: Physical Therapy

## 2020-11-05 VITALS — BP 156/86 | HR 107

## 2020-11-05 DIAGNOSIS — M6281 Muscle weakness (generalized): Secondary | ICD-10-CM

## 2020-11-05 DIAGNOSIS — R296 Repeated falls: Secondary | ICD-10-CM

## 2020-11-05 DIAGNOSIS — R262 Difficulty in walking, not elsewhere classified: Secondary | ICD-10-CM

## 2020-11-05 DIAGNOSIS — M79605 Pain in left leg: Secondary | ICD-10-CM
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
## 2020-11-07 ENCOUNTER — Encounter: Payer: Medicare Other | Admitting: Physical Therapy

## 2020-11-12 ENCOUNTER — Encounter: Payer: Self-pay | Admitting: Physical Therapy

## 2020-11-12 ENCOUNTER — Ambulatory Visit: Payer: Medicare Other | Attending: Student | Admitting: Physical Therapy

## 2020-11-12 ENCOUNTER — Other Ambulatory Visit: Payer: Self-pay

## 2020-11-12 VITALS — BP 154/92 | HR 93

## 2020-11-12 DIAGNOSIS — R262 Difficulty in walking, not elsewhere classified: Secondary | ICD-10-CM | POA: Diagnosis present

## 2020-11-12 DIAGNOSIS — R296 Repeated falls: Secondary | ICD-10-CM | POA: Insufficient documentation

## 2020-11-12 DIAGNOSIS — M6281 Muscle weakness (generalized): Secondary | ICD-10-CM | POA: Insufficient documentation

## 2020-11-12 DIAGNOSIS — M79605 Pain in left leg: Secondary | ICD-10-CM
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
## 2020-11-18 ENCOUNTER — Encounter (INDEPENDENT_AMBULATORY_CARE_PROVIDER_SITE_OTHER): Payer: Medicare Other

## 2020-11-18 ENCOUNTER — Ambulatory Visit (INDEPENDENT_AMBULATORY_CARE_PROVIDER_SITE_OTHER): Payer: Medicare Other | Admitting: Vascular Surgery

## 2020-11-18 ENCOUNTER — Encounter (INDEPENDENT_AMBULATORY_CARE_PROVIDER_SITE_OTHER): Payer: Self-pay | Admitting: Vascular Surgery

## 2020-11-18 ENCOUNTER — Other Ambulatory Visit: Payer: Self-pay

## 2020-11-18 VITALS — BP 190/112 | HR 105 | Resp 16 | Wt 266.0 lb

## 2020-11-18 DIAGNOSIS — E119 Type 2 diabetes mellitus without complications: Secondary | ICD-10-CM

## 2020-11-18 DIAGNOSIS — I739 Peripheral vascular disease, unspecified: Secondary | ICD-10-CM | POA: Diagnosis not present

## 2020-11-18 DIAGNOSIS — I2510 Atherosclerotic heart disease of native coronary artery without angina pectoris: Secondary | ICD-10-CM | POA: Diagnosis not present

## 2020-11-18 DIAGNOSIS — I10 Essential (primary) hypertension: Secondary | ICD-10-CM

## 2020-11-18 DIAGNOSIS — Z794 Long term (current) use of insulin: Secondary | ICD-10-CM

## 2020-11-18 DIAGNOSIS — I872 Venous insufficiency (chronic) (peripheral): Secondary | ICD-10-CM

## 2020-11-19 ENCOUNTER — Ambulatory Visit: Payer: Medicare Other | Admitting: Physical Therapy

## 2020-11-19 ENCOUNTER — Encounter: Payer: Self-pay | Admitting: Physical Therapy

## 2020-11-19 VITALS — BP 166/100 | HR 101

## 2020-11-19 DIAGNOSIS — M79605 Pain in left leg: Secondary | ICD-10-CM

## 2020-11-19 DIAGNOSIS — R296 Repeated falls: Secondary | ICD-10-CM

## 2020-11-19 DIAGNOSIS — R262 Difficulty in walking, not elsewhere classified: Secondary | ICD-10-CM

## 2020-11-19 DIAGNOSIS — M6281 Muscle weakness (generalized): Secondary | ICD-10-CM
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
## 2020-11-26 ENCOUNTER — Ambulatory Visit: Payer: Medicare Other | Admitting: Physical Therapy

## 2020-11-26 ENCOUNTER — Encounter: Payer: Self-pay | Admitting: Physical Therapy

## 2020-11-26 ENCOUNTER — Other Ambulatory Visit: Payer: Self-pay

## 2020-11-26 VITALS — BP 140/86 | HR 114

## 2020-11-26 DIAGNOSIS — M79605 Pain in left leg: Secondary | ICD-10-CM | POA: Diagnosis not present

## 2020-11-26 DIAGNOSIS — M6281 Muscle weakness (generalized): Secondary | ICD-10-CM

## 2020-11-26 DIAGNOSIS — R262 Difficulty in walking, not elsewhere classified: Secondary | ICD-10-CM

## 2020-11-26 DIAGNOSIS — R296 Repeated falls: Secondary | ICD-10-CM
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
## 2020-12-03 ENCOUNTER — Encounter: Payer: Medicare Other | Admitting: Physical Therapy

## 2020-12-05 ENCOUNTER — Ambulatory Visit: Payer: Medicare Other | Admitting: Physical Therapy

## 2020-12-05 ENCOUNTER — Encounter: Payer: Self-pay | Admitting: Physical Therapy

## 2020-12-05 ENCOUNTER — Other Ambulatory Visit: Payer: Self-pay

## 2020-12-05 VITALS — BP 146/86 | HR 84

## 2020-12-05 DIAGNOSIS — R262 Difficulty in walking, not elsewhere classified: Secondary | ICD-10-CM

## 2020-12-05 DIAGNOSIS — M79605 Pain in left leg: Secondary | ICD-10-CM | POA: Diagnosis not present

## 2020-12-05 DIAGNOSIS — M6281 Muscle weakness (generalized): Secondary | ICD-10-CM

## 2020-12-05 DIAGNOSIS — R296 Repeated falls: Secondary | ICD-10-CM
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
## 2020-12-10 ENCOUNTER — Ambulatory Visit: Payer: Medicare Other | Attending: Student | Admitting: Physical Therapy

## 2020-12-10 ENCOUNTER — Encounter: Payer: Self-pay | Admitting: Physical Therapy

## 2020-12-10 ENCOUNTER — Other Ambulatory Visit: Payer: Self-pay

## 2020-12-10 VITALS — BP 136/84 | HR 84

## 2020-12-10 DIAGNOSIS — R262 Difficulty in walking, not elsewhere classified: Secondary | ICD-10-CM | POA: Diagnosis present

## 2020-12-10 DIAGNOSIS — R296 Repeated falls: Secondary | ICD-10-CM | POA: Diagnosis present

## 2020-12-10 DIAGNOSIS — M6281 Muscle weakness (generalized): Secondary | ICD-10-CM | POA: Diagnosis present

## 2020-12-10 DIAGNOSIS — M79605 Pain in left leg: Secondary | ICD-10-CM | POA: Diagnosis present
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
## 2020-12-10 NOTE — Progress Notes (Signed)
Pictures of Patients L plantar surface of foot for documentation reasons.   Shawn Rivas. [name], PT, DPT 12/10/20, 10:25 AM

## 2020-12-18 ENCOUNTER — Encounter: Payer: Self-pay | Admitting: Physical Therapy

## 2020-12-18 ENCOUNTER — Other Ambulatory Visit: Payer: Self-pay

## 2020-12-18 ENCOUNTER — Ambulatory Visit: Payer: Medicare Other | Admitting: Physical Therapy

## 2020-12-18 VITALS — BP 130/86 | HR 113

## 2020-12-18 DIAGNOSIS — M6281 Muscle weakness (generalized): Secondary | ICD-10-CM

## 2020-12-18 DIAGNOSIS — R262 Difficulty in walking, not elsewhere classified: Secondary | ICD-10-CM

## 2020-12-18 DIAGNOSIS — M79605 Pain in left leg: Secondary | ICD-10-CM

## 2020-12-18 DIAGNOSIS — R296 Repeated falls: Secondary | ICD-10-CM
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
## 2020-12-24 ENCOUNTER — Other Ambulatory Visit: Payer: Self-pay

## 2020-12-24 ENCOUNTER — Ambulatory Visit: Payer: Medicare Other | Admitting: Physical Therapy

## 2020-12-24 ENCOUNTER — Encounter: Payer: Self-pay | Admitting: Physical Therapy

## 2020-12-24 VITALS — BP 168/96 | HR 93

## 2020-12-24 DIAGNOSIS — R262 Difficulty in walking, not elsewhere classified: Secondary | ICD-10-CM

## 2020-12-24 DIAGNOSIS — M79605 Pain in left leg: Secondary | ICD-10-CM

## 2020-12-24 DIAGNOSIS — M6281 Muscle weakness (generalized): Secondary | ICD-10-CM

## 2020-12-24 DIAGNOSIS — R296 Repeated falls: Secondary | ICD-10-CM
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
## 2020-12-31 ENCOUNTER — Encounter: Payer: Self-pay | Admitting: Physical Therapy

## 2020-12-31 ENCOUNTER — Other Ambulatory Visit: Payer: Self-pay

## 2020-12-31 ENCOUNTER — Ambulatory Visit: Payer: Medicare Other | Admitting: Physical Therapy

## 2020-12-31 VITALS — BP 150/90 | HR 85

## 2020-12-31 DIAGNOSIS — R296 Repeated falls: Secondary | ICD-10-CM

## 2020-12-31 DIAGNOSIS — M79605 Pain in left leg: Secondary | ICD-10-CM

## 2020-12-31 DIAGNOSIS — R262 Difficulty in walking, not elsewhere classified: Secondary | ICD-10-CM

## 2020-12-31 DIAGNOSIS — M6281 Muscle weakness (generalized): Secondary | ICD-10-CM
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
## 2021-01-07 ENCOUNTER — Ambulatory Visit: Payer: Medicare Other | Admitting: Physical Therapy

## 2021-01-07 ENCOUNTER — Other Ambulatory Visit: Payer: Self-pay

## 2021-01-07 ENCOUNTER — Encounter: Payer: Self-pay | Admitting: Physical Therapy

## 2021-01-07 VITALS — BP 152/94 | HR 92

## 2021-01-07 DIAGNOSIS — M79605 Pain in left leg: Secondary | ICD-10-CM

## 2021-01-07 DIAGNOSIS — R262 Difficulty in walking, not elsewhere classified: Secondary | ICD-10-CM

## 2021-01-07 DIAGNOSIS — R296 Repeated falls: Secondary | ICD-10-CM

## 2021-01-07 DIAGNOSIS — M6281 Muscle weakness (generalized): Secondary | ICD-10-CM
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
## 2021-01-14 ENCOUNTER — Other Ambulatory Visit: Payer: Self-pay

## 2021-01-14 ENCOUNTER — Encounter: Payer: Self-pay | Admitting: Physical Therapy

## 2021-01-14 ENCOUNTER — Ambulatory Visit: Payer: Medicare Other | Attending: Student | Admitting: Physical Therapy

## 2021-01-14 VITALS — BP 180/96 | HR 99

## 2021-01-14 DIAGNOSIS — M79605 Pain in left leg: Secondary | ICD-10-CM | POA: Diagnosis present

## 2021-01-14 DIAGNOSIS — M6281 Muscle weakness (generalized): Secondary | ICD-10-CM | POA: Diagnosis present

## 2021-01-14 DIAGNOSIS — R262 Difficulty in walking, not elsewhere classified: Secondary | ICD-10-CM | POA: Diagnosis present

## 2021-01-14 DIAGNOSIS — R296 Repeated falls: Secondary | ICD-10-CM
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
## 2021-01-21 ENCOUNTER — Ambulatory Visit: Payer: Medicare Other | Admitting: Physical Therapy

## 2021-01-21 ENCOUNTER — Encounter: Payer: Self-pay | Admitting: Physical Therapy

## 2021-01-21 ENCOUNTER — Other Ambulatory Visit: Payer: Self-pay

## 2021-01-21 ENCOUNTER — Telehealth: Payer: Self-pay | Admitting: Physical Therapy

## 2021-01-21 VITALS — BP 156/90 | HR 95

## 2021-01-21 DIAGNOSIS — M79605 Pain in left leg: Secondary | ICD-10-CM

## 2021-01-21 DIAGNOSIS — R262 Difficulty in walking, not elsewhere classified: Secondary | ICD-10-CM

## 2021-01-21 DIAGNOSIS — M6281 Muscle weakness (generalized): Secondary | ICD-10-CM

## 2021-01-21 DIAGNOSIS — R296 Repeated falls: Secondary | ICD-10-CM

## 2021-01-21 NOTE — Telephone Encounter (Signed)
Patient called back after his PT appointment to say his continuous glucose meter just went off to alert him his blood glucose was below 70 mg/dL. Patient states he does not know what too low is and is not used to dealing with blood glucose that is too low. Wants to alert PT to the fact that it got low (~67 mg/dL)  and also asking what he should do. PT advised to eat at least 15 grams of carbohydrate as soon as possible and that simple carbs (sugar) was desirable. 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 States he has been taking his new medication for blood glucose control on Wednesdays and wonders if this needs to be adjusted. PT informed patient on blood glucose lowering effects of exercise and need to balance carb intake/meds with exercise. Informed him that managing low and high is needed to help keep blood glucose in healthy range and that he is used to it being way too high, so he has not been used to dealing with low range but it is common for diabetics to work to manage both high/low. Patient does say he is feeling a "little fuzzy" cognitively but this is common for him after PT. Agreed to get carbohydrate and eat it. PT advised patient that he should speak with medical team for more specific advice about blood glucose management to answer some of his other questions about medications that are outside scope of PT practice.   Luretha Murphy. [name], PT, DPT 01/21/21, 10:29 AM
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
## 2021-01-28 ENCOUNTER — Encounter: Payer: Self-pay | Admitting: Physical Therapy

## 2021-01-28 ENCOUNTER — Ambulatory Visit: Payer: Medicare Other | Admitting: Physical Therapy

## 2021-01-28 ENCOUNTER — Other Ambulatory Visit: Payer: Self-pay

## 2021-01-28 VITALS — BP 168/98 | HR 88

## 2021-01-28 DIAGNOSIS — R296 Repeated falls: Secondary | ICD-10-CM

## 2021-01-28 DIAGNOSIS — R262 Difficulty in walking, not elsewhere classified: Secondary | ICD-10-CM

## 2021-01-28 DIAGNOSIS — M79605 Pain in left leg: Secondary | ICD-10-CM | POA: Diagnosis not present

## 2021-01-28 DIAGNOSIS — M6281 Muscle weakness (generalized): Secondary | ICD-10-CM
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
## 2021-02-04 ENCOUNTER — Ambulatory Visit: Payer: Medicare Other | Admitting: Physical Therapy

## 2021-02-05 ENCOUNTER — Ambulatory Visit: Payer: Medicare Other | Admitting: Physical Therapy

## 2021-02-06 ENCOUNTER — Other Ambulatory Visit: Payer: Self-pay

## 2021-02-06 ENCOUNTER — Ambulatory Visit: Payer: Medicare Other | Admitting: Physical Therapy

## 2021-02-06 ENCOUNTER — Encounter: Payer: Self-pay | Admitting: Physical Therapy

## 2021-02-06 VITALS — BP 170/100 | HR 95

## 2021-02-06 DIAGNOSIS — R296 Repeated falls: Secondary | ICD-10-CM

## 2021-02-06 DIAGNOSIS — M6281 Muscle weakness (generalized): Secondary | ICD-10-CM

## 2021-02-06 DIAGNOSIS — M79605 Pain in left leg: Secondary | ICD-10-CM

## 2021-02-06 DIAGNOSIS — R262 Difficulty in walking, not elsewhere classified: Secondary | ICD-10-CM
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
## 2021-02-10 ENCOUNTER — Encounter: Payer: Self-pay | Admitting: Physical Therapy

## 2021-02-10 ENCOUNTER — Ambulatory Visit: Payer: Medicare Other | Attending: Student | Admitting: Physical Therapy

## 2021-02-10 ENCOUNTER — Other Ambulatory Visit: Payer: Self-pay

## 2021-02-10 VITALS — BP 180/96 | HR 93

## 2021-02-10 DIAGNOSIS — R296 Repeated falls: Secondary | ICD-10-CM | POA: Diagnosis present

## 2021-02-10 DIAGNOSIS — M6281 Muscle weakness (generalized): Secondary | ICD-10-CM | POA: Diagnosis present

## 2021-02-10 DIAGNOSIS — M79605 Pain in left leg: Secondary | ICD-10-CM | POA: Insufficient documentation

## 2021-02-10 DIAGNOSIS — R262 Difficulty in walking, not elsewhere classified: Secondary | ICD-10-CM
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
## 2021-02-11 ENCOUNTER — Encounter: Payer: Medicare Other | Admitting: Physical Therapy

## 2021-02-17 ENCOUNTER — Encounter: Payer: Self-pay | Admitting: Physical Therapy

## 2021-02-17 ENCOUNTER — Other Ambulatory Visit: Payer: Self-pay

## 2021-02-17 ENCOUNTER — Ambulatory Visit: Payer: Medicare Other | Admitting: Physical Therapy

## 2021-02-17 VITALS — BP 170/108 | HR 92

## 2021-02-17 DIAGNOSIS — M6281 Muscle weakness (generalized): Secondary | ICD-10-CM | POA: Diagnosis not present

## 2021-02-17 DIAGNOSIS — M79605 Pain in left leg: Secondary | ICD-10-CM

## 2021-02-17 DIAGNOSIS — R262 Difficulty in walking, not elsewhere classified: Secondary | ICD-10-CM

## 2021-02-17 DIAGNOSIS — R296 Repeated falls: Secondary | ICD-10-CM
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
## 2021-02-18 ENCOUNTER — Encounter: Payer: Medicare Other | Admitting: Physical Therapy

## 2021-02-25 ENCOUNTER — Other Ambulatory Visit: Payer: Self-pay

## 2021-02-25 ENCOUNTER — Encounter: Payer: Self-pay | Admitting: Physical Therapy

## 2021-02-25 ENCOUNTER — Ambulatory Visit: Payer: Medicare Other | Admitting: Physical Therapy

## 2021-02-25 VITALS — BP 178/100 | HR 92

## 2021-02-25 DIAGNOSIS — M6281 Muscle weakness (generalized): Secondary | ICD-10-CM

## 2021-02-25 DIAGNOSIS — R262 Difficulty in walking, not elsewhere classified: Secondary | ICD-10-CM

## 2021-02-25 DIAGNOSIS — R296 Repeated falls: Secondary | ICD-10-CM
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
## 2021-03-04 ENCOUNTER — Encounter: Payer: Self-pay | Admitting: Physical Therapy

## 2021-03-04 ENCOUNTER — Ambulatory Visit: Payer: Medicare Other | Admitting: Physical Therapy

## 2021-03-04 ENCOUNTER — Other Ambulatory Visit: Payer: Self-pay

## 2021-03-04 VITALS — BP 124/84 | HR 101

## 2021-03-04 DIAGNOSIS — M6281 Muscle weakness (generalized): Secondary | ICD-10-CM | POA: Diagnosis not present

## 2021-03-04 DIAGNOSIS — R296 Repeated falls: Secondary | ICD-10-CM

## 2021-03-04 DIAGNOSIS — M79605 Pain in left leg: Secondary | ICD-10-CM

## 2021-03-04 DIAGNOSIS — R262 Difficulty in walking, not elsewhere classified: Secondary | ICD-10-CM
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
## 2021-03-11 ENCOUNTER — Ambulatory Visit: Payer: Medicare Other | Admitting: Physical Therapy

## 2021-03-11 ENCOUNTER — Other Ambulatory Visit: Payer: Self-pay

## 2021-03-11 ENCOUNTER — Encounter: Payer: Self-pay | Admitting: Physical Therapy

## 2021-03-11 VITALS — BP 170/90 | HR 110

## 2021-03-11 DIAGNOSIS — M6281 Muscle weakness (generalized): Secondary | ICD-10-CM | POA: Diagnosis not present

## 2021-03-11 DIAGNOSIS — M79605 Pain in left leg: Secondary | ICD-10-CM

## 2021-03-11 DIAGNOSIS — R262 Difficulty in walking, not elsewhere classified: Secondary | ICD-10-CM

## 2021-03-11 DIAGNOSIS — R296 Repeated falls: Secondary | ICD-10-CM
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
## 2021-03-18 ENCOUNTER — Encounter: Payer: Self-pay | Admitting: Physical Therapy

## 2021-03-18 ENCOUNTER — Other Ambulatory Visit: Payer: Self-pay

## 2021-03-18 ENCOUNTER — Ambulatory Visit: Payer: Medicare Other | Attending: Student | Admitting: Physical Therapy

## 2021-03-18 VITALS — BP 170/96 | HR 99

## 2021-03-18 DIAGNOSIS — R262 Difficulty in walking, not elsewhere classified: Secondary | ICD-10-CM | POA: Diagnosis present

## 2021-03-18 DIAGNOSIS — M6281 Muscle weakness (generalized): Secondary | ICD-10-CM | POA: Diagnosis present

## 2021-03-18 DIAGNOSIS — R296 Repeated falls: Secondary | ICD-10-CM | POA: Diagnosis present

## 2021-03-18 DIAGNOSIS — M79605 Pain in left leg: Secondary | ICD-10-CM | POA: Diagnosis present
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
## 2021-03-24 ENCOUNTER — Ambulatory Visit (HOSPITAL_BASED_OUTPATIENT_CLINIC_OR_DEPARTMENT_OTHER): Payer: Medicare Other | Admitting: Physical Therapy

## 2021-03-25 ENCOUNTER — Ambulatory Visit: Payer: Medicare Other | Admitting: Physical Therapy

## 2021-03-25 ENCOUNTER — Other Ambulatory Visit: Payer: Self-pay

## 2021-03-25 ENCOUNTER — Encounter: Payer: Self-pay | Admitting: Physical Therapy

## 2021-03-25 VITALS — BP 163/98 | HR 102

## 2021-03-25 DIAGNOSIS — M79605 Pain in left leg: Secondary | ICD-10-CM | POA: Diagnosis not present

## 2021-03-25 DIAGNOSIS — M6281 Muscle weakness (generalized): Secondary | ICD-10-CM

## 2021-03-25 DIAGNOSIS — R296 Repeated falls: Secondary | ICD-10-CM

## 2021-03-25 DIAGNOSIS — R262 Difficulty in walking, not elsewhere classified: Secondary | ICD-10-CM
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
## 2021-04-01 ENCOUNTER — Encounter: Payer: Medicare Other | Admitting: Physical Therapy

## 2021-04-02 ENCOUNTER — Ambulatory Visit (HOSPITAL_BASED_OUTPATIENT_CLINIC_OR_DEPARTMENT_OTHER): Payer: Medicare Other | Attending: Family Medicine | Admitting: Physical Therapy

## 2021-04-02 ENCOUNTER — Other Ambulatory Visit: Payer: Self-pay

## 2021-04-02 ENCOUNTER — Encounter (HOSPITAL_BASED_OUTPATIENT_CLINIC_OR_DEPARTMENT_OTHER): Payer: Self-pay | Admitting: Physical Therapy

## 2021-04-02 DIAGNOSIS — R296 Repeated falls: Secondary | ICD-10-CM

## 2021-04-02 DIAGNOSIS — M6281 Muscle weakness (generalized): Secondary | ICD-10-CM

## 2021-04-02 DIAGNOSIS — R609 Edema, unspecified: Secondary | ICD-10-CM | POA: Diagnosis present

## 2021-04-02 DIAGNOSIS — R2689 Other abnormalities of gait and mobility: Secondary | ICD-10-CM | POA: Diagnosis present

## 2021-04-02 DIAGNOSIS — R209 Unspecified disturbances of skin sensation: Secondary | ICD-10-CM

## 2021-04-02 DIAGNOSIS — M79605 Pain in left leg: Secondary | ICD-10-CM | POA: Diagnosis present

## 2021-04-02 DIAGNOSIS — R262 Difficulty in walking, not elsewhere classified: Secondary | ICD-10-CM | POA: Diagnosis present
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
## 2021-04-07 ENCOUNTER — Telehealth: Payer: Self-pay | Admitting: Physical Therapy

## 2021-04-07 ENCOUNTER — Ambulatory Visit (HOSPITAL_BASED_OUTPATIENT_CLINIC_OR_DEPARTMENT_OTHER): Payer: Medicare Other | Admitting: Physical Therapy
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
## 2021-04-08 ENCOUNTER — Ambulatory Visit: Payer: Medicare Other | Admitting: Physical Therapy

## 2021-04-10 ENCOUNTER — Ambulatory Visit: Payer: Medicare Other | Admitting: Physical Therapy

## 2021-04-15 ENCOUNTER — Ambulatory Visit (HOSPITAL_BASED_OUTPATIENT_CLINIC_OR_DEPARTMENT_OTHER): Payer: Medicare Other | Admitting: Physical Therapy

## 2021-04-17 ENCOUNTER — Encounter: Payer: Self-pay | Admitting: Physical Therapy

## 2021-04-17 ENCOUNTER — Ambulatory Visit: Payer: Medicare Other | Attending: Student | Admitting: Physical Therapy

## 2021-04-17 ENCOUNTER — Ambulatory Visit (HOSPITAL_BASED_OUTPATIENT_CLINIC_OR_DEPARTMENT_OTHER): Payer: Medicare Other | Admitting: Physical Therapy

## 2021-04-17 VITALS — BP 194/96 | HR 105

## 2021-04-17 DIAGNOSIS — R262 Difficulty in walking, not elsewhere classified: Secondary | ICD-10-CM

## 2021-04-17 DIAGNOSIS — R296 Repeated falls: Secondary | ICD-10-CM | POA: Insufficient documentation

## 2021-04-17 DIAGNOSIS — M79605 Pain in left leg: Secondary | ICD-10-CM | POA: Insufficient documentation

## 2021-04-17 DIAGNOSIS — M6281 Muscle weakness (generalized): Secondary | ICD-10-CM | POA: Diagnosis present
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
## 2021-04-17 NOTE — Progress Notes (Signed)
Photographs of L LE taken 04/17/2021 to document observation of integument of extremity.   Shawn Rivas. [name], PT, DPT 04/17/21, 8:49 PM

## 2021-04-22 ENCOUNTER — Ambulatory Visit (HOSPITAL_BASED_OUTPATIENT_CLINIC_OR_DEPARTMENT_OTHER): Payer: Self-pay | Admitting: Physical Therapy

## 2021-04-22 ENCOUNTER — Ambulatory Visit: Payer: Medicare Other | Admitting: Physical Therapy

## 2021-04-22 VITALS — BP 144/90 | HR 98

## 2021-04-22 DIAGNOSIS — R262 Difficulty in walking, not elsewhere classified: Secondary | ICD-10-CM

## 2021-04-22 DIAGNOSIS — R296 Repeated falls: Secondary | ICD-10-CM

## 2021-04-22 DIAGNOSIS — M79605 Pain in left leg: Secondary | ICD-10-CM | POA: Diagnosis not present

## 2021-04-22 DIAGNOSIS — M6281 Muscle weakness (generalized): Secondary | ICD-10-CM
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
## 2021-04-23 ENCOUNTER — Encounter: Payer: Self-pay | Admitting: Physical Therapy

## 2021-04-24 ENCOUNTER — Other Ambulatory Visit: Payer: Self-pay

## 2021-04-24 ENCOUNTER — Ambulatory Visit (HOSPITAL_BASED_OUTPATIENT_CLINIC_OR_DEPARTMENT_OTHER): Payer: Medicare Other | Attending: Family Medicine | Admitting: Physical Therapy

## 2021-04-24 ENCOUNTER — Encounter (HOSPITAL_BASED_OUTPATIENT_CLINIC_OR_DEPARTMENT_OTHER): Payer: Self-pay | Admitting: Physical Therapy

## 2021-04-24 DIAGNOSIS — R609 Edema, unspecified: Secondary | ICD-10-CM | POA: Insufficient documentation

## 2021-04-24 DIAGNOSIS — R296 Repeated falls: Secondary | ICD-10-CM | POA: Insufficient documentation

## 2021-04-24 DIAGNOSIS — R2689 Other abnormalities of gait and mobility: Secondary | ICD-10-CM | POA: Diagnosis present

## 2021-04-24 DIAGNOSIS — R209 Unspecified disturbances of skin sensation: Secondary | ICD-10-CM | POA: Diagnosis present

## 2021-04-24 DIAGNOSIS — R262 Difficulty in walking, not elsewhere classified: Secondary | ICD-10-CM | POA: Diagnosis present

## 2021-04-24 DIAGNOSIS — M6281 Muscle weakness (generalized): Secondary | ICD-10-CM

## 2021-04-24 DIAGNOSIS — M79605 Pain in left leg: Secondary | ICD-10-CM | POA: Diagnosis present
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
## 2021-04-28 ENCOUNTER — Encounter: Payer: Medicare Other | Admitting: Physical Therapy

## 2021-04-29 ENCOUNTER — Other Ambulatory Visit: Payer: Self-pay

## 2021-04-29 ENCOUNTER — Encounter (HOSPITAL_BASED_OUTPATIENT_CLINIC_OR_DEPARTMENT_OTHER): Payer: Self-pay | Admitting: Physical Therapy

## 2021-04-29 ENCOUNTER — Ambulatory Visit (HOSPITAL_BASED_OUTPATIENT_CLINIC_OR_DEPARTMENT_OTHER): Payer: Self-pay | Admitting: Physical Therapy

## 2021-04-29 ENCOUNTER — Ambulatory Visit (HOSPITAL_BASED_OUTPATIENT_CLINIC_OR_DEPARTMENT_OTHER): Payer: Medicare Other | Admitting: Physical Therapy

## 2021-04-29 DIAGNOSIS — M79605 Pain in left leg: Secondary | ICD-10-CM | POA: Diagnosis not present

## 2021-04-29 DIAGNOSIS — R2689 Other abnormalities of gait and mobility: Secondary | ICD-10-CM

## 2021-04-29 DIAGNOSIS — R296 Repeated falls: Secondary | ICD-10-CM

## 2021-04-29 DIAGNOSIS — M6281 Muscle weakness (generalized): Secondary | ICD-10-CM

## 2021-04-29 DIAGNOSIS — R262 Difficulty in walking, not elsewhere classified: Secondary | ICD-10-CM

## 2021-04-29 DIAGNOSIS — R209 Unspecified disturbances of skin sensation: Secondary | ICD-10-CM

## 2021-04-29 DIAGNOSIS — R609 Edema, unspecified: Secondary | ICD-10-CM
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
## 2021-05-01 ENCOUNTER — Ambulatory Visit (HOSPITAL_BASED_OUTPATIENT_CLINIC_OR_DEPARTMENT_OTHER): Payer: Medicare Other | Admitting: Physical Therapy

## 2021-05-01 ENCOUNTER — Encounter (HOSPITAL_BASED_OUTPATIENT_CLINIC_OR_DEPARTMENT_OTHER): Payer: Self-pay | Admitting: Physical Therapy

## 2021-05-01 ENCOUNTER — Other Ambulatory Visit: Payer: Self-pay

## 2021-05-01 DIAGNOSIS — R262 Difficulty in walking, not elsewhere classified: Secondary | ICD-10-CM

## 2021-05-01 DIAGNOSIS — R609 Edema, unspecified: Secondary | ICD-10-CM

## 2021-05-01 DIAGNOSIS — R296 Repeated falls: Secondary | ICD-10-CM

## 2021-05-01 DIAGNOSIS — R209 Unspecified disturbances of skin sensation: Secondary | ICD-10-CM

## 2021-05-01 DIAGNOSIS — M79605 Pain in left leg: Secondary | ICD-10-CM

## 2021-05-01 DIAGNOSIS — R2689 Other abnormalities of gait and mobility: Secondary | ICD-10-CM

## 2021-05-01 DIAGNOSIS — M6281 Muscle weakness (generalized): Secondary | ICD-10-CM
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
## 2021-05-05 ENCOUNTER — Ambulatory Visit (HOSPITAL_BASED_OUTPATIENT_CLINIC_OR_DEPARTMENT_OTHER): Payer: Self-pay | Admitting: Physical Therapy

## 2021-05-06 ENCOUNTER — Encounter (HOSPITAL_BASED_OUTPATIENT_CLINIC_OR_DEPARTMENT_OTHER): Payer: Self-pay | Admitting: Physical Therapy

## 2021-05-06 ENCOUNTER — Ambulatory Visit (HOSPITAL_BASED_OUTPATIENT_CLINIC_OR_DEPARTMENT_OTHER): Payer: Medicare Other | Admitting: Physical Therapy

## 2021-05-06 ENCOUNTER — Other Ambulatory Visit: Payer: Self-pay

## 2021-05-06 ENCOUNTER — Ambulatory Visit (HOSPITAL_BASED_OUTPATIENT_CLINIC_OR_DEPARTMENT_OTHER): Payer: Self-pay | Admitting: Physical Therapy

## 2021-05-06 DIAGNOSIS — R209 Unspecified disturbances of skin sensation: Secondary | ICD-10-CM

## 2021-05-06 DIAGNOSIS — R609 Edema, unspecified: Secondary | ICD-10-CM

## 2021-05-06 DIAGNOSIS — R296 Repeated falls: Secondary | ICD-10-CM

## 2021-05-06 DIAGNOSIS — M79605 Pain in left leg: Secondary | ICD-10-CM | POA: Diagnosis not present

## 2021-05-06 DIAGNOSIS — R262 Difficulty in walking, not elsewhere classified: Secondary | ICD-10-CM

## 2021-05-06 DIAGNOSIS — M6281 Muscle weakness (generalized): Secondary | ICD-10-CM

## 2021-05-06 DIAGNOSIS — R2689 Other abnormalities of gait and mobility: Secondary | ICD-10-CM
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
## 2021-05-07 ENCOUNTER — Other Ambulatory Visit: Payer: Self-pay

## 2021-05-07 ENCOUNTER — Ambulatory Visit: Payer: Medicare Other | Admitting: Physical Therapy

## 2021-05-07 VITALS — BP 140/90 | HR 89

## 2021-05-07 DIAGNOSIS — M6281 Muscle weakness (generalized): Secondary | ICD-10-CM

## 2021-05-07 DIAGNOSIS — R262 Difficulty in walking, not elsewhere classified: Secondary | ICD-10-CM

## 2021-05-07 DIAGNOSIS — R296 Repeated falls: Secondary | ICD-10-CM

## 2021-05-07 DIAGNOSIS — M79605 Pain in left leg: Secondary | ICD-10-CM
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
## 2021-05-08 ENCOUNTER — Encounter: Payer: Self-pay | Admitting: Physical Therapy

## 2021-05-08 ENCOUNTER — Ambulatory Visit (HOSPITAL_BASED_OUTPATIENT_CLINIC_OR_DEPARTMENT_OTHER): Payer: Self-pay | Admitting: Physical Therapy

## 2021-05-12 ENCOUNTER — Ambulatory Visit: Payer: Medicare Other | Admitting: Physical Therapy

## 2021-05-12 ENCOUNTER — Ambulatory Visit: Payer: Medicare Other | Attending: Student | Admitting: Physical Therapy

## 2021-05-12 ENCOUNTER — Encounter: Payer: Self-pay | Admitting: Physical Therapy

## 2021-05-12 VITALS — BP 158/90 | HR 102

## 2021-05-12 DIAGNOSIS — M6281 Muscle weakness (generalized): Secondary | ICD-10-CM | POA: Insufficient documentation

## 2021-05-12 DIAGNOSIS — M79605 Pain in left leg: Secondary | ICD-10-CM | POA: Diagnosis present

## 2021-05-12 DIAGNOSIS — R262 Difficulty in walking, not elsewhere classified: Secondary | ICD-10-CM | POA: Diagnosis present

## 2021-05-12 DIAGNOSIS — R296 Repeated falls: Secondary | ICD-10-CM | POA: Insufficient documentation
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
## 2021-05-13 ENCOUNTER — Ambulatory Visit (HOSPITAL_BASED_OUTPATIENT_CLINIC_OR_DEPARTMENT_OTHER): Payer: Self-pay | Admitting: Physical Therapy

## 2021-05-14 ENCOUNTER — Other Ambulatory Visit: Payer: Self-pay

## 2021-05-14 ENCOUNTER — Encounter (HOSPITAL_BASED_OUTPATIENT_CLINIC_OR_DEPARTMENT_OTHER): Payer: Self-pay | Admitting: Physical Therapy

## 2021-05-14 ENCOUNTER — Ambulatory Visit (HOSPITAL_BASED_OUTPATIENT_CLINIC_OR_DEPARTMENT_OTHER): Payer: Medicare Other | Attending: Family Medicine | Admitting: Physical Therapy

## 2021-05-14 DIAGNOSIS — M79605 Pain in left leg: Secondary | ICD-10-CM

## 2021-05-14 DIAGNOSIS — R209 Unspecified disturbances of skin sensation: Secondary | ICD-10-CM

## 2021-05-14 DIAGNOSIS — R2689 Other abnormalities of gait and mobility: Secondary | ICD-10-CM | POA: Diagnosis present

## 2021-05-14 DIAGNOSIS — R262 Difficulty in walking, not elsewhere classified: Secondary | ICD-10-CM

## 2021-05-14 DIAGNOSIS — R296 Repeated falls: Secondary | ICD-10-CM

## 2021-05-14 DIAGNOSIS — M6281 Muscle weakness (generalized): Secondary | ICD-10-CM

## 2021-05-14 DIAGNOSIS — R609 Edema, unspecified: Secondary | ICD-10-CM | POA: Diagnosis present
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
## 2021-05-15 ENCOUNTER — Encounter: Payer: Medicare Other | Admitting: Physical Therapy

## 2021-05-16 ENCOUNTER — Ambulatory Visit (HOSPITAL_BASED_OUTPATIENT_CLINIC_OR_DEPARTMENT_OTHER): Payer: Self-pay | Admitting: Physical Therapy

## 2021-05-20 ENCOUNTER — Encounter (HOSPITAL_BASED_OUTPATIENT_CLINIC_OR_DEPARTMENT_OTHER): Payer: Self-pay | Admitting: Physical Therapy

## 2021-05-20 ENCOUNTER — Ambulatory Visit (HOSPITAL_BASED_OUTPATIENT_CLINIC_OR_DEPARTMENT_OTHER): Payer: Medicare Other | Admitting: Physical Therapy

## 2021-05-20 ENCOUNTER — Other Ambulatory Visit: Payer: Self-pay

## 2021-05-20 DIAGNOSIS — R609 Edema, unspecified: Secondary | ICD-10-CM

## 2021-05-20 DIAGNOSIS — R2689 Other abnormalities of gait and mobility: Secondary | ICD-10-CM

## 2021-05-20 DIAGNOSIS — R262 Difficulty in walking, not elsewhere classified: Secondary | ICD-10-CM

## 2021-05-20 DIAGNOSIS — M6281 Muscle weakness (generalized): Secondary | ICD-10-CM

## 2021-05-20 DIAGNOSIS — M79605 Pain in left leg: Secondary | ICD-10-CM | POA: Diagnosis not present

## 2021-05-20 DIAGNOSIS — R209 Unspecified disturbances of skin sensation: Secondary | ICD-10-CM

## 2021-05-20 DIAGNOSIS — R296 Repeated falls: Secondary | ICD-10-CM
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
## 2021-05-22 ENCOUNTER — Other Ambulatory Visit: Payer: Self-pay

## 2021-05-22 ENCOUNTER — Ambulatory Visit (HOSPITAL_BASED_OUTPATIENT_CLINIC_OR_DEPARTMENT_OTHER): Payer: Self-pay | Admitting: Physical Therapy

## 2021-05-22 ENCOUNTER — Encounter (HOSPITAL_BASED_OUTPATIENT_CLINIC_OR_DEPARTMENT_OTHER): Payer: Self-pay | Admitting: Physical Therapy

## 2021-05-22 ENCOUNTER — Ambulatory Visit (HOSPITAL_BASED_OUTPATIENT_CLINIC_OR_DEPARTMENT_OTHER): Payer: Medicare Other | Admitting: Physical Therapy

## 2021-05-22 DIAGNOSIS — M6281 Muscle weakness (generalized): Secondary | ICD-10-CM

## 2021-05-22 DIAGNOSIS — M79605 Pain in left leg: Secondary | ICD-10-CM

## 2021-05-22 DIAGNOSIS — R209 Unspecified disturbances of skin sensation: Secondary | ICD-10-CM

## 2021-05-22 DIAGNOSIS — R296 Repeated falls: Secondary | ICD-10-CM

## 2021-05-22 DIAGNOSIS — R609 Edema, unspecified: Secondary | ICD-10-CM

## 2021-05-22 DIAGNOSIS — R262 Difficulty in walking, not elsewhere classified: Secondary | ICD-10-CM

## 2021-05-22 DIAGNOSIS — R2689 Other abnormalities of gait and mobility: Secondary | ICD-10-CM
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
## 2021-05-26 ENCOUNTER — Ambulatory Visit (INDEPENDENT_AMBULATORY_CARE_PROVIDER_SITE_OTHER): Payer: Medicare Other | Admitting: Vascular Surgery

## 2021-05-27 ENCOUNTER — Encounter: Payer: Self-pay | Admitting: Physical Therapy

## 2021-05-27 ENCOUNTER — Ambulatory Visit: Payer: Medicare Other | Admitting: Physical Therapy

## 2021-05-27 ENCOUNTER — Encounter: Payer: Medicare Other | Admitting: Physical Therapy

## 2021-05-27 VITALS — BP 170/96 | HR 98

## 2021-05-27 DIAGNOSIS — R262 Difficulty in walking, not elsewhere classified: Secondary | ICD-10-CM

## 2021-05-27 DIAGNOSIS — M6281 Muscle weakness (generalized): Secondary | ICD-10-CM

## 2021-05-27 DIAGNOSIS — M79605 Pain in left leg: Secondary | ICD-10-CM

## 2021-05-27 DIAGNOSIS — R296 Repeated falls: Secondary | ICD-10-CM
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
## 2021-05-28 ENCOUNTER — Encounter: Payer: Medicare Other | Admitting: Physical Therapy

## 2021-05-29 ENCOUNTER — Encounter: Payer: Medicare Other | Admitting: Physical Therapy

## 2021-06-03 ENCOUNTER — Other Ambulatory Visit: Payer: Self-pay

## 2021-06-03 ENCOUNTER — Encounter (HOSPITAL_BASED_OUTPATIENT_CLINIC_OR_DEPARTMENT_OTHER): Payer: Self-pay | Admitting: Physical Therapy

## 2021-06-03 ENCOUNTER — Ambulatory Visit (HOSPITAL_BASED_OUTPATIENT_CLINIC_OR_DEPARTMENT_OTHER): Payer: Medicare Other | Admitting: Physical Therapy

## 2021-06-03 DIAGNOSIS — M79605 Pain in left leg: Secondary | ICD-10-CM

## 2021-06-03 DIAGNOSIS — R2689 Other abnormalities of gait and mobility: Secondary | ICD-10-CM

## 2021-06-03 DIAGNOSIS — R609 Edema, unspecified: Secondary | ICD-10-CM

## 2021-06-03 DIAGNOSIS — R296 Repeated falls: Secondary | ICD-10-CM

## 2021-06-03 DIAGNOSIS — R262 Difficulty in walking, not elsewhere classified: Secondary | ICD-10-CM

## 2021-06-03 DIAGNOSIS — R209 Unspecified disturbances of skin sensation: Secondary | ICD-10-CM

## 2021-06-03 DIAGNOSIS — M6281 Muscle weakness (generalized): Secondary | ICD-10-CM
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
## 2021-06-04 ENCOUNTER — Ambulatory Visit (HOSPITAL_BASED_OUTPATIENT_CLINIC_OR_DEPARTMENT_OTHER): Payer: Self-pay | Admitting: Physical Therapy

## 2021-06-05 ENCOUNTER — Other Ambulatory Visit: Payer: Self-pay

## 2021-06-05 ENCOUNTER — Encounter (HOSPITAL_BASED_OUTPATIENT_CLINIC_OR_DEPARTMENT_OTHER): Payer: Self-pay | Admitting: Physical Therapy

## 2021-06-05 ENCOUNTER — Ambulatory Visit (HOSPITAL_BASED_OUTPATIENT_CLINIC_OR_DEPARTMENT_OTHER): Payer: Medicare Other | Admitting: Physical Therapy

## 2021-06-05 DIAGNOSIS — M79605 Pain in left leg: Secondary | ICD-10-CM

## 2021-06-05 DIAGNOSIS — M6281 Muscle weakness (generalized): Secondary | ICD-10-CM

## 2021-06-05 DIAGNOSIS — R209 Unspecified disturbances of skin sensation: Secondary | ICD-10-CM

## 2021-06-05 DIAGNOSIS — R609 Edema, unspecified: Secondary | ICD-10-CM

## 2021-06-05 DIAGNOSIS — R262 Difficulty in walking, not elsewhere classified: Secondary | ICD-10-CM

## 2021-06-05 DIAGNOSIS — R2689 Other abnormalities of gait and mobility: Secondary | ICD-10-CM

## 2021-06-05 DIAGNOSIS — R296 Repeated falls: Secondary | ICD-10-CM
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
## 2021-06-10 ENCOUNTER — Other Ambulatory Visit: Payer: Self-pay

## 2021-06-10 ENCOUNTER — Encounter (HOSPITAL_BASED_OUTPATIENT_CLINIC_OR_DEPARTMENT_OTHER): Payer: Self-pay | Admitting: Physical Therapy

## 2021-06-10 ENCOUNTER — Ambulatory Visit (HOSPITAL_BASED_OUTPATIENT_CLINIC_OR_DEPARTMENT_OTHER): Payer: Medicare Other | Admitting: Physical Therapy

## 2021-06-10 DIAGNOSIS — R262 Difficulty in walking, not elsewhere classified: Secondary | ICD-10-CM

## 2021-06-10 DIAGNOSIS — R209 Unspecified disturbances of skin sensation: Secondary | ICD-10-CM

## 2021-06-10 DIAGNOSIS — M79605 Pain in left leg: Secondary | ICD-10-CM | POA: Diagnosis not present

## 2021-06-10 DIAGNOSIS — M6281 Muscle weakness (generalized): Secondary | ICD-10-CM

## 2021-06-10 DIAGNOSIS — R609 Edema, unspecified: Secondary | ICD-10-CM

## 2021-06-10 DIAGNOSIS — R2689 Other abnormalities of gait and mobility: Secondary | ICD-10-CM

## 2021-06-10 DIAGNOSIS — R296 Repeated falls: Secondary | ICD-10-CM
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
## 2021-06-11 ENCOUNTER — Ambulatory Visit (HOSPITAL_BASED_OUTPATIENT_CLINIC_OR_DEPARTMENT_OTHER): Payer: Self-pay | Admitting: Physical Therapy

## 2021-06-12 ENCOUNTER — Other Ambulatory Visit: Payer: Self-pay

## 2021-06-12 ENCOUNTER — Ambulatory Visit (HOSPITAL_BASED_OUTPATIENT_CLINIC_OR_DEPARTMENT_OTHER): Payer: Medicare Other | Attending: Family Medicine | Admitting: Physical Therapy

## 2021-06-12 ENCOUNTER — Encounter (HOSPITAL_BASED_OUTPATIENT_CLINIC_OR_DEPARTMENT_OTHER): Payer: Self-pay | Admitting: Physical Therapy

## 2021-06-12 DIAGNOSIS — R2689 Other abnormalities of gait and mobility: Secondary | ICD-10-CM | POA: Diagnosis present

## 2021-06-12 DIAGNOSIS — M6281 Muscle weakness (generalized): Secondary | ICD-10-CM | POA: Diagnosis present

## 2021-06-12 DIAGNOSIS — R209 Unspecified disturbances of skin sensation: Secondary | ICD-10-CM | POA: Insufficient documentation

## 2021-06-12 DIAGNOSIS — R262 Difficulty in walking, not elsewhere classified: Secondary | ICD-10-CM | POA: Diagnosis present

## 2021-06-12 DIAGNOSIS — M79605 Pain in left leg: Secondary | ICD-10-CM | POA: Diagnosis not present

## 2021-06-12 DIAGNOSIS — R296 Repeated falls: Secondary | ICD-10-CM | POA: Insufficient documentation

## 2021-06-12 DIAGNOSIS — R609 Edema, unspecified: Secondary | ICD-10-CM | POA: Insufficient documentation
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
## 2021-06-17 ENCOUNTER — Ambulatory Visit: Payer: Medicare Other | Attending: Student | Admitting: Physical Therapy

## 2021-06-17 ENCOUNTER — Encounter: Payer: Self-pay | Admitting: Physical Therapy

## 2021-06-17 VITALS — BP 156/98 | HR 90

## 2021-06-17 DIAGNOSIS — R296 Repeated falls: Secondary | ICD-10-CM | POA: Insufficient documentation

## 2021-06-17 DIAGNOSIS — M79605 Pain in left leg: Secondary | ICD-10-CM | POA: Insufficient documentation

## 2021-06-17 DIAGNOSIS — R262 Difficulty in walking, not elsewhere classified: Secondary | ICD-10-CM | POA: Diagnosis present

## 2021-06-17 DIAGNOSIS — M6281 Muscle weakness (generalized): Secondary | ICD-10-CM | POA: Diagnosis present
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
## 2021-06-18 ENCOUNTER — Other Ambulatory Visit: Payer: Self-pay

## 2021-06-18 ENCOUNTER — Ambulatory Visit (HOSPITAL_BASED_OUTPATIENT_CLINIC_OR_DEPARTMENT_OTHER): Payer: Medicare Other | Admitting: Physical Therapy

## 2021-06-18 DIAGNOSIS — R2689 Other abnormalities of gait and mobility: Secondary | ICD-10-CM

## 2021-06-18 DIAGNOSIS — R209 Unspecified disturbances of skin sensation: Secondary | ICD-10-CM

## 2021-06-18 DIAGNOSIS — M79605 Pain in left leg: Secondary | ICD-10-CM | POA: Diagnosis not present

## 2021-06-18 DIAGNOSIS — M6281 Muscle weakness (generalized): Secondary | ICD-10-CM

## 2021-06-18 DIAGNOSIS — R609 Edema, unspecified: Secondary | ICD-10-CM

## 2021-06-18 DIAGNOSIS — R262 Difficulty in walking, not elsewhere classified: Secondary | ICD-10-CM

## 2021-06-18 DIAGNOSIS — R296 Repeated falls: Secondary | ICD-10-CM
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
## 2021-06-24 ENCOUNTER — Ambulatory Visit (HOSPITAL_BASED_OUTPATIENT_CLINIC_OR_DEPARTMENT_OTHER): Payer: Medicare Other | Admitting: Physical Therapy

## 2021-06-24 ENCOUNTER — Encounter (HOSPITAL_BASED_OUTPATIENT_CLINIC_OR_DEPARTMENT_OTHER): Payer: Self-pay | Admitting: Physical Therapy

## 2021-06-24 ENCOUNTER — Other Ambulatory Visit: Payer: Self-pay

## 2021-06-24 DIAGNOSIS — R209 Unspecified disturbances of skin sensation: Secondary | ICD-10-CM

## 2021-06-24 DIAGNOSIS — R609 Edema, unspecified: Secondary | ICD-10-CM

## 2021-06-24 DIAGNOSIS — R296 Repeated falls: Secondary | ICD-10-CM

## 2021-06-24 DIAGNOSIS — M79605 Pain in left leg: Secondary | ICD-10-CM | POA: Diagnosis not present

## 2021-06-24 DIAGNOSIS — M6281 Muscle weakness (generalized): Secondary | ICD-10-CM

## 2021-06-24 DIAGNOSIS — R262 Difficulty in walking, not elsewhere classified: Secondary | ICD-10-CM

## 2021-06-24 DIAGNOSIS — R2689 Other abnormalities of gait and mobility: Secondary | ICD-10-CM
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
## 2021-06-25 ENCOUNTER — Ambulatory Visit (HOSPITAL_BASED_OUTPATIENT_CLINIC_OR_DEPARTMENT_OTHER): Payer: Self-pay | Admitting: Physical Therapy

## 2021-06-26 ENCOUNTER — Encounter (HOSPITAL_BASED_OUTPATIENT_CLINIC_OR_DEPARTMENT_OTHER): Payer: Self-pay | Admitting: Physical Therapy

## 2021-06-26 ENCOUNTER — Ambulatory Visit (HOSPITAL_BASED_OUTPATIENT_CLINIC_OR_DEPARTMENT_OTHER): Payer: Medicare Other | Admitting: Physical Therapy

## 2021-06-26 ENCOUNTER — Other Ambulatory Visit: Payer: Self-pay

## 2021-06-26 DIAGNOSIS — M6281 Muscle weakness (generalized): Secondary | ICD-10-CM

## 2021-06-26 DIAGNOSIS — R2689 Other abnormalities of gait and mobility: Secondary | ICD-10-CM

## 2021-06-26 DIAGNOSIS — M79605 Pain in left leg: Secondary | ICD-10-CM | POA: Diagnosis not present

## 2021-06-26 DIAGNOSIS — R296 Repeated falls: Secondary | ICD-10-CM

## 2021-06-26 DIAGNOSIS — R262 Difficulty in walking, not elsewhere classified: Secondary | ICD-10-CM
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
## 2021-06-27 IMAGING — DX DG TIBIA/FIBULA 2V*L*
5 series · 5 of 5 positions shown · non-contrast
Comparison: 05/25/2016

CLINICAL DATA: Pain below the left knee.

EXAM:
LEFT TIBIA AND FIBULA - 2 VIEW

[tibia ap (1 of 2)]
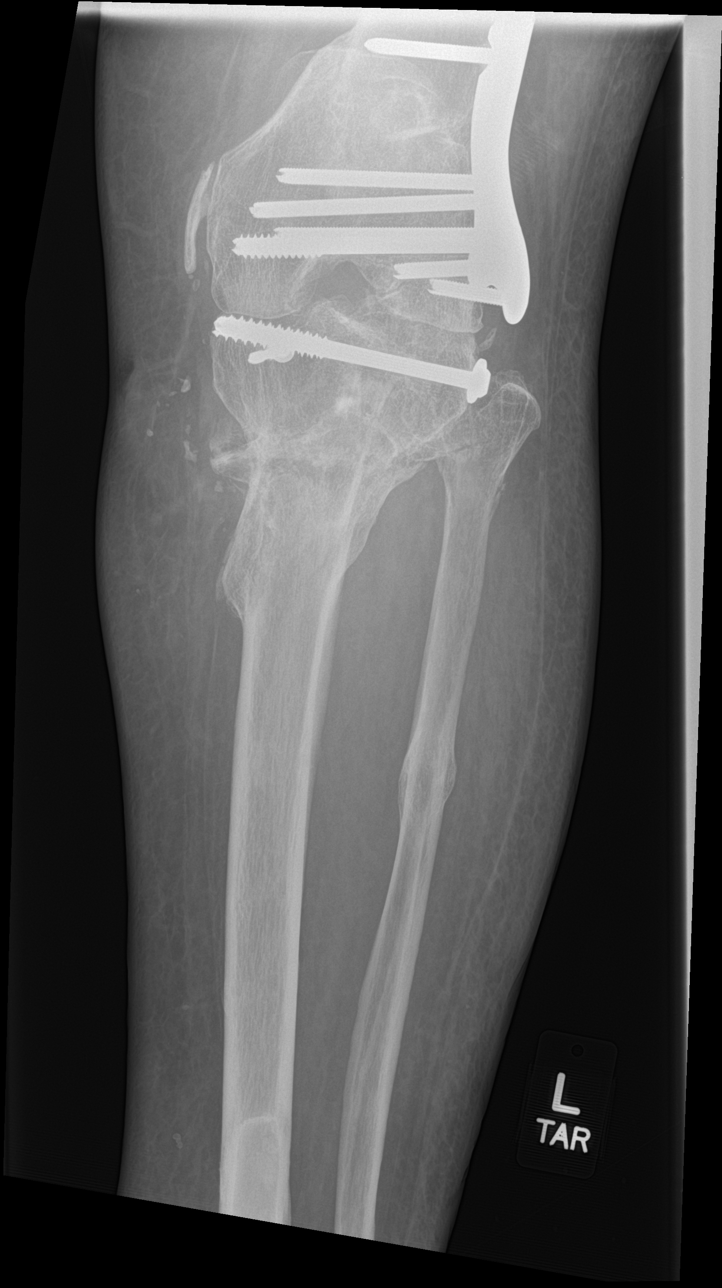

[tibia ap (2 of 2)]
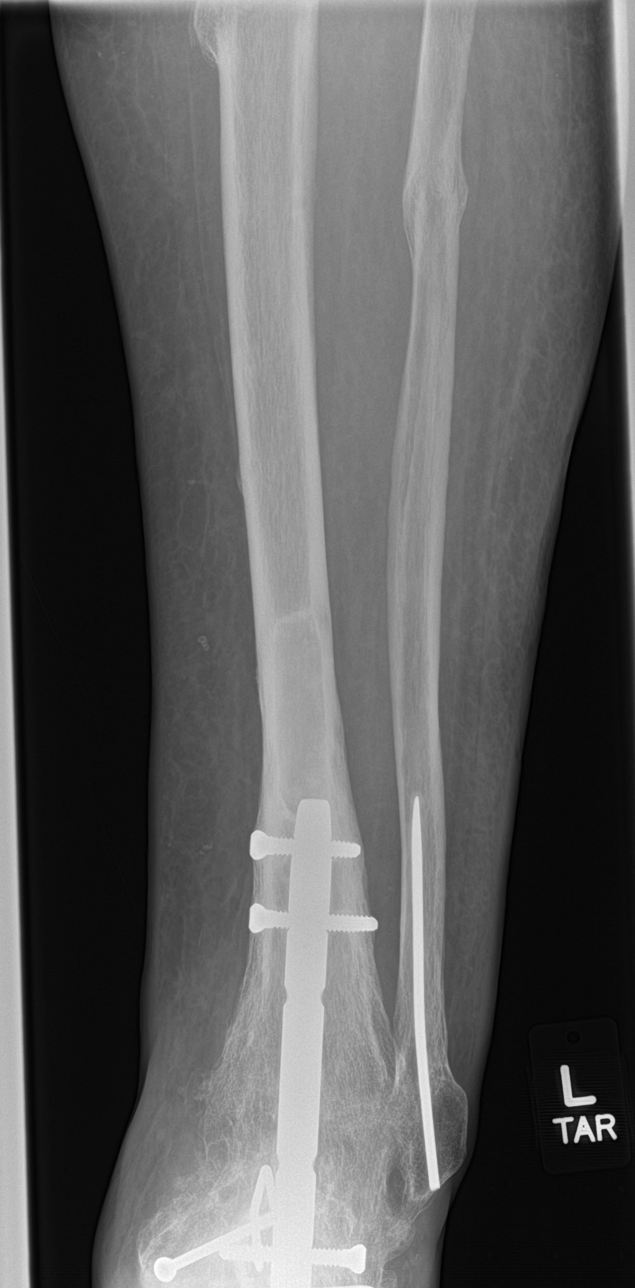

[tibia lat (1 of 3)]
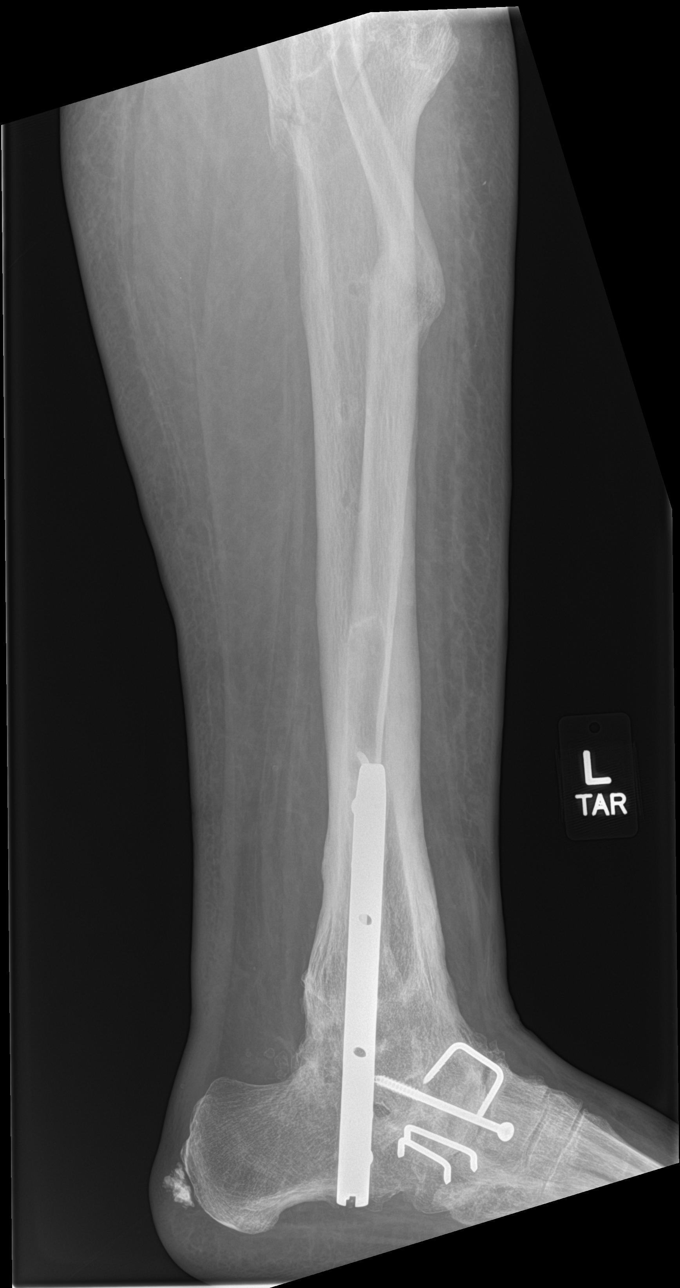

[tibia lat (2 of 3)]
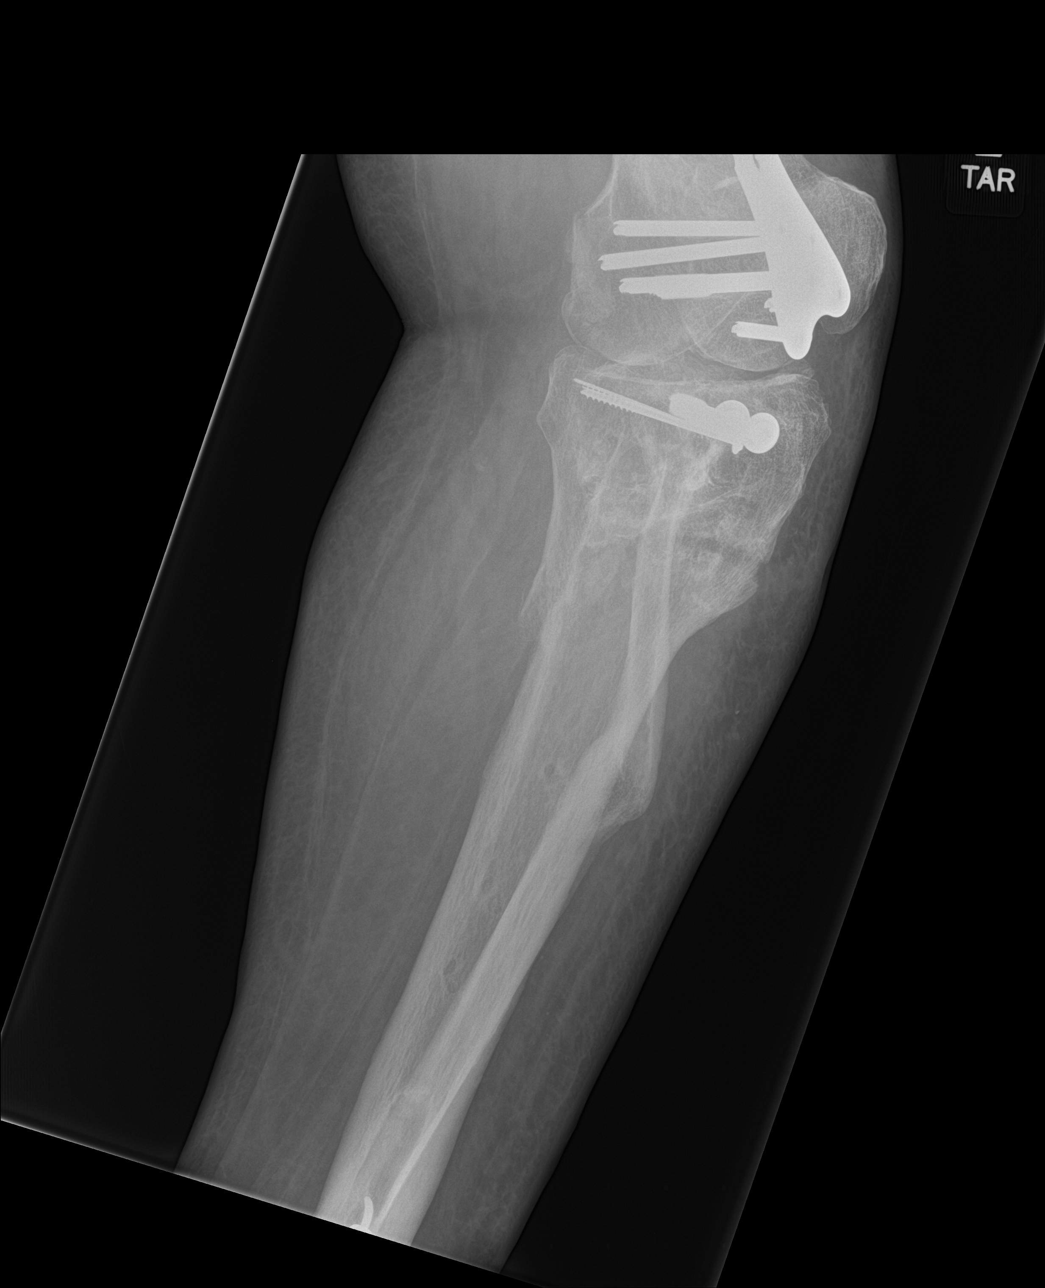

[tibia lat (3 of 3)]
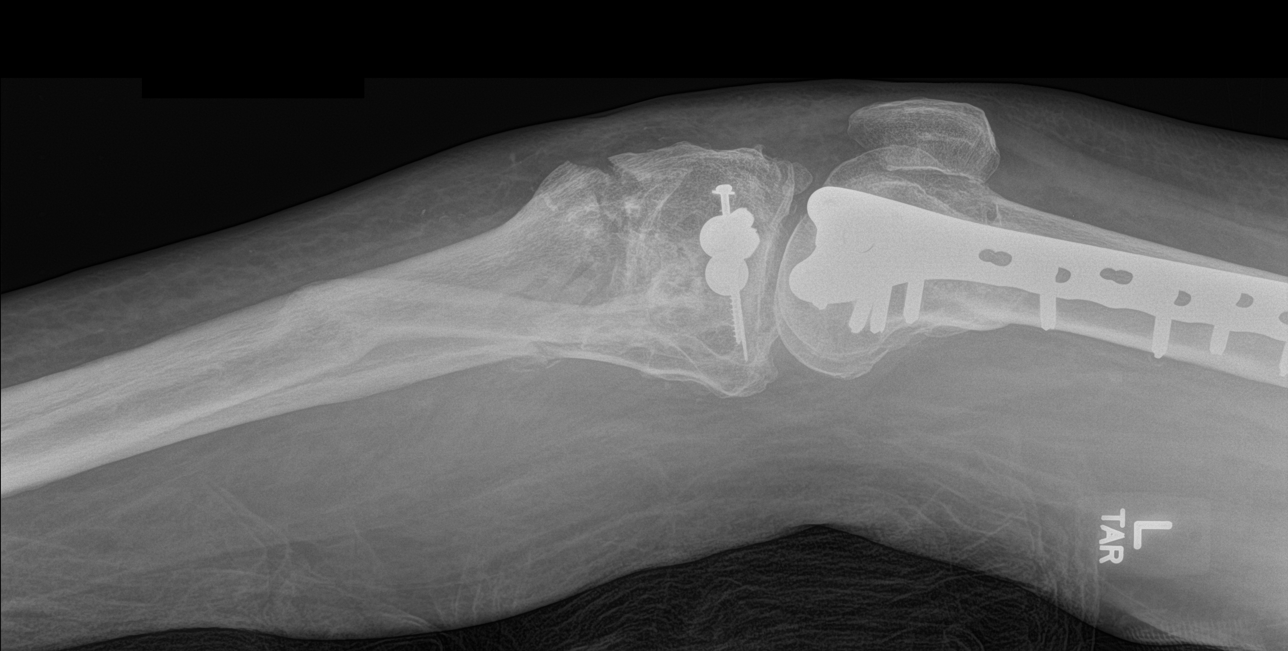

[5 of 5 positions shown; findings below may reference images not displayed]

FINDINGS: Extensive postsurgical changes involving the distal femur with
lateral plate and screw fixation. Old fracture of the distal femur.
Surgical screws involving the tibial plateau region. Old
posttraumatic changes involving the proximal tibia and tibial
plateau. There is a new fracture involving the proximal tibia distal
to the surgical screws. This fracture is mildly displaced and best
seen on the lateral views. Old fracture in the mid fibula. Previous
surgical fusion at the ankle with additional surgical hardware in
the tarsal bones and distal fibula. No acute bone abnormality
involving the ankle.
IMPRESSION: New displaced fracture involving the proximal tibia. The fracture is
distal to the surgical screws.

## 2021-07-01 ENCOUNTER — Other Ambulatory Visit: Payer: Self-pay

## 2021-07-01 ENCOUNTER — Ambulatory Visit (HOSPITAL_BASED_OUTPATIENT_CLINIC_OR_DEPARTMENT_OTHER): Payer: Medicare Other | Admitting: Physical Therapy

## 2021-07-01 ENCOUNTER — Encounter (HOSPITAL_BASED_OUTPATIENT_CLINIC_OR_DEPARTMENT_OTHER): Payer: Self-pay | Admitting: Physical Therapy

## 2021-07-01 DIAGNOSIS — R262 Difficulty in walking, not elsewhere classified: Secondary | ICD-10-CM

## 2021-07-01 DIAGNOSIS — M79605 Pain in left leg: Secondary | ICD-10-CM

## 2021-07-01 DIAGNOSIS — R296 Repeated falls: Secondary | ICD-10-CM

## 2021-07-01 DIAGNOSIS — R2689 Other abnormalities of gait and mobility: Secondary | ICD-10-CM

## 2021-07-01 DIAGNOSIS — M6281 Muscle weakness (generalized): Secondary | ICD-10-CM
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
## 2021-07-02 ENCOUNTER — Encounter: Payer: Medicare Other | Admitting: Physical Therapy

## 2021-07-02 ENCOUNTER — Ambulatory Visit (HOSPITAL_BASED_OUTPATIENT_CLINIC_OR_DEPARTMENT_OTHER): Payer: Self-pay | Admitting: Physical Therapy

## 2021-07-03 ENCOUNTER — Encounter: Payer: Self-pay | Admitting: Physical Therapy

## 2021-07-03 ENCOUNTER — Ambulatory Visit: Payer: Medicare Other | Admitting: Physical Therapy

## 2021-07-03 DIAGNOSIS — R296 Repeated falls: Secondary | ICD-10-CM

## 2021-07-03 DIAGNOSIS — M79605 Pain in left leg: Secondary | ICD-10-CM | POA: Diagnosis not present

## 2021-07-03 DIAGNOSIS — R262 Difficulty in walking, not elsewhere classified: Secondary | ICD-10-CM

## 2021-07-03 DIAGNOSIS — M6281 Muscle weakness (generalized): Secondary | ICD-10-CM
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
## 2021-07-07 ENCOUNTER — Encounter (HOSPITAL_BASED_OUTPATIENT_CLINIC_OR_DEPARTMENT_OTHER): Payer: Self-pay | Admitting: Physical Therapy

## 2021-07-07 ENCOUNTER — Other Ambulatory Visit: Payer: Self-pay

## 2021-07-07 ENCOUNTER — Ambulatory Visit (HOSPITAL_BASED_OUTPATIENT_CLINIC_OR_DEPARTMENT_OTHER): Payer: Medicare Other | Admitting: Physical Therapy

## 2021-07-07 DIAGNOSIS — R2689 Other abnormalities of gait and mobility: Secondary | ICD-10-CM

## 2021-07-07 DIAGNOSIS — M79605 Pain in left leg: Secondary | ICD-10-CM | POA: Diagnosis not present

## 2021-07-07 DIAGNOSIS — R262 Difficulty in walking, not elsewhere classified: Secondary | ICD-10-CM

## 2021-07-07 DIAGNOSIS — M6281 Muscle weakness (generalized): Secondary | ICD-10-CM

## 2021-07-07 DIAGNOSIS — R296 Repeated falls: Secondary | ICD-10-CM
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
## 2021-07-09 ENCOUNTER — Other Ambulatory Visit: Payer: Self-pay

## 2021-07-09 ENCOUNTER — Encounter (HOSPITAL_BASED_OUTPATIENT_CLINIC_OR_DEPARTMENT_OTHER): Payer: Self-pay | Admitting: Physical Therapy

## 2021-07-09 ENCOUNTER — Ambulatory Visit (HOSPITAL_BASED_OUTPATIENT_CLINIC_OR_DEPARTMENT_OTHER): Payer: Medicare Other | Admitting: Physical Therapy

## 2021-07-09 DIAGNOSIS — R262 Difficulty in walking, not elsewhere classified: Secondary | ICD-10-CM

## 2021-07-09 DIAGNOSIS — M79605 Pain in left leg: Secondary | ICD-10-CM

## 2021-07-09 DIAGNOSIS — R296 Repeated falls: Secondary | ICD-10-CM

## 2021-07-09 DIAGNOSIS — R2689 Other abnormalities of gait and mobility: Secondary | ICD-10-CM

## 2021-07-09 DIAGNOSIS — M6281 Muscle weakness (generalized): Secondary | ICD-10-CM
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
## 2021-07-14 ENCOUNTER — Encounter (HOSPITAL_BASED_OUTPATIENT_CLINIC_OR_DEPARTMENT_OTHER): Payer: Self-pay | Admitting: Physical Therapy

## 2021-07-14 ENCOUNTER — Ambulatory Visit (HOSPITAL_BASED_OUTPATIENT_CLINIC_OR_DEPARTMENT_OTHER): Payer: Medicare Other | Attending: Family Medicine | Admitting: Physical Therapy

## 2021-07-14 ENCOUNTER — Other Ambulatory Visit: Payer: Self-pay

## 2021-07-14 DIAGNOSIS — R609 Edema, unspecified: Secondary | ICD-10-CM | POA: Insufficient documentation

## 2021-07-14 DIAGNOSIS — M25561 Pain in right knee: Secondary | ICD-10-CM | POA: Insufficient documentation

## 2021-07-14 DIAGNOSIS — R262 Difficulty in walking, not elsewhere classified: Secondary | ICD-10-CM | POA: Insufficient documentation

## 2021-07-14 DIAGNOSIS — M79605 Pain in left leg: Secondary | ICD-10-CM | POA: Insufficient documentation

## 2021-07-14 DIAGNOSIS — R296 Repeated falls: Secondary | ICD-10-CM | POA: Insufficient documentation

## 2021-07-14 DIAGNOSIS — R209 Unspecified disturbances of skin sensation: Secondary | ICD-10-CM | POA: Diagnosis present

## 2021-07-14 DIAGNOSIS — R2681 Unsteadiness on feet: Secondary | ICD-10-CM | POA: Diagnosis present

## 2021-07-14 DIAGNOSIS — R293 Abnormal posture: Secondary | ICD-10-CM | POA: Diagnosis present

## 2021-07-14 DIAGNOSIS — G8929 Other chronic pain: Secondary | ICD-10-CM | POA: Insufficient documentation

## 2021-07-14 DIAGNOSIS — R2689 Other abnormalities of gait and mobility: Secondary | ICD-10-CM | POA: Diagnosis present

## 2021-07-14 DIAGNOSIS — M6281 Muscle weakness (generalized): Secondary | ICD-10-CM | POA: Insufficient documentation
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
## 2021-07-16 ENCOUNTER — Encounter (HOSPITAL_BASED_OUTPATIENT_CLINIC_OR_DEPARTMENT_OTHER): Payer: Self-pay | Admitting: Physical Therapy

## 2021-07-16 ENCOUNTER — Other Ambulatory Visit: Payer: Self-pay

## 2021-07-16 ENCOUNTER — Ambulatory Visit (HOSPITAL_BASED_OUTPATIENT_CLINIC_OR_DEPARTMENT_OTHER): Payer: Medicare Other | Admitting: Physical Therapy

## 2021-07-16 DIAGNOSIS — R262 Difficulty in walking, not elsewhere classified: Secondary | ICD-10-CM

## 2021-07-16 DIAGNOSIS — M6281 Muscle weakness (generalized): Secondary | ICD-10-CM

## 2021-07-16 DIAGNOSIS — M79605 Pain in left leg: Secondary | ICD-10-CM | POA: Diagnosis not present

## 2021-07-16 DIAGNOSIS — R296 Repeated falls: Secondary | ICD-10-CM

## 2021-07-16 DIAGNOSIS — R2689 Other abnormalities of gait and mobility: Secondary | ICD-10-CM
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
## 2021-07-21 ENCOUNTER — Ambulatory Visit: Payer: Medicare Other | Attending: Student | Admitting: Physical Therapy

## 2021-07-21 ENCOUNTER — Encounter: Payer: Self-pay | Admitting: Physical Therapy

## 2021-07-21 VITALS — BP 130/90 | HR 95

## 2021-07-21 DIAGNOSIS — R262 Difficulty in walking, not elsewhere classified: Secondary | ICD-10-CM | POA: Insufficient documentation

## 2021-07-21 DIAGNOSIS — R296 Repeated falls: Secondary | ICD-10-CM | POA: Insufficient documentation

## 2021-07-21 DIAGNOSIS — M6281 Muscle weakness (generalized): Secondary | ICD-10-CM | POA: Insufficient documentation

## 2021-07-21 DIAGNOSIS — M79605 Pain in left leg: Secondary | ICD-10-CM | POA: Diagnosis not present
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
## 2021-07-21 NOTE — Progress Notes (Signed)
Photo documentation of wound on L LE 07/21/2021  Luretha Murphy. [name], PT, DPT 07/21/21, 10:46 AM

## 2021-07-23 ENCOUNTER — Ambulatory Visit (HOSPITAL_BASED_OUTPATIENT_CLINIC_OR_DEPARTMENT_OTHER): Payer: Medicare Other | Admitting: Physical Therapy

## 2021-07-23 ENCOUNTER — Encounter (HOSPITAL_BASED_OUTPATIENT_CLINIC_OR_DEPARTMENT_OTHER): Payer: Self-pay | Admitting: Physical Therapy

## 2021-07-23 ENCOUNTER — Other Ambulatory Visit: Payer: Self-pay

## 2021-07-23 DIAGNOSIS — R296 Repeated falls: Secondary | ICD-10-CM

## 2021-07-23 DIAGNOSIS — R2689 Other abnormalities of gait and mobility: Secondary | ICD-10-CM

## 2021-07-23 DIAGNOSIS — R262 Difficulty in walking, not elsewhere classified: Secondary | ICD-10-CM

## 2021-07-23 DIAGNOSIS — M6281 Muscle weakness (generalized): Secondary | ICD-10-CM

## 2021-07-23 DIAGNOSIS — R609 Edema, unspecified: Secondary | ICD-10-CM

## 2021-07-23 DIAGNOSIS — R209 Unspecified disturbances of skin sensation: Secondary | ICD-10-CM

## 2021-07-23 DIAGNOSIS — M79605 Pain in left leg: Secondary | ICD-10-CM
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
## 2021-07-28 ENCOUNTER — Other Ambulatory Visit: Payer: Self-pay

## 2021-07-28 ENCOUNTER — Ambulatory Visit (HOSPITAL_BASED_OUTPATIENT_CLINIC_OR_DEPARTMENT_OTHER): Payer: Medicare Other | Admitting: Physical Therapy

## 2021-07-28 ENCOUNTER — Encounter (HOSPITAL_BASED_OUTPATIENT_CLINIC_OR_DEPARTMENT_OTHER): Payer: Self-pay | Admitting: Physical Therapy

## 2021-07-28 DIAGNOSIS — G8929 Other chronic pain: Secondary | ICD-10-CM

## 2021-07-28 DIAGNOSIS — R2681 Unsteadiness on feet: Secondary | ICD-10-CM

## 2021-07-28 DIAGNOSIS — R2689 Other abnormalities of gait and mobility: Secondary | ICD-10-CM

## 2021-07-28 DIAGNOSIS — M79605 Pain in left leg: Secondary | ICD-10-CM | POA: Diagnosis not present

## 2021-07-28 DIAGNOSIS — R293 Abnormal posture: Secondary | ICD-10-CM

## 2021-07-28 DIAGNOSIS — M6281 Muscle weakness (generalized): Secondary | ICD-10-CM

## 2021-07-28 DIAGNOSIS — R262 Difficulty in walking, not elsewhere classified: Secondary | ICD-10-CM
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
## 2021-07-30 ENCOUNTER — Ambulatory Visit (HOSPITAL_BASED_OUTPATIENT_CLINIC_OR_DEPARTMENT_OTHER): Payer: Medicare Other | Admitting: Physical Therapy

## 2021-07-30 ENCOUNTER — Other Ambulatory Visit: Payer: Self-pay

## 2021-07-30 DIAGNOSIS — R2681 Unsteadiness on feet: Secondary | ICD-10-CM

## 2021-07-30 DIAGNOSIS — G8929 Other chronic pain: Secondary | ICD-10-CM

## 2021-07-30 DIAGNOSIS — R2689 Other abnormalities of gait and mobility: Secondary | ICD-10-CM

## 2021-07-30 DIAGNOSIS — R262 Difficulty in walking, not elsewhere classified: Secondary | ICD-10-CM

## 2021-07-30 DIAGNOSIS — R293 Abnormal posture: Secondary | ICD-10-CM

## 2021-07-30 DIAGNOSIS — M6281 Muscle weakness (generalized): Secondary | ICD-10-CM

## 2021-07-30 DIAGNOSIS — M79605 Pain in left leg: Secondary | ICD-10-CM | POA: Diagnosis not present
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
## 2021-08-04 ENCOUNTER — Other Ambulatory Visit: Payer: Self-pay

## 2021-08-04 ENCOUNTER — Encounter (HOSPITAL_BASED_OUTPATIENT_CLINIC_OR_DEPARTMENT_OTHER): Payer: Medicare Other | Attending: Family Medicine | Admitting: Physical Therapy

## 2021-08-04 ENCOUNTER — Encounter (HOSPITAL_BASED_OUTPATIENT_CLINIC_OR_DEPARTMENT_OTHER): Payer: Self-pay | Admitting: Physical Therapy

## 2021-08-04 DIAGNOSIS — M25561 Pain in right knee: Secondary | ICD-10-CM | POA: Insufficient documentation

## 2021-08-04 DIAGNOSIS — R2689 Other abnormalities of gait and mobility: Secondary | ICD-10-CM | POA: Insufficient documentation

## 2021-08-04 DIAGNOSIS — G8929 Other chronic pain: Secondary | ICD-10-CM | POA: Insufficient documentation

## 2021-08-04 DIAGNOSIS — M6281 Muscle weakness (generalized): Secondary | ICD-10-CM | POA: Diagnosis present

## 2021-08-04 DIAGNOSIS — R262 Difficulty in walking, not elsewhere classified: Secondary | ICD-10-CM | POA: Diagnosis present

## 2021-08-04 DIAGNOSIS — R293 Abnormal posture: Secondary | ICD-10-CM | POA: Diagnosis not present

## 2021-08-04 DIAGNOSIS — R2681 Unsteadiness on feet: Secondary | ICD-10-CM | POA: Insufficient documentation
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
## 2021-08-11 ENCOUNTER — Encounter: Payer: Self-pay | Admitting: Physical Therapy

## 2021-08-11 ENCOUNTER — Ambulatory Visit: Payer: Medicare Other | Admitting: Physical Therapy

## 2021-08-11 VITALS — BP 136/80 | HR 98

## 2021-08-11 DIAGNOSIS — R262 Difficulty in walking, not elsewhere classified: Secondary | ICD-10-CM

## 2021-08-11 DIAGNOSIS — M6281 Muscle weakness (generalized): Secondary | ICD-10-CM

## 2021-08-11 DIAGNOSIS — R296 Repeated falls: Secondary | ICD-10-CM

## 2021-08-11 DIAGNOSIS — M79605 Pain in left leg: Secondary | ICD-10-CM | POA: Diagnosis not present
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
## 2021-08-13 ENCOUNTER — Encounter (HOSPITAL_BASED_OUTPATIENT_CLINIC_OR_DEPARTMENT_OTHER): Payer: Self-pay | Admitting: Physical Therapy

## 2021-08-13 ENCOUNTER — Ambulatory Visit (HOSPITAL_BASED_OUTPATIENT_CLINIC_OR_DEPARTMENT_OTHER): Payer: Medicare Other | Attending: Family Medicine | Admitting: Physical Therapy

## 2021-08-13 ENCOUNTER — Other Ambulatory Visit: Payer: Self-pay

## 2021-08-13 DIAGNOSIS — M25561 Pain in right knee: Secondary | ICD-10-CM | POA: Insufficient documentation

## 2021-08-13 DIAGNOSIS — R2689 Other abnormalities of gait and mobility: Secondary | ICD-10-CM | POA: Insufficient documentation

## 2021-08-13 DIAGNOSIS — M6281 Muscle weakness (generalized): Secondary | ICD-10-CM | POA: Insufficient documentation

## 2021-08-13 DIAGNOSIS — R293 Abnormal posture: Secondary | ICD-10-CM | POA: Insufficient documentation

## 2021-08-13 DIAGNOSIS — R262 Difficulty in walking, not elsewhere classified: Secondary | ICD-10-CM | POA: Diagnosis present

## 2021-08-13 DIAGNOSIS — G8929 Other chronic pain: Secondary | ICD-10-CM | POA: Diagnosis present

## 2021-08-13 DIAGNOSIS — R2681 Unsteadiness on feet: Secondary | ICD-10-CM | POA: Diagnosis present
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
## 2021-08-18 ENCOUNTER — Ambulatory Visit (HOSPITAL_BASED_OUTPATIENT_CLINIC_OR_DEPARTMENT_OTHER): Payer: Medicare Other | Admitting: Physical Therapy

## 2021-08-18 ENCOUNTER — Encounter (HOSPITAL_BASED_OUTPATIENT_CLINIC_OR_DEPARTMENT_OTHER): Payer: Self-pay | Admitting: Physical Therapy

## 2021-08-18 ENCOUNTER — Other Ambulatory Visit: Payer: Self-pay

## 2021-08-18 DIAGNOSIS — R2689 Other abnormalities of gait and mobility: Secondary | ICD-10-CM

## 2021-08-18 DIAGNOSIS — R262 Difficulty in walking, not elsewhere classified: Secondary | ICD-10-CM

## 2021-08-18 DIAGNOSIS — G8929 Other chronic pain: Secondary | ICD-10-CM

## 2021-08-18 DIAGNOSIS — R2681 Unsteadiness on feet: Secondary | ICD-10-CM

## 2021-08-18 DIAGNOSIS — R293 Abnormal posture: Secondary | ICD-10-CM

## 2021-08-18 DIAGNOSIS — M6281 Muscle weakness (generalized): Secondary | ICD-10-CM
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
## 2021-08-19 ENCOUNTER — Ambulatory Visit (HOSPITAL_BASED_OUTPATIENT_CLINIC_OR_DEPARTMENT_OTHER): Payer: Medicare Other | Admitting: Physical Therapy

## 2021-08-27 ENCOUNTER — Ambulatory Visit (HOSPITAL_BASED_OUTPATIENT_CLINIC_OR_DEPARTMENT_OTHER): Payer: Medicare Other | Admitting: Physical Therapy

## 2021-08-27 ENCOUNTER — Other Ambulatory Visit: Payer: Self-pay

## 2021-08-27 ENCOUNTER — Encounter (HOSPITAL_BASED_OUTPATIENT_CLINIC_OR_DEPARTMENT_OTHER): Payer: Self-pay | Admitting: Physical Therapy

## 2021-08-27 DIAGNOSIS — R293 Abnormal posture: Secondary | ICD-10-CM | POA: Diagnosis not present

## 2021-08-27 DIAGNOSIS — R2689 Other abnormalities of gait and mobility: Secondary | ICD-10-CM

## 2021-08-27 DIAGNOSIS — G8929 Other chronic pain: Secondary | ICD-10-CM

## 2021-08-27 DIAGNOSIS — R262 Difficulty in walking, not elsewhere classified: Secondary | ICD-10-CM

## 2021-08-27 DIAGNOSIS — R2681 Unsteadiness on feet: Secondary | ICD-10-CM

## 2021-08-27 DIAGNOSIS — M6281 Muscle weakness (generalized): Secondary | ICD-10-CM
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
## 2021-09-01 ENCOUNTER — Encounter (HOSPITAL_BASED_OUTPATIENT_CLINIC_OR_DEPARTMENT_OTHER): Payer: Self-pay | Admitting: Physical Therapy

## 2021-09-01 ENCOUNTER — Ambulatory Visit (HOSPITAL_BASED_OUTPATIENT_CLINIC_OR_DEPARTMENT_OTHER): Payer: Medicare Other | Admitting: Physical Therapy

## 2021-09-01 ENCOUNTER — Other Ambulatory Visit: Payer: Self-pay

## 2021-09-01 DIAGNOSIS — G8929 Other chronic pain: Secondary | ICD-10-CM

## 2021-09-01 DIAGNOSIS — M25561 Pain in right knee: Secondary | ICD-10-CM

## 2021-09-01 DIAGNOSIS — R293 Abnormal posture: Secondary | ICD-10-CM | POA: Diagnosis not present

## 2021-09-01 DIAGNOSIS — M6281 Muscle weakness (generalized): Secondary | ICD-10-CM

## 2021-09-01 DIAGNOSIS — R262 Difficulty in walking, not elsewhere classified: Secondary | ICD-10-CM

## 2021-09-01 DIAGNOSIS — R2681 Unsteadiness on feet: Secondary | ICD-10-CM

## 2021-09-01 DIAGNOSIS — R2689 Other abnormalities of gait and mobility: Secondary | ICD-10-CM
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
## 2021-09-03 ENCOUNTER — Ambulatory Visit (HOSPITAL_BASED_OUTPATIENT_CLINIC_OR_DEPARTMENT_OTHER): Payer: Self-pay | Admitting: Physical Therapy

## 2021-09-10 ENCOUNTER — Ambulatory Visit: Payer: Medicare Other | Attending: Student | Admitting: Physical Therapy

## 2021-09-10 VITALS — BP 150/90 | HR 83

## 2021-09-10 DIAGNOSIS — R262 Difficulty in walking, not elsewhere classified: Secondary | ICD-10-CM

## 2021-09-10 DIAGNOSIS — M6281 Muscle weakness (generalized): Secondary | ICD-10-CM | POA: Diagnosis present

## 2021-09-10 DIAGNOSIS — R296 Repeated falls: Secondary | ICD-10-CM

## 2021-09-10 DIAGNOSIS — M79605 Pain in left leg: Secondary | ICD-10-CM | POA: Diagnosis present
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
## 2021-09-11 ENCOUNTER — Telehealth: Payer: Self-pay | Admitting: Physical Therapy

## 2021-09-11 NOTE — Telephone Encounter (Signed)
Called patient back as requested. He states when he went to the podiatrist today and he debrided the wound on his foot. It went much deeper than expected and he has been told not to get in the pool or walk on it at least until 09/25/21. Patient wanted to update PT and ask if his cert can be extended after 12/15 so he can go to one more aquatic therapy appointment and pick up the HEP he forgot to bring home. Explained to patient I cannot extend it for just one more appointment when we are unsure when he will be able to get back in the pool, but he can go to pick up the HEP without an appointment. Patient agreed to cancel his appointment at the pool tomorrow. He understood PT recommends he get a new PT order if his function declines after he follows his restrictions for healing the wound on his foot.   Luretha Murphy. [name], PT, DPT 09/11/21, 1:19 PM

## 2021-09-12 ENCOUNTER — Ambulatory Visit (HOSPITAL_BASED_OUTPATIENT_CLINIC_OR_DEPARTMENT_OTHER): Payer: Medicare Other | Admitting: Physical Therapy

## 2021-09-25 ENCOUNTER — Encounter: Payer: Self-pay | Admitting: Physical Therapy

## 2021-09-25 DIAGNOSIS — M79605 Pain in left leg: Secondary | ICD-10-CM

## 2021-09-25 DIAGNOSIS — M6281 Muscle weakness (generalized): Secondary | ICD-10-CM

## 2021-09-25 DIAGNOSIS — R262 Difficulty in walking, not elsewhere classified: Secondary | ICD-10-CM

## 2021-09-25 DIAGNOSIS — R296 Repeated falls: Secondary | ICD-10-CM
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
## 2021-11-07 ENCOUNTER — Emergency Department: Payer: Medicare Other

## 2021-11-07 ENCOUNTER — Other Ambulatory Visit: Payer: Self-pay

## 2021-11-07 ENCOUNTER — Emergency Department
Admission: EM | Admit: 2021-11-07 | Discharge: 2021-11-08 | Disposition: A | Discharge status: Home / Self Care | Payer: Medicare Other | Attending: Emergency Medicine | Admitting: Emergency Medicine

## 2021-11-07 DIAGNOSIS — M25552 Pain in left hip: Secondary | ICD-10-CM | POA: Diagnosis not present

## 2021-11-07 DIAGNOSIS — M25562 Pain in left knee: Secondary | ICD-10-CM | POA: Insufficient documentation

## 2021-11-07 DIAGNOSIS — S0990XA Unspecified injury of head, initial encounter: Secondary | ICD-10-CM | POA: Diagnosis not present

## 2021-11-07 DIAGNOSIS — Y92410 Unspecified street and highway as the place of occurrence of the external cause: Secondary | ICD-10-CM | POA: Diagnosis not present

## 2021-11-07 MED ORDER — CYCLOBENZAPRINE HCL 5 MG PO TABS: 5.0000 mg | ORAL_TABLET | Freq: Three times a day (TID) | ORAL | 0 refills | Status: DC | PRN

## 2021-11-07 MED ORDER — CYCLOBENZAPRINE HCL 10 MG PO TABS
10.0000 mg | ORAL_TABLET | Freq: Once | ORAL | Status: AC
  Administered 2021-11-08: 10 mg via ORAL
  Filled 2021-11-07: qty 1
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
## 2021-11-07 NOTE — Discharge Instructions (Addendum)
Your exam, XRs, CTs are normal and reassuring . There are no signs of serious injury or hardware disruption. Follow-up with your PCP for ongoing symptoms.
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
## 2021-12-01 ENCOUNTER — Other Ambulatory Visit: Payer: Self-pay

## 2021-12-01 ENCOUNTER — Ambulatory Visit: Payer: Medicare Other | Attending: Student | Admitting: Physical Therapy

## 2021-12-01 DIAGNOSIS — M62838 Other muscle spasm: Secondary | ICD-10-CM | POA: Diagnosis present

## 2021-12-01 DIAGNOSIS — R519 Headache, unspecified: Secondary | ICD-10-CM | POA: Insufficient documentation

## 2021-12-01 DIAGNOSIS — R202 Paresthesia of skin: Secondary | ICD-10-CM | POA: Insufficient documentation

## 2021-12-01 DIAGNOSIS — M542 Cervicalgia: Secondary | ICD-10-CM | POA: Diagnosis not present

## 2021-12-01 DIAGNOSIS — M546 Pain in thoracic spine: Secondary | ICD-10-CM | POA: Insufficient documentation
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
## 2021-12-02 ENCOUNTER — Encounter: Payer: Self-pay | Admitting: Physical Therapy

## 2021-12-03 ENCOUNTER — Encounter: Payer: Self-pay | Admitting: Physical Therapy

## 2021-12-03 ENCOUNTER — Ambulatory Visit: Payer: Medicare Other | Admitting: Physical Therapy

## 2021-12-03 ENCOUNTER — Other Ambulatory Visit: Payer: Self-pay

## 2021-12-03 VITALS — BP 178/90 | HR 83

## 2021-12-03 DIAGNOSIS — M542 Cervicalgia: Secondary | ICD-10-CM | POA: Diagnosis not present

## 2021-12-03 DIAGNOSIS — R519 Headache, unspecified: Secondary | ICD-10-CM

## 2021-12-03 DIAGNOSIS — M546 Pain in thoracic spine: Secondary | ICD-10-CM

## 2021-12-03 DIAGNOSIS — M62838 Other muscle spasm: Secondary | ICD-10-CM

## 2021-12-03 DIAGNOSIS — R202 Paresthesia of skin: Secondary | ICD-10-CM
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
## 2021-12-04 ENCOUNTER — Telehealth: Payer: Self-pay | Admitting: Physical Therapy

## 2021-12-04 NOTE — Telephone Encounter (Signed)
Called patient back at his request and answered question he had about use of heat or ice for neck pain.   Luretha Murphy. [name], PT, DPT 12/04/21, 1:41 PM

## 2021-12-08 ENCOUNTER — Other Ambulatory Visit: Payer: Self-pay

## 2021-12-08 ENCOUNTER — Ambulatory Visit: Payer: Medicare Other | Admitting: Physical Therapy

## 2021-12-08 ENCOUNTER — Encounter: Payer: Self-pay | Admitting: Physical Therapy

## 2021-12-08 VITALS — BP 168/92 | HR 98

## 2021-12-08 DIAGNOSIS — M542 Cervicalgia: Secondary | ICD-10-CM

## 2021-12-08 DIAGNOSIS — M546 Pain in thoracic spine: Secondary | ICD-10-CM

## 2021-12-08 DIAGNOSIS — M62838 Other muscle spasm: Secondary | ICD-10-CM

## 2021-12-08 DIAGNOSIS — R202 Paresthesia of skin: Secondary | ICD-10-CM

## 2021-12-08 DIAGNOSIS — R519 Headache, unspecified: Secondary | ICD-10-CM
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
## 2021-12-10 ENCOUNTER — Encounter: Payer: Self-pay | Admitting: Physical Therapy

## 2021-12-10 ENCOUNTER — Ambulatory Visit: Payer: Medicare Other | Attending: Student | Admitting: Physical Therapy

## 2021-12-10 ENCOUNTER — Other Ambulatory Visit: Payer: Self-pay

## 2021-12-10 VITALS — BP 199/102 | HR 84

## 2021-12-10 DIAGNOSIS — M546 Pain in thoracic spine: Secondary | ICD-10-CM | POA: Diagnosis present

## 2021-12-10 DIAGNOSIS — M62838 Other muscle spasm: Secondary | ICD-10-CM | POA: Diagnosis present

## 2021-12-10 DIAGNOSIS — R202 Paresthesia of skin: Secondary | ICD-10-CM | POA: Insufficient documentation

## 2021-12-10 DIAGNOSIS — M542 Cervicalgia: Secondary | ICD-10-CM | POA: Diagnosis not present

## 2021-12-10 DIAGNOSIS — R519 Headache, unspecified: Secondary | ICD-10-CM | POA: Diagnosis present
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
## 2021-12-15 ENCOUNTER — Encounter: Payer: Self-pay | Admitting: Physical Therapy

## 2021-12-15 ENCOUNTER — Other Ambulatory Visit: Payer: Self-pay

## 2021-12-15 ENCOUNTER — Ambulatory Visit: Payer: Medicare Other | Admitting: Physical Therapy

## 2021-12-15 VITALS — BP 158/80 | HR 78

## 2021-12-15 DIAGNOSIS — M62838 Other muscle spasm: Secondary | ICD-10-CM

## 2021-12-15 DIAGNOSIS — M542 Cervicalgia: Secondary | ICD-10-CM | POA: Diagnosis not present

## 2021-12-15 DIAGNOSIS — M546 Pain in thoracic spine: Secondary | ICD-10-CM

## 2021-12-15 DIAGNOSIS — R519 Headache, unspecified: Secondary | ICD-10-CM

## 2021-12-15 DIAGNOSIS — R202 Paresthesia of skin: Secondary | ICD-10-CM
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
## 2021-12-17 ENCOUNTER — Ambulatory Visit: Payer: Medicare Other | Admitting: Physical Therapy

## 2021-12-17 ENCOUNTER — Other Ambulatory Visit: Payer: Self-pay

## 2021-12-17 ENCOUNTER — Encounter: Payer: Self-pay | Admitting: Physical Therapy

## 2021-12-17 VITALS — BP 159/79 | HR 91

## 2021-12-17 DIAGNOSIS — M542 Cervicalgia: Secondary | ICD-10-CM

## 2021-12-17 DIAGNOSIS — R519 Headache, unspecified: Secondary | ICD-10-CM

## 2021-12-17 DIAGNOSIS — M546 Pain in thoracic spine: Secondary | ICD-10-CM

## 2021-12-17 DIAGNOSIS — R202 Paresthesia of skin: Secondary | ICD-10-CM

## 2021-12-17 DIAGNOSIS — M62838 Other muscle spasm: Secondary | ICD-10-CM
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
## 2021-12-22 ENCOUNTER — Ambulatory Visit: Payer: Medicare Other | Admitting: Physical Therapy

## 2021-12-24 ENCOUNTER — Other Ambulatory Visit: Payer: Self-pay

## 2021-12-24 ENCOUNTER — Encounter: Payer: Self-pay | Admitting: Physical Therapy

## 2021-12-24 ENCOUNTER — Ambulatory Visit: Payer: Medicare Other | Admitting: Physical Therapy

## 2021-12-24 VITALS — BP 165/84 | HR 81

## 2021-12-24 DIAGNOSIS — R519 Headache, unspecified: Secondary | ICD-10-CM

## 2021-12-24 DIAGNOSIS — M542 Cervicalgia: Secondary | ICD-10-CM | POA: Diagnosis not present

## 2021-12-24 DIAGNOSIS — M546 Pain in thoracic spine: Secondary | ICD-10-CM

## 2021-12-24 DIAGNOSIS — R202 Paresthesia of skin: Secondary | ICD-10-CM

## 2021-12-24 DIAGNOSIS — M62838 Other muscle spasm: Secondary | ICD-10-CM
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
## 2021-12-29 ENCOUNTER — Ambulatory Visit: Payer: Medicare Other | Admitting: Physical Therapy

## 2021-12-29 ENCOUNTER — Encounter: Payer: Self-pay | Admitting: Physical Therapy

## 2021-12-29 ENCOUNTER — Other Ambulatory Visit: Payer: Self-pay

## 2021-12-29 VITALS — BP 140/65 | HR 107

## 2021-12-29 DIAGNOSIS — M542 Cervicalgia: Secondary | ICD-10-CM

## 2021-12-29 DIAGNOSIS — M62838 Other muscle spasm: Secondary | ICD-10-CM

## 2021-12-29 DIAGNOSIS — R519 Headache, unspecified: Secondary | ICD-10-CM

## 2021-12-29 DIAGNOSIS — M546 Pain in thoracic spine: Secondary | ICD-10-CM

## 2021-12-29 DIAGNOSIS — R202 Paresthesia of skin: Secondary | ICD-10-CM
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
## 2021-12-31 ENCOUNTER — Encounter: Payer: Self-pay | Admitting: Physical Therapy

## 2021-12-31 ENCOUNTER — Ambulatory Visit: Payer: Medicare Other | Admitting: Physical Therapy

## 2021-12-31 ENCOUNTER — Other Ambulatory Visit: Payer: Self-pay

## 2021-12-31 VITALS — BP 157/88 | HR 91

## 2021-12-31 DIAGNOSIS — R202 Paresthesia of skin: Secondary | ICD-10-CM

## 2021-12-31 DIAGNOSIS — M542 Cervicalgia: Secondary | ICD-10-CM

## 2021-12-31 DIAGNOSIS — M62838 Other muscle spasm: Secondary | ICD-10-CM

## 2021-12-31 DIAGNOSIS — R519 Headache, unspecified: Secondary | ICD-10-CM

## 2021-12-31 DIAGNOSIS — M546 Pain in thoracic spine: Secondary | ICD-10-CM
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
## 2022-01-01 ENCOUNTER — Other Ambulatory Visit (INDEPENDENT_AMBULATORY_CARE_PROVIDER_SITE_OTHER): Payer: Self-pay | Admitting: Nurse Practitioner

## 2022-01-01 ENCOUNTER — Other Ambulatory Visit (INDEPENDENT_AMBULATORY_CARE_PROVIDER_SITE_OTHER): Payer: Self-pay

## 2022-01-01 DIAGNOSIS — L97529 Non-pressure chronic ulcer of other part of left foot with unspecified severity: Secondary | ICD-10-CM

## 2022-01-02 ENCOUNTER — Ambulatory Visit (INDEPENDENT_AMBULATORY_CARE_PROVIDER_SITE_OTHER): Payer: Medicare Other | Admitting: Nurse Practitioner

## 2022-01-02 ENCOUNTER — Encounter (INDEPENDENT_AMBULATORY_CARE_PROVIDER_SITE_OTHER): Payer: Self-pay | Admitting: Nurse Practitioner

## 2022-01-02 ENCOUNTER — Other Ambulatory Visit: Payer: Self-pay

## 2022-01-02 ENCOUNTER — Ambulatory Visit (INDEPENDENT_AMBULATORY_CARE_PROVIDER_SITE_OTHER): Payer: Medicare Other

## 2022-01-02 VITALS — BP 173/99 | HR 88 | Resp 16 | Ht 70.0 in | Wt 256.0 lb

## 2022-01-02 DIAGNOSIS — I10 Essential (primary) hypertension: Secondary | ICD-10-CM

## 2022-01-02 DIAGNOSIS — E1151 Type 2 diabetes mellitus with diabetic peripheral angiopathy without gangrene: Secondary | ICD-10-CM | POA: Diagnosis not present

## 2022-01-02 DIAGNOSIS — L97529 Non-pressure chronic ulcer of other part of left foot with unspecified severity: Secondary | ICD-10-CM | POA: Diagnosis not present

## 2022-01-02 DIAGNOSIS — Z794 Long term (current) use of insulin: Secondary | ICD-10-CM | POA: Diagnosis not present

## 2022-01-02 DIAGNOSIS — I739 Peripheral vascular disease, unspecified: Secondary | ICD-10-CM

## 2022-01-05 ENCOUNTER — Encounter (INDEPENDENT_AMBULATORY_CARE_PROVIDER_SITE_OTHER): Payer: Self-pay | Admitting: Nurse Practitioner

## 2022-01-05 ENCOUNTER — Other Ambulatory Visit: Payer: Self-pay

## 2022-01-05 ENCOUNTER — Encounter: Payer: Self-pay | Admitting: Physical Therapy

## 2022-01-05 ENCOUNTER — Ambulatory Visit: Payer: Medicare Other | Admitting: Physical Therapy

## 2022-01-05 VITALS — BP 144/76 | HR 88

## 2022-01-05 DIAGNOSIS — M62838 Other muscle spasm: Secondary | ICD-10-CM

## 2022-01-05 DIAGNOSIS — R202 Paresthesia of skin: Secondary | ICD-10-CM

## 2022-01-05 DIAGNOSIS — R519 Headache, unspecified: Secondary | ICD-10-CM

## 2022-01-05 DIAGNOSIS — M546 Pain in thoracic spine: Secondary | ICD-10-CM

## 2022-01-05 DIAGNOSIS — M542 Cervicalgia: Secondary | ICD-10-CM | POA: Diagnosis not present
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
## 2022-01-06 ENCOUNTER — Encounter: Payer: Self-pay | Admitting: Physical Therapy

## 2022-01-06 ENCOUNTER — Ambulatory Visit: Payer: Medicare Other | Admitting: Physical Therapy

## 2022-01-06 VITALS — BP 142/74 | HR 89

## 2022-01-06 DIAGNOSIS — M546 Pain in thoracic spine: Secondary | ICD-10-CM

## 2022-01-06 DIAGNOSIS — M542 Cervicalgia: Secondary | ICD-10-CM | POA: Diagnosis not present

## 2022-01-06 DIAGNOSIS — R202 Paresthesia of skin: Secondary | ICD-10-CM

## 2022-01-06 DIAGNOSIS — R519 Headache, unspecified: Secondary | ICD-10-CM

## 2022-01-06 DIAGNOSIS — M62838 Other muscle spasm: Secondary | ICD-10-CM
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
## 2022-01-07 ENCOUNTER — Encounter: Payer: Medicare Other | Admitting: Physical Therapy

## 2022-01-12 ENCOUNTER — Encounter: Payer: Self-pay | Admitting: Physical Therapy

## 2022-01-12 ENCOUNTER — Ambulatory Visit: Payer: Medicare Other | Attending: Student | Admitting: Physical Therapy

## 2022-01-12 VITALS — BP 173/90 | HR 103

## 2022-01-12 DIAGNOSIS — M62838 Other muscle spasm: Secondary | ICD-10-CM | POA: Insufficient documentation

## 2022-01-12 DIAGNOSIS — R519 Headache, unspecified: Secondary | ICD-10-CM | POA: Insufficient documentation

## 2022-01-12 DIAGNOSIS — M542 Cervicalgia: Secondary | ICD-10-CM | POA: Insufficient documentation

## 2022-01-12 DIAGNOSIS — R202 Paresthesia of skin: Secondary | ICD-10-CM | POA: Diagnosis present

## 2022-01-12 DIAGNOSIS — M546 Pain in thoracic spine: Secondary | ICD-10-CM | POA: Insufficient documentation
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
## 2022-01-14 ENCOUNTER — Ambulatory Visit: Payer: Medicare Other | Admitting: Physical Therapy

## 2022-01-14 ENCOUNTER — Encounter: Payer: Self-pay | Admitting: Physical Therapy

## 2022-01-14 VITALS — BP 137/54 | HR 85

## 2022-01-14 DIAGNOSIS — R519 Headache, unspecified: Secondary | ICD-10-CM

## 2022-01-14 DIAGNOSIS — R202 Paresthesia of skin: Secondary | ICD-10-CM

## 2022-01-14 DIAGNOSIS — M542 Cervicalgia: Secondary | ICD-10-CM

## 2022-01-14 DIAGNOSIS — M546 Pain in thoracic spine: Secondary | ICD-10-CM

## 2022-01-14 DIAGNOSIS — M62838 Other muscle spasm: Secondary | ICD-10-CM
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
## 2022-01-19 ENCOUNTER — Encounter: Payer: Medicare Other | Admitting: Physical Therapy

## 2022-01-21 ENCOUNTER — Ambulatory Visit: Payer: Medicare Other | Admitting: Physical Therapy

## 2022-01-21 ENCOUNTER — Telehealth: Payer: Self-pay | Admitting: Physical Therapy

## 2022-01-21 NOTE — Telephone Encounter (Signed)
Called patient when he did not show up for his appointment. He answered and was apologetic and frustrated, realizing he got the time mixed up by looking at a card for his daughter, Evelina Bucy PT eval on another day. He was planning to let her have his appointment slot today. Patient rescheduled Olivia's PT Eval for tomorrow at 10:30am.  ? ?Everlean Alstrom. Graylon Good, PT, DPT ?01/21/22, 9:13 AM ? ?[place] ?[address] ?[place], [place] [address] ?PPV:[phone] I F: [phone] ? ?

## 2022-01-27 ENCOUNTER — Ambulatory Visit: Payer: Medicare Other | Admitting: Physical Therapy

## 2022-01-27 ENCOUNTER — Encounter: Payer: Self-pay | Admitting: Physical Therapy

## 2022-01-27 VITALS — BP 145/73 | HR 93

## 2022-01-27 DIAGNOSIS — R519 Headache, unspecified: Secondary | ICD-10-CM

## 2022-01-27 DIAGNOSIS — M546 Pain in thoracic spine: Secondary | ICD-10-CM

## 2022-01-27 DIAGNOSIS — R202 Paresthesia of skin: Secondary | ICD-10-CM

## 2022-01-27 DIAGNOSIS — M542 Cervicalgia: Secondary | ICD-10-CM | POA: Diagnosis not present

## 2022-01-27 DIAGNOSIS — M62838 Other muscle spasm: Secondary | ICD-10-CM
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
## 2022-01-28 ENCOUNTER — Encounter: Payer: Self-pay | Admitting: Physical Therapy

## 2022-01-28 ENCOUNTER — Ambulatory Visit: Payer: Medicare Other | Admitting: Physical Therapy

## 2022-01-28 VITALS — BP 155/90 | HR 94

## 2022-01-28 DIAGNOSIS — R202 Paresthesia of skin: Secondary | ICD-10-CM

## 2022-01-28 DIAGNOSIS — M546 Pain in thoracic spine: Secondary | ICD-10-CM

## 2022-01-28 DIAGNOSIS — R519 Headache, unspecified: Secondary | ICD-10-CM

## 2022-01-28 DIAGNOSIS — M542 Cervicalgia: Secondary | ICD-10-CM | POA: Diagnosis not present

## 2022-01-28 DIAGNOSIS — M62838 Other muscle spasm: Secondary | ICD-10-CM
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
## 2022-02-02 ENCOUNTER — Ambulatory Visit: Payer: Medicare Other | Admitting: Physical Therapy

## 2022-02-02 ENCOUNTER — Encounter: Payer: Self-pay | Admitting: Physical Therapy

## 2022-02-02 VITALS — BP 155/81 | HR 85

## 2022-02-02 DIAGNOSIS — M62838 Other muscle spasm: Secondary | ICD-10-CM

## 2022-02-02 DIAGNOSIS — R519 Headache, unspecified: Secondary | ICD-10-CM

## 2022-02-02 DIAGNOSIS — M542 Cervicalgia: Secondary | ICD-10-CM

## 2022-02-02 DIAGNOSIS — R202 Paresthesia of skin: Secondary | ICD-10-CM

## 2022-02-02 DIAGNOSIS — M546 Pain in thoracic spine: Secondary | ICD-10-CM
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
## 2022-02-04 ENCOUNTER — Ambulatory Visit: Payer: Medicare Other | Admitting: Physical Therapy

## 2022-02-04 ENCOUNTER — Encounter: Payer: Self-pay | Admitting: Physical Therapy

## 2022-02-04 VITALS — BP 118/80 | HR 86

## 2022-02-04 DIAGNOSIS — M546 Pain in thoracic spine: Secondary | ICD-10-CM

## 2022-02-04 DIAGNOSIS — M62838 Other muscle spasm: Secondary | ICD-10-CM

## 2022-02-04 DIAGNOSIS — R519 Headache, unspecified: Secondary | ICD-10-CM

## 2022-02-04 DIAGNOSIS — M542 Cervicalgia: Secondary | ICD-10-CM | POA: Diagnosis not present

## 2022-02-04 DIAGNOSIS — R202 Paresthesia of skin: Secondary | ICD-10-CM
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
## 2022-02-10 ENCOUNTER — Ambulatory Visit: Payer: Medicare Other | Attending: Student | Admitting: Physical Therapy

## 2022-02-10 ENCOUNTER — Encounter: Payer: Self-pay | Admitting: Physical Therapy

## 2022-02-10 VITALS — BP 124/67 | HR 81

## 2022-02-10 DIAGNOSIS — M62838 Other muscle spasm: Secondary | ICD-10-CM | POA: Diagnosis present

## 2022-02-10 DIAGNOSIS — R202 Paresthesia of skin: Secondary | ICD-10-CM | POA: Diagnosis present

## 2022-02-10 DIAGNOSIS — R519 Headache, unspecified: Secondary | ICD-10-CM | POA: Insufficient documentation

## 2022-02-10 DIAGNOSIS — M546 Pain in thoracic spine: Secondary | ICD-10-CM | POA: Diagnosis present

## 2022-02-10 DIAGNOSIS — M542 Cervicalgia: Secondary | ICD-10-CM | POA: Diagnosis present
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
## 2022-02-12 ENCOUNTER — Ambulatory Visit: Payer: Medicare Other | Admitting: Physical Therapy

## 2022-02-12 ENCOUNTER — Encounter: Payer: Self-pay | Admitting: Physical Therapy

## 2022-02-12 VITALS — BP 114/71 | HR 89

## 2022-02-12 DIAGNOSIS — M546 Pain in thoracic spine: Secondary | ICD-10-CM

## 2022-02-12 DIAGNOSIS — R519 Headache, unspecified: Secondary | ICD-10-CM

## 2022-02-12 DIAGNOSIS — M542 Cervicalgia: Secondary | ICD-10-CM | POA: Diagnosis not present

## 2022-02-12 DIAGNOSIS — M62838 Other muscle spasm: Secondary | ICD-10-CM

## 2022-02-12 DIAGNOSIS — R202 Paresthesia of skin: Secondary | ICD-10-CM

## 2022-02-19 ENCOUNTER — Telehealth: Payer: Self-pay | Admitting: Physical Therapy

## 2022-02-19 NOTE — Telephone Encounter (Signed)
Called patient back as requested. He said he saw the podiatrist who said the wound on his foot is looking better and he expects patient to be able to start back in aquatic therapy after returning to podiatrist in 3 weeks. Patient stated his doctor wrote the referral for aquatic therapy so it can get scheduled since there can be a wait to get on the schedule. Patient stated he would bring it by hopefully by early next week. Patient states podiatrist said to bring it to PT and that if PT needed anything different for PT to just ask him.  ? ?Everlean Alstrom. Graylon Good, PT, DPT ?02/19/22, 12:22 PM ? ?[facility] ?[address] ?[place], [place] [address] ?PPV:[id number] I F: [phone] ? ?

## 2022-02-20 ENCOUNTER — Encounter: Payer: Self-pay | Admitting: Family Medicine

## 2022-02-20 ENCOUNTER — Ambulatory Visit (INDEPENDENT_AMBULATORY_CARE_PROVIDER_SITE_OTHER): Payer: Medicare Other | Admitting: Family Medicine

## 2022-02-20 VITALS — BP 138/84 | HR 87 | Ht 70.0 in | Wt 252.0 lb

## 2022-02-20 DIAGNOSIS — I739 Peripheral vascular disease, unspecified: Secondary | ICD-10-CM

## 2022-02-20 DIAGNOSIS — I10 Essential (primary) hypertension: Secondary | ICD-10-CM | POA: Diagnosis not present

## 2022-02-20 DIAGNOSIS — E1169 Type 2 diabetes mellitus with other specified complication: Secondary | ICD-10-CM | POA: Diagnosis not present

## 2022-02-20 DIAGNOSIS — E1142 Type 2 diabetes mellitus with diabetic polyneuropathy: Secondary | ICD-10-CM

## 2022-02-20 DIAGNOSIS — E785 Hyperlipidemia, unspecified: Secondary | ICD-10-CM

## 2022-02-20 DIAGNOSIS — Z794 Long term (current) use of insulin: Secondary | ICD-10-CM

## 2022-02-20 MED ORDER — LOVASTATIN 40 MG PO TABS: 40.0000 mg | ORAL_TABLET | Freq: Every day | ORAL | 3 refills | Status: DC

## 2022-02-20 NOTE — Assessment & Plan Note (Signed)
Well-controlled HTN ?No known complications  ?  ?Plan:  ?1.  Continue current BP regimen Losartan 100mg  daily, Metoprolol XL 25mg  daily ?2. Encourage improved lifestyle - low sodium diet, regular exercise ?3. Continue monitor BP outside office, bring readings to next visit, if persistently >140/90 or new symptoms notify office sooner ?

## 2022-02-20 NOTE — Patient Instructions (Addendum)
Thank you for coming to the office today. ? ?Try to keep consistency with the medications for sugar and BP ? ?Check dose of Losartan, should be 100mg  daily. Should keep same dose if doing well ? ?Labs next week ? ?DUE for FASTING BLOOD WORK (no food or drink after midnight before the lab appointment, only water or coffee without cream/sugar on the morning of) ? ?SCHEDULE "Lab Only" visit in the morning at the clinic for lab draw in Monday AM ? ?- Make sure Lab Only appointment is at about 1 week before your next appointment, so that results will be available ? ?For Lab Results, once available within 2-3 days of blood draw, you can can log in to MyChart online to view your results and a brief explanation. Also, we can discuss results at next follow-up visit. ? ? ?Please schedule a Follow-up Appointment to: Return for fasting lab next week Monday if possible AM, will follow-up 3-4 month DM, HTN updates. ? ?If you have any other questions or concerns, please feel free to call the office or send a message through MyChart. You may also schedule an earlier appointment if necessary. ? ?Additionally, you may be receiving a survey about your experience at our office within a few days to 1 week by e-mail or mail. We value your feedback. ? ?Wednesday, DO ?[facility], [facility] ?

## 2022-02-20 NOTE — Assessment & Plan Note (Signed)
Managed by [facility] ?Previously controlled, due for A1c lab ?Complications - peripheral neuropathy, other including hyperlipidemia ? ?Plan:  ?1. Continue current therapy - Lantus 40 u daily, mealtime insulin 20u BID, metformin 500 daily, no longer on farxiga ?2. Encourage improved lifestyle - low carb, low sugar diet, reduce portion size, continue improving regular exercise ?3. CGM monitor ?4. Continue On ARB. Future if lipids still elevated discuss restarting Statin therapy Lovastatin 40 ordered ?5. Future DM Foot / Urine update microalbumin ?

## 2022-02-20 NOTE — Assessment & Plan Note (Signed)
Followed by Vascular specialist. 

## 2022-02-20 NOTE — Assessment & Plan Note (Signed)
Secondary to DM ?Complication ?

## 2022-02-20 NOTE — Assessment & Plan Note (Signed)
Check lipids  ?Follow up resume lovastatin 40mg  if indicated ?

## 2022-02-20 NOTE — Assessment & Plan Note (Signed)
BMI >36 with comorbid conditions DM2, HLD ?
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
## 2022-02-23 ENCOUNTER — Other Ambulatory Visit: Payer: Medicare Other

## 2022-02-23 DIAGNOSIS — E1169 Type 2 diabetes mellitus with other specified complication: Secondary | ICD-10-CM

## 2022-02-23 DIAGNOSIS — I10 Essential (primary) hypertension: Secondary | ICD-10-CM

## 2022-02-24 LAB — COMPLETE METABOLIC PANEL WITH GFR
AG Ratio: 1.2 (calc) (ref 1.0–2.5)
ALT: 27 U/L (ref 9–46)
AST: 22 U/L (ref 10–35)
Albumin: 3.9 g/dL (ref 3.6–5.1)
Alkaline phosphatase (APISO): 80 U/L (ref 35–144)
BUN/Creatinine Ratio: 37 (calc) — ABNORMAL HIGH (ref 6–22)
BUN: 37 mg/dL — ABNORMAL HIGH (ref 7–25)
CO2: 22 mmol/L (ref 20–32)
Calcium: 10.1 mg/dL (ref 8.6–10.3)
Chloride: 110 mmol/L (ref 98–110)
Creat: 1 mg/dL (ref 0.70–1.30)
Globulin: 3.2 g/dL (calc) (ref 1.9–3.7)
Glucose, Bld: 135 mg/dL — ABNORMAL HIGH (ref 65–99)
Potassium: 4.5 mmol/L (ref 3.5–5.3)
Sodium: 142 mmol/L (ref 135–146)
Total Bilirubin: 0.3 mg/dL (ref 0.2–1.2)
Total Protein: 7.1 g/dL (ref 6.1–8.1)
eGFR: 89 mL/min/{1.73_m2} (ref >=60)

## 2022-02-24 LAB — LIPID PANEL
Cholesterol: 174 mg/dL (ref <200)
HDL: 47 mg/dL (ref >=40)
LDL Cholesterol (Calc): 109 mg/dL (calc) — ABNORMAL HIGH
Non-HDL Cholesterol (Calc): 127 mg/dL (calc) (ref <130)
Total CHOL/HDL Ratio: 3.7 (calc) (ref <5.0)
Triglycerides: 86 mg/dL (ref <150)

## 2022-02-24 LAB — HEMOGLOBIN A1C
Hgb A1c MFr Bld: 10.2 % of total Hgb — ABNORMAL HIGH (ref <5.7)
Mean Plasma Glucose: 246 mg/dL
eAG (mmol/L): 13.6 mmol/L

## 2022-03-12 DIAGNOSIS — E1140 Type 2 diabetes mellitus with diabetic neuropathy, unspecified: Secondary | ICD-10-CM | POA: Diagnosis not present

## 2022-03-12 DIAGNOSIS — L97521 Non-pressure chronic ulcer of other part of left foot limited to breakdown of skin: Secondary | ICD-10-CM | POA: Diagnosis not present

## 2022-03-12 DIAGNOSIS — Z794 Long term (current) use of insulin: Secondary | ICD-10-CM | POA: Diagnosis not present

## 2022-03-17 ENCOUNTER — Ambulatory Visit: Payer: Medicare Other | Admitting: Physical Therapy

## 2022-03-21 DIAGNOSIS — E1165 Type 2 diabetes mellitus with hyperglycemia: Secondary | ICD-10-CM | POA: Diagnosis not present
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
## 2022-04-01 ENCOUNTER — Encounter: Payer: Self-pay | Admitting: Physical Therapy

## 2022-04-01 ENCOUNTER — Ambulatory Visit: Payer: Medicare Other | Attending: Student | Admitting: Physical Therapy

## 2022-04-01 VITALS — BP 152/73 | HR 92

## 2022-04-01 DIAGNOSIS — M6281 Muscle weakness (generalized): Secondary | ICD-10-CM | POA: Diagnosis not present

## 2022-04-01 DIAGNOSIS — R262 Difficulty in walking, not elsewhere classified: Secondary | ICD-10-CM | POA: Insufficient documentation

## 2022-04-01 DIAGNOSIS — Z9181 History of falling: Secondary | ICD-10-CM | POA: Insufficient documentation

## 2022-04-01 DIAGNOSIS — R2681 Unsteadiness on feet: Secondary | ICD-10-CM | POA: Diagnosis not present

## 2022-04-02 DIAGNOSIS — L97521 Non-pressure chronic ulcer of other part of left foot limited to breakdown of skin: Secondary | ICD-10-CM | POA: Diagnosis not present

## 2022-04-06 ENCOUNTER — Ambulatory Visit (HOSPITAL_BASED_OUTPATIENT_CLINIC_OR_DEPARTMENT_OTHER): Payer: Medicare Other | Attending: Orthopedic Surgery | Admitting: Physical Therapy

## 2022-04-06 ENCOUNTER — Encounter (HOSPITAL_BASED_OUTPATIENT_CLINIC_OR_DEPARTMENT_OTHER): Payer: Self-pay | Admitting: Physical Therapy

## 2022-04-06 DIAGNOSIS — Z9181 History of falling: Secondary | ICD-10-CM | POA: Diagnosis not present

## 2022-04-06 DIAGNOSIS — R2681 Unsteadiness on feet: Secondary | ICD-10-CM | POA: Diagnosis not present

## 2022-04-06 DIAGNOSIS — M542 Cervicalgia: Secondary | ICD-10-CM

## 2022-04-06 DIAGNOSIS — M6281 Muscle weakness (generalized): Secondary | ICD-10-CM | POA: Diagnosis not present

## 2022-04-06 DIAGNOSIS — R262 Difficulty in walking, not elsewhere classified: Secondary | ICD-10-CM | POA: Diagnosis not present

## 2022-04-10 ENCOUNTER — Encounter (HOSPITAL_BASED_OUTPATIENT_CLINIC_OR_DEPARTMENT_OTHER): Payer: Self-pay | Admitting: Physical Therapy

## 2022-04-10 ENCOUNTER — Ambulatory Visit (HOSPITAL_BASED_OUTPATIENT_CLINIC_OR_DEPARTMENT_OTHER): Payer: Medicare Other | Admitting: Physical Therapy

## 2022-04-10 DIAGNOSIS — R2681 Unsteadiness on feet: Secondary | ICD-10-CM

## 2022-04-10 DIAGNOSIS — Z9181 History of falling: Secondary | ICD-10-CM | POA: Diagnosis not present

## 2022-04-10 DIAGNOSIS — M6281 Muscle weakness (generalized): Secondary | ICD-10-CM

## 2022-04-10 DIAGNOSIS — M542 Cervicalgia: Secondary | ICD-10-CM | POA: Diagnosis not present

## 2022-04-10 DIAGNOSIS — R262 Difficulty in walking, not elsewhere classified: Secondary | ICD-10-CM | POA: Diagnosis not present
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
## 2022-04-13 ENCOUNTER — Ambulatory Visit (HOSPITAL_BASED_OUTPATIENT_CLINIC_OR_DEPARTMENT_OTHER): Payer: Medicare Other | Admitting: Physical Therapy

## 2022-04-16 ENCOUNTER — Encounter (HOSPITAL_BASED_OUTPATIENT_CLINIC_OR_DEPARTMENT_OTHER): Payer: Self-pay | Admitting: Physical Therapy

## 2022-04-16 ENCOUNTER — Ambulatory Visit (HOSPITAL_BASED_OUTPATIENT_CLINIC_OR_DEPARTMENT_OTHER): Payer: Medicare Other | Attending: Orthopedic Surgery | Admitting: Physical Therapy

## 2022-04-16 DIAGNOSIS — R2681 Unsteadiness on feet: Secondary | ICD-10-CM

## 2022-04-16 DIAGNOSIS — R262 Difficulty in walking, not elsewhere classified: Secondary | ICD-10-CM | POA: Diagnosis not present

## 2022-04-16 DIAGNOSIS — M6281 Muscle weakness (generalized): Secondary | ICD-10-CM

## 2022-04-16 DIAGNOSIS — Z9181 History of falling: Secondary | ICD-10-CM
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
## 2022-04-20 DIAGNOSIS — E1165 Type 2 diabetes mellitus with hyperglycemia: Secondary | ICD-10-CM | POA: Diagnosis not present

## 2022-04-28 ENCOUNTER — Ambulatory Visit: Payer: Medicare Other | Attending: Student | Admitting: Physical Therapy

## 2022-04-28 ENCOUNTER — Encounter: Payer: Self-pay | Admitting: Physical Therapy

## 2022-04-28 VITALS — BP 151/86 | HR 84 | Wt 228.8 lb

## 2022-04-28 DIAGNOSIS — R262 Difficulty in walking, not elsewhere classified: Secondary | ICD-10-CM | POA: Diagnosis not present

## 2022-04-28 DIAGNOSIS — R2681 Unsteadiness on feet: Secondary | ICD-10-CM | POA: Diagnosis not present

## 2022-04-28 DIAGNOSIS — Z9181 History of falling: Secondary | ICD-10-CM | POA: Diagnosis not present

## 2022-04-28 DIAGNOSIS — M6281 Muscle weakness (generalized): Secondary | ICD-10-CM | POA: Insufficient documentation
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
## 2022-04-30 ENCOUNTER — Ambulatory Visit (HOSPITAL_BASED_OUTPATIENT_CLINIC_OR_DEPARTMENT_OTHER): Payer: Medicare Other | Admitting: Physical Therapy

## 2022-04-30 ENCOUNTER — Encounter (HOSPITAL_BASED_OUTPATIENT_CLINIC_OR_DEPARTMENT_OTHER): Payer: Self-pay | Admitting: Physical Therapy

## 2022-04-30 DIAGNOSIS — Z9181 History of falling: Secondary | ICD-10-CM | POA: Diagnosis not present

## 2022-04-30 DIAGNOSIS — R262 Difficulty in walking, not elsewhere classified: Secondary | ICD-10-CM

## 2022-04-30 DIAGNOSIS — R2681 Unsteadiness on feet: Secondary | ICD-10-CM

## 2022-04-30 DIAGNOSIS — M6281 Muscle weakness (generalized): Secondary | ICD-10-CM | POA: Diagnosis not present
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
## 2022-05-05 ENCOUNTER — Ambulatory Visit (HOSPITAL_BASED_OUTPATIENT_CLINIC_OR_DEPARTMENT_OTHER): Payer: Medicare Other | Admitting: Physical Therapy

## 2022-05-05 ENCOUNTER — Encounter (HOSPITAL_BASED_OUTPATIENT_CLINIC_OR_DEPARTMENT_OTHER): Payer: Self-pay | Admitting: Physical Therapy

## 2022-05-05 DIAGNOSIS — R2681 Unsteadiness on feet: Secondary | ICD-10-CM

## 2022-05-05 DIAGNOSIS — M6281 Muscle weakness (generalized): Secondary | ICD-10-CM

## 2022-05-05 DIAGNOSIS — R262 Difficulty in walking, not elsewhere classified: Secondary | ICD-10-CM

## 2022-05-05 DIAGNOSIS — Z9181 History of falling: Secondary | ICD-10-CM | POA: Diagnosis not present

## 2022-05-05 DIAGNOSIS — L97511 Non-pressure chronic ulcer of other part of right foot limited to breakdown of skin: Secondary | ICD-10-CM | POA: Diagnosis not present

## 2022-05-05 DIAGNOSIS — E1140 Type 2 diabetes mellitus with diabetic neuropathy, unspecified: Secondary | ICD-10-CM | POA: Diagnosis not present

## 2022-05-05 DIAGNOSIS — L97521 Non-pressure chronic ulcer of other part of left foot limited to breakdown of skin: Secondary | ICD-10-CM | POA: Diagnosis not present

## 2022-05-05 DIAGNOSIS — M21371 Foot drop, right foot: Secondary | ICD-10-CM | POA: Diagnosis not present

## 2022-05-05 DIAGNOSIS — Z794 Long term (current) use of insulin: Secondary | ICD-10-CM | POA: Diagnosis not present
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
## 2022-05-08 ENCOUNTER — Ambulatory Visit (HOSPITAL_BASED_OUTPATIENT_CLINIC_OR_DEPARTMENT_OTHER): Payer: Medicare Other | Admitting: Physical Therapy

## 2022-05-08 ENCOUNTER — Encounter (HOSPITAL_BASED_OUTPATIENT_CLINIC_OR_DEPARTMENT_OTHER): Payer: Self-pay | Admitting: Physical Therapy

## 2022-05-08 DIAGNOSIS — M6281 Muscle weakness (generalized): Secondary | ICD-10-CM

## 2022-05-08 DIAGNOSIS — R262 Difficulty in walking, not elsewhere classified: Secondary | ICD-10-CM | POA: Diagnosis not present

## 2022-05-08 DIAGNOSIS — R2681 Unsteadiness on feet: Secondary | ICD-10-CM

## 2022-05-08 DIAGNOSIS — Z9181 History of falling: Secondary | ICD-10-CM
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
## 2022-05-11 ENCOUNTER — Encounter (HOSPITAL_BASED_OUTPATIENT_CLINIC_OR_DEPARTMENT_OTHER): Payer: Self-pay | Admitting: Physical Therapy

## 2022-05-11 ENCOUNTER — Ambulatory Visit (HOSPITAL_BASED_OUTPATIENT_CLINIC_OR_DEPARTMENT_OTHER): Payer: Medicare Other | Admitting: Physical Therapy

## 2022-05-11 DIAGNOSIS — Z9181 History of falling: Secondary | ICD-10-CM

## 2022-05-11 DIAGNOSIS — R2681 Unsteadiness on feet: Secondary | ICD-10-CM

## 2022-05-11 DIAGNOSIS — M6281 Muscle weakness (generalized): Secondary | ICD-10-CM | POA: Diagnosis not present

## 2022-05-11 DIAGNOSIS — R262 Difficulty in walking, not elsewhere classified: Secondary | ICD-10-CM | POA: Diagnosis not present
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
## 2022-05-13 ENCOUNTER — Ambulatory Visit (INDEPENDENT_AMBULATORY_CARE_PROVIDER_SITE_OTHER): Payer: Medicare Other | Admitting: Family Medicine

## 2022-05-13 ENCOUNTER — Ambulatory Visit: Payer: Medicare Other | Attending: Student | Admitting: Physical Therapy

## 2022-05-13 ENCOUNTER — Encounter: Payer: Self-pay | Admitting: Physical Therapy

## 2022-05-13 ENCOUNTER — Ambulatory Visit: Payer: Self-pay

## 2022-05-13 ENCOUNTER — Encounter: Payer: Self-pay | Admitting: Family Medicine

## 2022-05-13 VITALS — BP 143/81 | HR 90 | Ht 70.0 in | Wt 228.6 lb

## 2022-05-13 VITALS — BP 147/87 | HR 84 | Temp 98.1°F | Wt 225.0 lb

## 2022-05-13 DIAGNOSIS — E11622 Type 2 diabetes mellitus with other skin ulcer: Secondary | ICD-10-CM

## 2022-05-13 DIAGNOSIS — L03116 Cellulitis of left lower limb: Secondary | ICD-10-CM | POA: Diagnosis not present

## 2022-05-13 DIAGNOSIS — R262 Difficulty in walking, not elsewhere classified: Secondary | ICD-10-CM

## 2022-05-13 DIAGNOSIS — M6281 Muscle weakness (generalized): Secondary | ICD-10-CM | POA: Diagnosis not present

## 2022-05-13 DIAGNOSIS — R2681 Unsteadiness on feet: Secondary | ICD-10-CM

## 2022-05-13 DIAGNOSIS — I739 Peripheral vascular disease, unspecified: Secondary | ICD-10-CM

## 2022-05-13 DIAGNOSIS — L97929 Non-pressure chronic ulcer of unspecified part of left lower leg with unspecified severity: Secondary | ICD-10-CM

## 2022-05-13 DIAGNOSIS — R600 Localized edema: Secondary | ICD-10-CM

## 2022-05-13 DIAGNOSIS — Z9181 History of falling: Secondary | ICD-10-CM | POA: Diagnosis not present

## 2022-05-13 MED ORDER — FUROSEMIDE 20 MG PO TABS: 20.0000 mg | ORAL_TABLET | Freq: Every day | ORAL | 0 refills | Status: DC | PRN

## 2022-05-13 MED ORDER — SULFAMETHOXAZOLE-TRIMETHOPRIM 800-160 MG PO TABS: 1.0000 | ORAL_TABLET | Freq: Two times a day (BID) | ORAL | 0 refills | Status: AC

## 2022-05-13 MED ORDER — AMOXICILLIN-POT CLAVULANATE 875-125 MG PO TABS: 1.0000 | ORAL_TABLET | Freq: Two times a day (BID) | ORAL | 0 refills | Status: DC

## 2022-05-13 MED ORDER — SILVER SULFADIAZINE 1 % EX CREA: 1.0000 | TOPICAL_CREAM | Freq: Every day | CUTANEOUS | 0 refills | Status: DC

## 2022-05-13 NOTE — Telephone Encounter (Signed)
   Chief Complaint: Wound left shin,open skin, skin around wound is bright red, swollen Symptoms: Above Frequency: Sunday Pertinent Negatives: Patient denies  Disposition: [] ED /[] Urgent Care (no appt availability in office) / [x] Appointment(In office/virtual)/ []  [facility]/ [] Home Care/ [] Refused Recommended Disposition /[] [facility]/ [x]  Follow-up with PCP Additional Notes: in practice notified of wound. Will forward to provider. Reason for Disposition  [1] Looks infected AND [2] large red area (> 2 inches or 5 cm) or streak  Answer Assessment - Initial Assessment Questions 1. APPEARANCE of INJURY: "What does the injury look like?"      Open skin area 2. SIZE: "How large is the cut?"      Large 3. BLEEDING: "Is it bleeding now?" If Yes, ask: "Is it difficult to stop?"      No 4. LOCATION: "Where is the injury located?"      Left shin 5. ONSET: "How long ago did the injury occur?"      Sunday 6. MECHANISM: "Tell me how it happened."      Unsure, maybe from socks 7. TETANUS: "When was the last tetanus booster?"     Unsure 8. PREGNANCY: "Is there any chance you are pregnant?" "When was your last menstrual period?"     N/a  Protocols used: Skin Injury-A-AH

## 2022-05-13 NOTE — Progress Notes (Signed)
Subjective:    Patient ID: Shawn Rivas, male    DOB: 1966/06/12, 56 y.o.   MRN: [id number]  Shawn Rivas is a 56 y.o. male presenting on 05/13/2022 for Wound Infection, Leg Pain, and Leg Swelling  Patient presents for a same day appointment.   HPI  Left Lower Leg Cellulitis Superficial ulceration / abrasion Lower extremity edema Type 2 Diabetes  Persistent edema both lower extremities with peripheral vascular disease and venous insufficiency. He has had issues with chronic swelling and prior wound on lower extremity previously.  New problem identified acutely past 2-3 days with worsening left lower extremity redness swelling and superficial ulceration skin slough, had small spot and skin peeled, he had some oozing of fluid out of leg with swelling. No fevers or chills  He does pool PT therapy and did this on Monday but was advised against it, he asks if he can continue.  Additionally he admits he has self discontinued all of his medication at this this time. Including insulin, diabetes, blood pressure. Changed his diet Carnivore diet, plant based Weight down Drinks a lot of fluid daily Frequent urination       02/20/2022   10:05 AM  Depression screen PHQ 2/9  Decreased Interest 1  Down, Depressed, Hopeless 1  PHQ - 2 Score 2  Altered sleeping 3  Tired, decreased energy 1  Change in appetite 1  Feeling bad or failure about yourself  2  Trouble concentrating 3  Moving slowly or fidgety/restless 2  Suicidal thoughts 0  PHQ-9 Score 14  Difficult doing work/chores Extremely dIfficult    Social History   Tobacco Use   Smoking status: Former    Packs/day: 3.00    Years: 20.00    Total pack years: 60.00    Types: Cigarettes    Quit date: 10/12/2010    Years since quitting: 11.5   Smokeless tobacco: Never  Vaping Use   Vaping Use: Never used  Substance Use Topics   Alcohol use: Yes   Drug use: No    Review of Systems Per HPI unless specifically indicated  above     Objective:    BP (!) 143/81   Pulse 90   Ht '5\' 10"'  (1.778 m)   Wt 228 lb 9.6 oz (103.7 kg)   SpO2 100%   BMI 32.80 kg/m   Wt Readings from Last 3 Encounters:  05/13/22 228 lb 9.6 oz (103.7 kg)  05/13/22 225 lb (102.1 kg)  04/28/22 228 lb 12.8 oz (103.8 kg)    Physical Exam Vitals and nursing note reviewed.  Constitutional:      General: He is not in acute distress.    Appearance: Normal appearance. He is well-developed. He is obese. He is not diaphoretic.     Comments: Well-appearing, comfortable, cooperative  HENT:     Head: Normocephalic and atraumatic.  Eyes:     General:        Right eye: No discharge.        Left eye: No discharge.     Conjunctiva/sclera: Conjunctivae normal.  Cardiovascular:     Rate and Rhythm: Normal rate.  Pulmonary:     Effort: Pulmonary effort is normal.  Musculoskeletal:     Right lower leg: Edema present.     Left lower leg: Edema present.     Comments: Using walker  Skin:    General: Skin is warm and dry.     Findings: Lesion (left lower extremity anterior 5 x  7 region superficial skin slough ulceration without deeper ulceration. has some serosanguinous oozing. no active bleeding or purulence. warmth erythema surrounding on leg.) present. No erythema or rash.  Neurological:     Mental Status: He is alert and oriented to person, place, and time.  Psychiatric:        Mood and Affect: Mood normal.        Behavior: Behavior normal.        Thought Content: Thought content normal.     Comments: Well groomed, good eye contact, normal speech and thoughts      Results for orders placed or performed in visit on 02/23/22  Hemoglobin A1c  Result Value Ref Range   Hgb A1c MFr Bld 10.2 (H) <5.7 % of total Hgb   Mean Plasma Glucose 246 mg/dL   eAG (mmol/L) 13.6 mmol/L  Lipid panel  Result Value Ref Range   Cholesterol 174 <200 mg/dL   HDL 47 > OR = 40 mg/dL   Triglycerides 86 <150 mg/dL   LDL Cholesterol (Calc) 109 (H) mg/dL  (calc)   Total CHOL/HDL Ratio 3.7 <5.0 (calc)   Non-HDL Cholesterol (Calc) 127 <130 mg/dL (calc)  COMPLETE METABOLIC PANEL WITH GFR  Result Value Ref Range   Glucose, Bld 135 (H) 65 - 99 mg/dL   BUN 37 (H) 7 - 25 mg/dL   Creat 1.00 0.70 - 1.30 mg/dL   eGFR 89 > OR = 60 mL/min/1.35m   BUN/Creatinine Ratio 37 (H) 6 - 22 (calc)   Sodium 142 135 - 146 mmol/L   Potassium 4.5 3.5 - 5.3 mmol/L   Chloride 110 98 - 110 mmol/L   CO2 22 20 - 32 mmol/L   Calcium 10.1 8.6 - 10.3 mg/dL   Total Protein 7.1 6.1 - 8.1 g/dL   Albumin 3.9 3.6 - 5.1 g/dL   Globulin 3.2 1.9 - 3.7 g/dL (calc)   AG Ratio 1.2 1.0 - 2.5 (calc)   Total Bilirubin 0.3 0.2 - 1.2 mg/dL   Alkaline phosphatase (APISO) 80 35 - 144 U/L   AST 22 10 - 35 U/L   ALT 27 9 - 46 U/L      Assessment & Plan:   Problem List Items Addressed This Visit     Peripheral vascular disease (HCC)   Relevant Medications   furosemide (LASIX) 20 MG tablet   Other Visit Diagnoses     Cellulitis of left lower extremity    -  Primary   Relevant Medications   amoxicillin-clavulanate (AUGMENTIN) 875-125 MG tablet   sulfamethoxazole-trimethoprim (BACTRIM DS) 800-160 MG tablet   silver sulfADIAZINE (SILVADENE) 1 % cream   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 He has improved diet lifestyle and feels better but I expressed that I would recommend resume of  medications. He declines at this time. We will review again in future.  Discontinue  pool PT at this time due to goal to avoid submerging the open wound in pool. Can resume when it is healed.  Start antibiotic course with Augmentin oral BID 10 day If not improving after 3-5 days you can start Bactrim antibiotic as well and take both.  Ordered fluid pill furosemide 24m tabs take as needed if significant swelling not improving or oozing, can take max 2 pills per day up to 1-7 days total then stop.  RICE therapy, discussed limit of antibiotics and healing w/ edema.  Add Silvadene cream topical for when wound is re-epithelialize more, for now can use ointment  [facility]consider [facility].  Return criteria if worsening when to seek care hospital ED if worsening, may warrant IV antibiotics if failure of oral antibiotics takes place.   Meds ordered this encounter  Medications   amoxicillin-clavulanate (AUGMENTIN) 875-125 MG tablet    Sig: Take 1 tablet by mouth 2 (two) times daily.    Dispense:  20 tablet    Refill:  0   furosemide (LASIX) 20 MG tablet    Sig: Take 1-2 tablets (20-40 mg total) by mouth daily as needed for edema. For up to 1-7 days max.    Dispense:  30 tablet    Refill:  0   sulfamethoxazole-trimethoprim (BACTRIM DS) 800-160 MG tablet    Sig: Take 1 tablet by mouth 2 (two) times daily for 7 days.    Dispense:  14 tablet    Refill:  0   silver sulfADIAZINE (SILVADENE) 1 % cream    Sig: Apply 1 Application topically daily.    Dispense:  50 g    Refill:  0      Follow up plan: Return if symptoms worsen or fail to improve.   ANobie Putnam DO [facility] 05/13/2022, 2:30 PM
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
## 2022-05-13 NOTE — Patient Instructions (Addendum)
Thank you for coming to the office today.  Start Augmentin antibiotic  If not improving after 3-5 days you can start Bactrim antibiotic as well and take both.  Ordered fluid pill furosemide 20mg  tabs take as needed if significant swelling not improving or oozing, can take max 2 pills per day up to 1-7 days total then stop.  Consider wound care vs hospital ED if worse.  Use silvadene cream as well as it starts to close up more.  If not improving you may need to return for re-evaluation. But if more severe worsening such as spreading redness or streaking redness, significantly larger size, persistent drainage of pus, increased pain, fevers/chills, nausea vomiting and cannot take antibiotic. If significantly worse symptoms or most of these symptoms, would recommend going straight to Hospital Emergency Dept as you may require IV antibiotics instead.  Please schedule a Follow-up Appointment to: Return if symptoms worsen or fail to improve.  If you have any other questions or concerns, please feel free to call the office or send a message through MyChart. You may also schedule an earlier appointment if necessary.  Additionally, you may be receiving a survey about your experience at our office within a few days to 1 week by e-mail or mail. We value your feedback.  , DO [facility], [facility]

## 2022-05-13 NOTE — Telephone Encounter (Signed)
Agreed to do work in apt for today, double book  Saralyn Pilar, DO [employer] [facility] 05/13/2022, 11:46 AM

## 2022-05-14 ENCOUNTER — Ambulatory Visit: Payer: Medicare Other | Admitting: Family Medicine

## 2022-05-14 ENCOUNTER — Telehealth: Payer: Self-pay

## 2022-05-14 NOTE — Telephone Encounter (Signed)
I left a message for the patient to call back and schedule their Medicare Annual Wellness Visit (AWV) virtually, by telephone, or face-to-face.   Ainara Eldridge, CMA [phone]  

## 2022-05-15 ENCOUNTER — Encounter (HOSPITAL_BASED_OUTPATIENT_CLINIC_OR_DEPARTMENT_OTHER): Payer: Self-pay

## 2022-05-15 ENCOUNTER — Ambulatory Visit (HOSPITAL_BASED_OUTPATIENT_CLINIC_OR_DEPARTMENT_OTHER): Payer: Medicare Other | Admitting: Physical Therapy

## 2022-05-20 ENCOUNTER — Ambulatory Visit (HOSPITAL_BASED_OUTPATIENT_CLINIC_OR_DEPARTMENT_OTHER): Payer: Medicare Other | Admitting: Physical Therapy

## 2022-05-21 DIAGNOSIS — E1165 Type 2 diabetes mellitus with hyperglycemia: Secondary | ICD-10-CM | POA: Diagnosis not present

## 2022-05-22 ENCOUNTER — Ambulatory Visit (HOSPITAL_BASED_OUTPATIENT_CLINIC_OR_DEPARTMENT_OTHER): Payer: Self-pay | Admitting: Physical Therapy

## 2022-05-25 ENCOUNTER — Encounter (HOSPITAL_BASED_OUTPATIENT_CLINIC_OR_DEPARTMENT_OTHER): Payer: Self-pay | Admitting: Physical Therapy

## 2022-05-25 ENCOUNTER — Other Ambulatory Visit: Payer: Self-pay | Admitting: Family Medicine

## 2022-05-25 ENCOUNTER — Ambulatory Visit (HOSPITAL_BASED_OUTPATIENT_CLINIC_OR_DEPARTMENT_OTHER): Payer: Medicare Other | Attending: Orthopedic Surgery | Admitting: Physical Therapy

## 2022-05-25 DIAGNOSIS — M6281 Muscle weakness (generalized): Secondary | ICD-10-CM

## 2022-05-25 DIAGNOSIS — R600 Localized edema: Secondary | ICD-10-CM

## 2022-05-25 DIAGNOSIS — R262 Difficulty in walking, not elsewhere classified: Secondary | ICD-10-CM

## 2022-05-25 DIAGNOSIS — R2681 Unsteadiness on feet: Secondary | ICD-10-CM

## 2022-05-25 DIAGNOSIS — Z9181 History of falling: Secondary | ICD-10-CM | POA: Diagnosis not present
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
## 2022-05-25 NOTE — Telephone Encounter (Signed)
Requested medication (s) are due for refill today:   Requested medication (s) are on the active medication list: yes  Last refill:  05/13/22  Future visit scheduled: yes  Notes to clinic:  rx was for 1-7 days only. Please advise for refill     Requested Prescriptions  Pending Prescriptions Disp Refills   furosemide (LASIX) 20 MG tablet [Pharmacy Med Name: FUROSEMIDE 20 MG TABLET] 30 tablet 0    Sig: TAKE 1-2 TABLETS BY MOUTH DAILY AS NEEDED FOR EDEMA. TAKE FOR UP TO 1-7 DAYS MAX     Cardiovascular:  Diuretics - Loop Failed - 05/25/2022  6:23 AM      Failed - Mg Level in normal range and within 180 days    No results found for: "MG"       Failed - Last BP in normal range    BP Readings from Last 1 Encounters:  05/13/22 (!) 143/81         Passed - K in normal range and within 180 days    Potassium  Date Value Ref Range Status  02/23/2022 4.5 3.5 - 5.3 mmol/L Final  08/20/2014 3.8 3.5 - 5.1 mmol/L Final         Passed - Ca in normal range and within 180 days    Calcium  Date Value Ref Range Status  02/23/2022 10.1 8.6 - 10.3 mg/dL Final   Calcium, Total  Date Value Ref Range Status  08/20/2014 7.9 (L) 8.5 - 10.1 mg/dL Final         Passed - Na in normal range and within 180 days    Sodium  Date Value Ref Range Status  02/23/2022 142 135 - 146 mmol/L Final  08/20/2014 138 136 - 145 mmol/L Final         Passed - Cr in normal range and within 180 days    Creat  Date Value Ref Range Status  02/23/2022 1.00 0.70 - 1.30 mg/dL Final         Passed - Cl in normal range and within 180 days    Chloride  Date Value Ref Range Status  02/23/2022 110 98 - 110 mmol/L Final  08/20/2014 104 98 - 107 mmol/L Final         Passed - Valid encounter within last 6 months    Recent Outpatient Visits           1 week ago Cellulitis of left lower extremity   [facility] Smitty Cords, DO   3 months ago Type 2 diabetes mellitus with other specified  complication, with long-term current use of insulin (HCC)   [facility], Netta Neat, DO       Future Appointments             Tomorrow Smitty Cords, DO [facility], [facility]

## 2022-05-26 ENCOUNTER — Ambulatory Visit (INDEPENDENT_AMBULATORY_CARE_PROVIDER_SITE_OTHER): Payer: Medicare Other | Admitting: Family Medicine

## 2022-05-26 ENCOUNTER — Encounter: Payer: Self-pay | Admitting: Family Medicine

## 2022-05-26 ENCOUNTER — Telehealth: Payer: Self-pay | Admitting: Physical Therapy

## 2022-05-26 VITALS — BP 129/75 | HR 77 | Ht 70.0 in | Wt 219.4 lb

## 2022-05-26 DIAGNOSIS — I739 Peripheral vascular disease, unspecified: Secondary | ICD-10-CM | POA: Diagnosis not present

## 2022-05-26 DIAGNOSIS — I10 Essential (primary) hypertension: Secondary | ICD-10-CM

## 2022-05-26 DIAGNOSIS — L97929 Non-pressure chronic ulcer of unspecified part of left lower leg with unspecified severity: Secondary | ICD-10-CM

## 2022-05-26 DIAGNOSIS — E785 Hyperlipidemia, unspecified: Secondary | ICD-10-CM

## 2022-05-26 DIAGNOSIS — E1142 Type 2 diabetes mellitus with diabetic polyneuropathy: Secondary | ICD-10-CM

## 2022-05-26 DIAGNOSIS — E1169 Type 2 diabetes mellitus with other specified complication: Secondary | ICD-10-CM

## 2022-05-26 DIAGNOSIS — E11622 Type 2 diabetes mellitus with other skin ulcer: Secondary | ICD-10-CM

## 2022-05-26 DIAGNOSIS — Z794 Long term (current) use of insulin: Secondary | ICD-10-CM | POA: Diagnosis not present

## 2022-05-26 LAB — POCT GLYCOSYLATED HEMOGLOBIN (HGB A1C): Hemoglobin A1C: 6.1 % — AB (ref 4.0–5.6)

## 2022-05-26 NOTE — Assessment & Plan Note (Signed)
Previously Managed by [facility] A1c 6.1, controlled off meds except humalog insulin 1-2 times per day, now with major diet overhaul carb free, carnivore diet Complications - peripheral neuropathy, other including hyperlipidemia Off Lantus 40, off Metformin, Off Farxiga  Plan Continue Humalog mealtime insulin 20u 1-2 times per day pending sugars On CGM with good results  Future restart ARB Statin based on labs and interest, would do low doses Urine microalbumin today

## 2022-05-26 NOTE — Progress Notes (Signed)
Subjective:    Patient ID: Shawn Rivas, male    DOB: 08-14-1966, 56 y.o.   MRN: [id number]  Shawn Rivas is a 56 y.o. male presenting on 05/26/2022 for Diabetes and Hypertension   HPI  Diabetes, Type 2 Followed by Dr Tedd Sias [facility]) He is consuming carnivore diet regularly and his CBG and BP has improved. Has improved overall with diet  A1c down to 6.1 today CGM meter, avg 120, 150-180 Off Metformin Farxiga Ozempic Lantus Daily using Humalog 20 in AM with breakfast maybe PM dose if need  CHRONIC HTN: BP controlled range Current Meds - OFF Losartan and Metoprolol Reports good compliance, took meds today. Tolerating well, w/o complaints. Lifestyle: - Diet: carnivore diet, water and electrolytes - Exercise: PT Denies CP, dyspnea, HA, edema, dizziness / lightheadedness  HYPERLIPIDEMIA: - Reports no concerns. Last lipid panel mostly controlled 02/2022 off med for 1 month Off Lovastatin 40mg  due to his preference  History Diabetic ulcer on left foot Followed by Podiatry and Vascular Has had work up with vascular Treating L foot ulcer/callus, using "horse shoe" padding  Additionally he admits he has self discontinued all of his medication 01/2022 Changed his diet Carnivore diet, plant based Weight down Drinks a lot of fluid daily Frequent urination       02/20/2022   10:05 AM  Depression screen PHQ 2/9  Decreased Interest 1  Down, Depressed, Hopeless 1  PHQ - 2 Score 2  Altered sleeping 3  Tired, decreased energy 1  Change in appetite 1  Feeling bad or failure about yourself  2  Trouble concentrating 3  Moving slowly or fidgety/restless 2  Suicidal thoughts 0  PHQ-9 Score 14  Difficult doing work/chores Extremely dIfficult    Social History   Tobacco Use   Smoking status: Former    Packs/day: 3.00    Years: 20.00    Total pack years: 60.00    Types: Cigarettes    Quit date: 10/12/2010    Years since quitting: 11.6   Smokeless tobacco: Never   Vaping Use   Vaping Use: Never used  Substance Use Topics   Alcohol use: Yes   Drug use: No    Review of Systems Per HPI unless specifically indicated above     Objective:    BP 129/75   Pulse 77   Ht 5\' 10"  (1.778 m)   Wt 219 lb 6.4 oz (99.5 kg)   SpO2 100%   BMI 31.48 kg/m   Wt Readings from Last 3 Encounters:  05/26/22 219 lb 6.4 oz (99.5 kg)  05/13/22 228 lb 9.6 oz (103.7 kg)  05/13/22 225 lb (102.1 kg)    Physical Exam Vitals and nursing note reviewed.  Constitutional:      General: He is not in acute distress.    Appearance: He is well-developed. He is not diaphoretic.     Comments: Well-appearing, comfortable, cooperative  HENT:     Head: Normocephalic and atraumatic.  Eyes:     General:        Right eye: No discharge.        Left eye: No discharge.     Conjunctiva/sclera: Conjunctivae normal.  Neck:     Thyroid: No thyromegaly.  Cardiovascular:     Rate and Rhythm: Normal rate and regular rhythm.     Pulses: Normal pulses.     Heart sounds: Normal heart sounds. No murmur heard. Pulmonary:     Effort: Pulmonary effort is normal. No respiratory  distress.     Breath sounds: Normal breath sounds. No wheezing or rales.  Musculoskeletal:        General: Normal range of motion.     Cervical back: Normal range of motion and neck supple.  Lymphadenopathy:     Cervical: No cervical adenopathy.  Skin:    General: Skin is warm and dry.     Findings: Lesion (Left lower extremity anterior leg scab, superficial ulceration healing since last visit 1 week ago) present. No erythema or rash.  Neurological:     Mental Status: He is alert and oriented to person, place, and time. Mental status is at baseline.  Psychiatric:        Behavior: Behavior normal.     Comments: Well groomed, good eye contact, normal speech and thoughts    Diabetic Foot Exam - Simple   Simple Foot Form Diabetic Foot exam was performed with the following findings: Yes 05/26/2022 10:02 AM   Visual Inspection See comments: Yes Sensation Testing See comments: Yes Pulse Check Posterior Tibialis and Dorsalis pulse intact bilaterally: Yes Comments Bilateral feet with callus formation, L foot with prior ulceration callus. Reduction of monofilament sensation.      Results for orders placed or performed in visit on 05/26/22  POCT HgB A1C  Result Value Ref Range   Hemoglobin A1C 6.1 (A) 4.0 - 5.6 %      Assessment & Plan:   Problem List Items Addressed This Visit     Diabetic polyneuropathy associated with type 2 diabetes mellitus (HCC)   Essential hypertension    Controlled BP now off medication Improved diet lifestyle Off Losartan 100 and Metoprolol XL 25  Reconsider low dose ARB for renal protection, pending labs      Relevant Orders   COMPLETE METABOLIC PANEL WITH GFR   CBC with Differential/Platelet   Hyperlipidemia due to type 2 diabetes mellitus (HCC)   Relevant Orders   Lipid panel   COMPLETE METABOLIC PANEL WITH GFR   Peripheral vascular disease (HCC)    PAD with DM, HLD HTN Improved lifestyle diet control Impacting his wound healing Off med management Reconsider low dose Statin therapy pending lab today      Type 2 diabetes mellitus with other specified complication (HCC) - Primary    Previously Managed by [facility] A1c 6.1, controlled off meds except humalog insulin 1-2 times per day, now with major diet overhaul carb free, carnivore diet Complications - peripheral neuropathy, other including hyperlipidemia Off Lantus 40, off Metformin, Off Farxiga  Plan Continue Humalog mealtime insulin 20u 1-2 times per day pending sugars On CGM with good results  Future restart ARB Statin based on labs and interest, would do low doses Urine microalbumin today      Relevant Orders   POCT HgB A1C (Completed)   CBC with Differential/Platelet   Urine Microalbumin w/creat. 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 Discussion on wound healing overall and management.  Pending lab review, will consider offering low dose ARB Losartan 25mg  daily or other version such as Olmesartan 5mg . And consider low dose Statin therapy Rosuvastatin 5mg  for prevention.   Orders Placed This Encounter  Procedures   Lipid panel    Order Specific Question:   Has the patient fasted?    Answer:   Yes   COMPLETE  METABOLIC PANEL WITH GFR   CBC with Differential/Platelet   Urine Microalbumin w/creat. ratio   POCT HgB A1C     No orders of the defined types were placed in this encounter.     Follow up plan: Return in about 4 months (around 09/25/2022) for 4 month follow-up DM A1c.  Saralyn Pilar, DO [facility] 05/26/2022, 8:39 AM

## 2022-05-26 NOTE — Telephone Encounter (Signed)
Called patient back at his request. Patient stated he wanted to share how excited he was about his recent bloodwork, bodyweight, and blood pressure improvements that learned when he saw his PCP today. He also returned to aquatic therapy yesterday by wearing a waterproof bandage.   Luretha Murphy. [name], PT, DPT 05/26/22, 11:54 AM  [facility] [address] [place], [place] [address] P: [phone] I F: [phone]

## 2022-05-26 NOTE — Patient Instructions (Addendum)
Thank you for coming to the office today.  Recent Labs    02/23/22 0756 05/26/22 0841  HGBA1C 10.2* 6.1*    Keep on track  We discussed today based on lab results for cholesterol - we can consider lower dose rx cholesterol med to help reduce risk.  We may also consider a low dose BP med that protects kidneys similar to the Losartan you were on before.  Please schedule a Follow-up Appointment to: Return in about 4 months (around 09/25/2022) for 4 month follow-up DM A1c.  If you have any other questions or concerns, please feel free to call the office or send a message through MyChart. You may also schedule an earlier appointment if necessary.  Additionally, you may be receiving a survey about your experience at our office within a few days to 1 week by e-mail or mail. We value your feedback.  Saralyn Pilar, DO [facility] [place]

## 2022-05-26 NOTE — Assessment & Plan Note (Signed)
PAD with DM, HLD HTN Improved lifestyle diet control Impacting his wound healing Off med management Reconsider low dose Statin therapy pending lab today

## 2022-05-26 NOTE — Assessment & Plan Note (Signed)
Controlled BP now off medication Improved diet lifestyle Off Losartan 100 and Metoprolol XL 25  Reconsider low dose ARB for renal protection, pending labs

## 2022-05-27 LAB — CBC WITH DIFFERENTIAL/PLATELET
Absolute Monocytes: 480 cells/uL (ref 200–950)
Basophils Absolute: 102 cells/uL (ref 0–200)
Basophils Relative: 1.7 %
Eosinophils Absolute: 270 cells/uL (ref 15–500)
Eosinophils Relative: 4.5 %
HCT: 44.3 % (ref 38.5–50.0)
Hemoglobin: 14.8 g/dL (ref 13.2–17.1)
Lymphs Abs: 1854 cells/uL (ref 850–3900)
MCH: 31.3 pg (ref 27.0–33.0)
MCHC: 33.4 g/dL (ref 32.0–36.0)
MCV: 93.7 fL (ref 80.0–100.0)
MPV: 10.3 fL (ref 7.5–12.5)
Monocytes Relative: 8 %
Neutro Abs: 3294 cells/uL (ref 1500–7800)
Neutrophils Relative %: 54.9 %
Platelets: 439 10*3/uL — ABNORMAL HIGH (ref 140–400)
RBC: 4.73 10*6/uL (ref 4.20–5.80)
RDW: 12 % (ref 11.0–15.0)
Total Lymphocyte: 30.9 %
WBC: 6 10*3/uL (ref 3.8–10.8)

## 2022-05-27 LAB — COMPLETE METABOLIC PANEL WITH GFR
AG Ratio: 1 (calc) (ref 1.0–2.5)
ALT: 28 U/L (ref 9–46)
AST: 20 U/L (ref 10–35)
Albumin: 3.7 g/dL (ref 3.6–5.1)
Alkaline phosphatase (APISO): 107 U/L (ref 35–144)
BUN/Creatinine Ratio: 35 (calc) — ABNORMAL HIGH (ref 6–22)
BUN: 38 mg/dL — ABNORMAL HIGH (ref 7–25)
CO2: 23 mmol/L (ref 20–32)
Calcium: 10.2 mg/dL (ref 8.6–10.3)
Chloride: 106 mmol/L (ref 98–110)
Creat: 1.08 mg/dL (ref 0.70–1.30)
Globulin: 3.6 g/dL (calc) (ref 1.9–3.7)
Glucose, Bld: 189 mg/dL — ABNORMAL HIGH (ref 65–99)
Potassium: 4.5 mmol/L (ref 3.5–5.3)
Sodium: 139 mmol/L (ref 135–146)
Total Bilirubin: 0.3 mg/dL (ref 0.2–1.2)
Total Protein: 7.3 g/dL (ref 6.1–8.1)
eGFR: 81 mL/min/{1.73_m2} (ref >=60)

## 2022-05-27 LAB — MICROALBUMIN / CREATININE URINE RATIO
Creatinine, Urine: 59 mg/dL (ref 20–320)
Microalb Creat Ratio: 1656 mcg/mg creat — ABNORMAL HIGH (ref <30)
Microalb, Ur: 97.7 mg/dL

## 2022-05-27 LAB — LIPID PANEL
Cholesterol: 215 mg/dL — ABNORMAL HIGH (ref <200)
HDL: 49 mg/dL (ref >=40)
LDL Cholesterol (Calc): 143 mg/dL (calc) — ABNORMAL HIGH
Non-HDL Cholesterol (Calc): 166 mg/dL (calc) — ABNORMAL HIGH (ref <130)
Total CHOL/HDL Ratio: 4.4 (calc) (ref <5.0)
Triglycerides: 109 mg/dL (ref <150)

## 2022-05-28 ENCOUNTER — Encounter (HOSPITAL_BASED_OUTPATIENT_CLINIC_OR_DEPARTMENT_OTHER): Payer: Self-pay | Admitting: Physical Therapy

## 2022-05-28 ENCOUNTER — Ambulatory Visit (HOSPITAL_BASED_OUTPATIENT_CLINIC_OR_DEPARTMENT_OTHER): Payer: Medicare Other | Admitting: Physical Therapy

## 2022-05-28 DIAGNOSIS — Z9181 History of falling: Secondary | ICD-10-CM | POA: Diagnosis not present

## 2022-05-28 DIAGNOSIS — R262 Difficulty in walking, not elsewhere classified: Secondary | ICD-10-CM

## 2022-05-28 DIAGNOSIS — M6281 Muscle weakness (generalized): Secondary | ICD-10-CM | POA: Diagnosis not present

## 2022-05-28 DIAGNOSIS — R2681 Unsteadiness on feet: Secondary | ICD-10-CM
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
## 2022-06-01 ENCOUNTER — Ambulatory Visit (HOSPITAL_BASED_OUTPATIENT_CLINIC_OR_DEPARTMENT_OTHER): Payer: Medicare Other | Admitting: Physical Therapy

## 2022-06-03 ENCOUNTER — Ambulatory Visit: Payer: Medicare Other | Admitting: Physical Therapy

## 2022-06-04 ENCOUNTER — Ambulatory Visit: Payer: Medicare Other | Admitting: Physical Therapy

## 2022-06-04 ENCOUNTER — Encounter: Payer: Self-pay | Admitting: Physical Therapy

## 2022-06-04 VITALS — BP 156/88 | HR 80

## 2022-06-04 DIAGNOSIS — R2681 Unsteadiness on feet: Secondary | ICD-10-CM | POA: Diagnosis not present

## 2022-06-04 DIAGNOSIS — M6281 Muscle weakness (generalized): Secondary | ICD-10-CM | POA: Diagnosis not present

## 2022-06-04 DIAGNOSIS — Z9181 History of falling: Secondary | ICD-10-CM

## 2022-06-04 DIAGNOSIS — R262 Difficulty in walking, not elsewhere classified: Secondary | ICD-10-CM

## 2022-06-04 NOTE — Progress Notes (Signed)
pictures
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
## 2022-06-17 ENCOUNTER — Encounter (HOSPITAL_BASED_OUTPATIENT_CLINIC_OR_DEPARTMENT_OTHER): Payer: Self-pay | Admitting: Physical Therapy

## 2022-06-17 ENCOUNTER — Ambulatory Visit (HOSPITAL_BASED_OUTPATIENT_CLINIC_OR_DEPARTMENT_OTHER): Payer: Medicare Other | Attending: Orthopedic Surgery | Admitting: Physical Therapy

## 2022-06-17 DIAGNOSIS — M6281 Muscle weakness (generalized): Secondary | ICD-10-CM

## 2022-06-17 DIAGNOSIS — R2681 Unsteadiness on feet: Secondary | ICD-10-CM

## 2022-06-17 DIAGNOSIS — R262 Difficulty in walking, not elsewhere classified: Secondary | ICD-10-CM

## 2022-06-17 DIAGNOSIS — Z9181 History of falling: Secondary | ICD-10-CM
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
## 2022-06-21 DIAGNOSIS — E1165 Type 2 diabetes mellitus with hyperglycemia: Secondary | ICD-10-CM | POA: Diagnosis not present

## 2022-06-22 ENCOUNTER — Encounter (HOSPITAL_BASED_OUTPATIENT_CLINIC_OR_DEPARTMENT_OTHER): Payer: Self-pay | Admitting: Physical Therapy

## 2022-06-22 ENCOUNTER — Ambulatory Visit (HOSPITAL_BASED_OUTPATIENT_CLINIC_OR_DEPARTMENT_OTHER): Payer: Medicare Other | Admitting: Physical Therapy

## 2022-06-22 DIAGNOSIS — Z9181 History of falling: Secondary | ICD-10-CM

## 2022-06-22 DIAGNOSIS — R2681 Unsteadiness on feet: Secondary | ICD-10-CM | POA: Diagnosis not present

## 2022-06-22 DIAGNOSIS — R262 Difficulty in walking, not elsewhere classified: Secondary | ICD-10-CM | POA: Diagnosis not present

## 2022-06-22 DIAGNOSIS — M6281 Muscle weakness (generalized): Secondary | ICD-10-CM
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
## 2022-06-26 ENCOUNTER — Encounter (HOSPITAL_BASED_OUTPATIENT_CLINIC_OR_DEPARTMENT_OTHER): Payer: Self-pay | Admitting: Physical Therapy

## 2022-06-26 ENCOUNTER — Ambulatory Visit (HOSPITAL_BASED_OUTPATIENT_CLINIC_OR_DEPARTMENT_OTHER): Payer: Medicare Other | Admitting: Physical Therapy

## 2022-06-26 DIAGNOSIS — R2681 Unsteadiness on feet: Secondary | ICD-10-CM

## 2022-06-26 DIAGNOSIS — Z9181 History of falling: Secondary | ICD-10-CM

## 2022-06-26 DIAGNOSIS — R262 Difficulty in walking, not elsewhere classified: Secondary | ICD-10-CM | POA: Diagnosis not present

## 2022-06-26 DIAGNOSIS — M6281 Muscle weakness (generalized): Secondary | ICD-10-CM | POA: Diagnosis not present
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
## 2022-07-06 ENCOUNTER — Telehealth (HOSPITAL_BASED_OUTPATIENT_CLINIC_OR_DEPARTMENT_OTHER): Payer: Self-pay | Admitting: Physical Therapy

## 2022-07-06 ENCOUNTER — Ambulatory Visit (HOSPITAL_BASED_OUTPATIENT_CLINIC_OR_DEPARTMENT_OTHER): Payer: Medicare Other | Admitting: Physical Therapy

## 2022-07-06 DIAGNOSIS — M25562 Pain in left knee: Secondary | ICD-10-CM | POA: Diagnosis not present

## 2022-07-06 DIAGNOSIS — M1732 Unilateral post-traumatic osteoarthritis, left knee: Secondary | ICD-10-CM | POA: Diagnosis not present

## 2022-07-06 DIAGNOSIS — E1140 Type 2 diabetes mellitus with diabetic neuropathy, unspecified: Secondary | ICD-10-CM | POA: Diagnosis not present

## 2022-07-06 DIAGNOSIS — B351 Tinea unguium: Secondary | ICD-10-CM | POA: Diagnosis not present

## 2022-07-06 DIAGNOSIS — L97521 Non-pressure chronic ulcer of other part of left foot limited to breakdown of skin: Secondary | ICD-10-CM | POA: Diagnosis not present

## 2022-07-06 DIAGNOSIS — Z794 Long term (current) use of insulin: Secondary | ICD-10-CM | POA: Diagnosis not present

## 2022-07-06 DIAGNOSIS — L97511 Non-pressure chronic ulcer of other part of right foot limited to breakdown of skin: Secondary | ICD-10-CM | POA: Diagnosis not present

## 2022-07-06 NOTE — Telephone Encounter (Signed)
Pt called and reports he had a fall and is just leaving the hospital unable to make it on time to therapy today. Reports xray shows possible fx but MRI to be scheduled.  He states MD says it is ok for him to participate in aquatic therapy as long as he doesn't have to put much weight on He has appointment on Wed this week.  Will assess at that time

## 2022-07-08 ENCOUNTER — Other Ambulatory Visit: Payer: Self-pay | Admitting: Orthopedic Surgery

## 2022-07-08 ENCOUNTER — Ambulatory Visit (HOSPITAL_BASED_OUTPATIENT_CLINIC_OR_DEPARTMENT_OTHER): Payer: Self-pay | Admitting: Physical Therapy

## 2022-07-08 DIAGNOSIS — M1732 Unilateral post-traumatic osteoarthritis, left knee: Secondary | ICD-10-CM

## 2022-07-08 DIAGNOSIS — M25562 Pain in left knee: Secondary | ICD-10-CM

## 2022-07-09 ENCOUNTER — Ambulatory Visit (HOSPITAL_BASED_OUTPATIENT_CLINIC_OR_DEPARTMENT_OTHER): Payer: Self-pay | Admitting: Physical Therapy

## 2022-07-10 ENCOUNTER — Ambulatory Visit (HOSPITAL_BASED_OUTPATIENT_CLINIC_OR_DEPARTMENT_OTHER): Payer: Medicare Other | Admitting: Physical Therapy

## 2022-07-16 ENCOUNTER — Encounter (HOSPITAL_BASED_OUTPATIENT_CLINIC_OR_DEPARTMENT_OTHER): Payer: Self-pay | Admitting: Physical Therapy

## 2022-07-16 ENCOUNTER — Ambulatory Visit (HOSPITAL_BASED_OUTPATIENT_CLINIC_OR_DEPARTMENT_OTHER): Payer: Medicare Other | Attending: Orthopedic Surgery | Admitting: Physical Therapy

## 2022-07-16 DIAGNOSIS — R262 Difficulty in walking, not elsewhere classified: Secondary | ICD-10-CM | POA: Diagnosis not present

## 2022-07-16 DIAGNOSIS — M6281 Muscle weakness (generalized): Secondary | ICD-10-CM | POA: Insufficient documentation

## 2022-07-16 DIAGNOSIS — Z9181 History of falling: Secondary | ICD-10-CM | POA: Insufficient documentation

## 2022-07-16 DIAGNOSIS — R2681 Unsteadiness on feet: Secondary | ICD-10-CM | POA: Insufficient documentation
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
## 2022-07-20 ENCOUNTER — Ambulatory Visit (HOSPITAL_BASED_OUTPATIENT_CLINIC_OR_DEPARTMENT_OTHER): Payer: Medicare Other | Admitting: Physical Therapy

## 2022-07-20 ENCOUNTER — Encounter (HOSPITAL_BASED_OUTPATIENT_CLINIC_OR_DEPARTMENT_OTHER): Payer: Self-pay | Admitting: Physical Therapy

## 2022-07-20 DIAGNOSIS — R2681 Unsteadiness on feet: Secondary | ICD-10-CM

## 2022-07-20 DIAGNOSIS — M6281 Muscle weakness (generalized): Secondary | ICD-10-CM

## 2022-07-20 DIAGNOSIS — R262 Difficulty in walking, not elsewhere classified: Secondary | ICD-10-CM | POA: Diagnosis not present

## 2022-07-20 DIAGNOSIS — Z9181 History of falling: Secondary | ICD-10-CM | POA: Diagnosis not present

## 2022-07-21 ENCOUNTER — Emergency Department: Payer: Medicare Other

## 2022-07-21 ENCOUNTER — Encounter: Payer: Self-pay | Admitting: Emergency Medicine

## 2022-07-21 ENCOUNTER — Other Ambulatory Visit: Payer: Self-pay

## 2022-07-21 ENCOUNTER — Telehealth: Payer: Self-pay | Admitting: Family Medicine

## 2022-07-21 ENCOUNTER — Emergency Department
Admission: RE | Admit: 2022-07-21 | Discharge: 2022-07-21 | Disposition: A | Discharge status: Home / Self Care | Payer: Medicare Other | Source: Ambulatory Visit | Attending: Orthopedic Surgery | Admitting: Orthopedic Surgery

## 2022-07-21 ENCOUNTER — Emergency Department
Admission: EM | Admit: 2022-07-21 | Discharge: 2022-07-21 | Disposition: A | Discharge status: Home / Self Care | Payer: Medicare Other | Attending: Emergency Medicine | Admitting: Emergency Medicine

## 2022-07-21 ENCOUNTER — Ambulatory Visit (HOSPITAL_BASED_OUTPATIENT_CLINIC_OR_DEPARTMENT_OTHER): Payer: Self-pay | Admitting: Physical Therapy

## 2022-07-21 ENCOUNTER — Ambulatory Visit: Payer: Medicare Other | Admitting: Family Medicine

## 2022-07-21 DIAGNOSIS — M25562 Pain in left knee: Secondary | ICD-10-CM

## 2022-07-21 DIAGNOSIS — M1732 Unilateral post-traumatic osteoarthritis, left knee: Secondary | ICD-10-CM | POA: Insufficient documentation

## 2022-07-21 DIAGNOSIS — E162 Hypoglycemia, unspecified: Secondary | ICD-10-CM

## 2022-07-21 DIAGNOSIS — R0789 Other chest pain: Secondary | ICD-10-CM | POA: Diagnosis not present

## 2022-07-21 DIAGNOSIS — Z794 Long term (current) use of insulin: Secondary | ICD-10-CM

## 2022-07-21 DIAGNOSIS — E1165 Type 2 diabetes mellitus with hyperglycemia: Secondary | ICD-10-CM | POA: Diagnosis not present

## 2022-07-21 DIAGNOSIS — R296 Repeated falls: Secondary | ICD-10-CM | POA: Diagnosis not present

## 2022-07-21 DIAGNOSIS — M25462 Effusion, left knee: Secondary | ICD-10-CM | POA: Diagnosis not present

## 2022-07-21 DIAGNOSIS — R079 Chest pain, unspecified: Secondary | ICD-10-CM | POA: Diagnosis not present

## 2022-07-21 LAB — BASIC METABOLIC PANEL
Anion gap: 7 (ref 5–15)
BUN: 32 mg/dL — ABNORMAL HIGH (ref 6–20)
CO2: 23 mmol/L (ref 22–32)
Calcium: 9.6 mg/dL (ref 8.9–10.3)
Chloride: 105 mmol/L (ref 98–111)
Creatinine, Ser: 1.05 mg/dL (ref 0.61–1.24)
GFR, Estimated: >60 mL/min (ref >60)
Glucose, Bld: 206 mg/dL — ABNORMAL HIGH (ref 70–99)
Potassium: 4 mmol/L (ref 3.5–5.1)
Sodium: 135 mmol/L (ref 135–145)

## 2022-07-21 LAB — CBC
HCT: 46.9 % (ref 39.0–52.0)
Hemoglobin: 15.5 g/dL (ref 13.0–17.0)
MCH: 30.6 pg (ref 26.0–34.0)
MCHC: 33 g/dL (ref 30.0–36.0)
MCV: 92.5 fL (ref 80.0–100.0)
Platelets: 434 10*3/uL — ABNORMAL HIGH (ref 150–400)
RBC: 5.07 MIL/uL (ref 4.22–5.81)
RDW: 12.6 % (ref 11.5–15.5)
WBC: 10 10*3/uL (ref 4.0–10.5)
nRBC: 0 % (ref 0.0–0.2)

## 2022-07-21 LAB — TROPONIN I (HIGH SENSITIVITY): Troponin I (High Sensitivity): 8 ng/L (ref <18)

## 2022-07-21 MED ORDER — DEXCOM G6 SENSOR MISC: 12 refills | Status: DC

## 2022-07-21 MED ORDER — DEXCOM G6 TRANSMITTER MISC: 3 refills | Status: DC

## 2022-07-21 NOTE — ED Triage Notes (Signed)
C/O fall three weeks ago and left side rib pain.  States this morning fell in the bathroom due to left leg buckling.  Arrives to ED c/o left chest pain since fall.  Also states blood pressure elevated.  Patient is anxious.  Was at hospital for left knee CT scan, out patient.  AAOx3.  Skin warm and dry.
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
## 2022-07-21 NOTE — ED Notes (Signed)
Pt states that the chest pain has gone away but his knee pain is the prominent pain currently.

## 2022-07-21 NOTE — Telephone Encounter (Signed)
Pt stated he is in the ER due to a fall. pt had an appointment this morning but cannot come in. Pt stated Rx needs to be sent to [facility] for his Dexcom G6 glucose monitor. Pt stated the transmitter will be going out in about a week.  [facility] Fax: [phone]  Please advise.

## 2022-07-21 NOTE — ED Notes (Signed)
Back chart, Pt discharged at [place]

## 2022-07-21 NOTE — Telephone Encounter (Signed)
Printed rx for [employer].  Please fax printed signed orders to [facility]  Nobie Putnam, [employer] 07/21/2022, 12:03 PM

## 2022-07-23 ENCOUNTER — Encounter: Payer: Self-pay | Admitting: Physical Therapy

## 2022-07-23 ENCOUNTER — Ambulatory Visit: Payer: Medicare Other | Attending: Student | Admitting: Physical Therapy

## 2022-07-23 DIAGNOSIS — R2681 Unsteadiness on feet: Secondary | ICD-10-CM | POA: Insufficient documentation

## 2022-07-23 DIAGNOSIS — Z9181 History of falling: Secondary | ICD-10-CM | POA: Diagnosis not present

## 2022-07-23 DIAGNOSIS — R262 Difficulty in walking, not elsewhere classified: Secondary | ICD-10-CM | POA: Diagnosis not present

## 2022-07-23 DIAGNOSIS — M6281 Muscle weakness (generalized): Secondary | ICD-10-CM | POA: Diagnosis not present
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
## 2022-07-24 DIAGNOSIS — M25562 Pain in left knee: Secondary | ICD-10-CM | POA: Diagnosis not present

## 2022-07-24 DIAGNOSIS — M1732 Unilateral post-traumatic osteoarthritis, left knee: Secondary | ICD-10-CM | POA: Diagnosis not present

## 2022-07-27 NOTE — Telephone Encounter (Signed)
Pt is calling back stated [facility] never received fax.  Please resend fax for Rx for [employer].   [facility] Fax: [phone]   Please advise.

## 2022-07-29 ENCOUNTER — Encounter: Payer: Self-pay | Admitting: Physical Therapy

## 2022-07-29 ENCOUNTER — Ambulatory Visit: Payer: Medicare Other | Admitting: Physical Therapy

## 2022-07-29 VITALS — BP 154/98 | HR 97

## 2022-07-29 DIAGNOSIS — R262 Difficulty in walking, not elsewhere classified: Secondary | ICD-10-CM

## 2022-07-29 DIAGNOSIS — M6281 Muscle weakness (generalized): Secondary | ICD-10-CM | POA: Diagnosis not present

## 2022-07-29 DIAGNOSIS — Z9181 History of falling: Secondary | ICD-10-CM | POA: Diagnosis not present

## 2022-07-29 DIAGNOSIS — R2681 Unsteadiness on feet: Secondary | ICD-10-CM | POA: Diagnosis not present
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
## 2022-07-30 NOTE — Telephone Encounter (Signed)
It has been faxed.  

## 2022-08-10 ENCOUNTER — Encounter (INDEPENDENT_AMBULATORY_CARE_PROVIDER_SITE_OTHER): Payer: Self-pay
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
## 2022-08-12 ENCOUNTER — Encounter (HOSPITAL_BASED_OUTPATIENT_CLINIC_OR_DEPARTMENT_OTHER): Payer: Self-pay | Admitting: Physical Therapy

## 2022-08-12 ENCOUNTER — Telehealth: Payer: Self-pay | Admitting: Pharmacist

## 2022-08-12 ENCOUNTER — Ambulatory Visit (HOSPITAL_BASED_OUTPATIENT_CLINIC_OR_DEPARTMENT_OTHER): Payer: Medicare Other | Attending: Orthopedic Surgery | Admitting: Physical Therapy

## 2022-08-12 DIAGNOSIS — R262 Difficulty in walking, not elsewhere classified: Secondary | ICD-10-CM | POA: Insufficient documentation

## 2022-08-12 DIAGNOSIS — R2681 Unsteadiness on feet: Secondary | ICD-10-CM | POA: Diagnosis not present

## 2022-08-12 DIAGNOSIS — M6281 Muscle weakness (generalized): Secondary | ICD-10-CM | POA: Diagnosis not present

## 2022-08-12 DIAGNOSIS — Z9181 History of falling: Secondary | ICD-10-CM | POA: Insufficient documentation

## 2022-08-12 NOTE — Telephone Encounter (Signed)
   Outreach Note  08/12/2022 Name: Shawn Rivas MRN: [id number] DOB: 06-06-1966  This patient is appearing on the insurance-provided list for being at risk of failing the adherence measure for Statin Use in Persons with Diabetes (SUPD) medications this calendar year.   Per review of Office Visit encounter on 05/26/2022, PCP advised patient to return in about 4 months (around 09/25/2022) for 4 month follow-up DM A1c  Latest lipid panel results:  Lab Results  Component Value Date   CHOL 215 (H) 05/26/2022   HDL 49 05/26/2022   LDLCALC 143 (H) 05/26/2022   TRIG 109 05/26/2022   CHOLHDL 4.4 05/26/2022    Plan:  Will collaborate with PCP regarding recommendation for patient to restart statin therapy.   Wallace Cullens, PharmD, [place] [phone]

## 2022-08-14 ENCOUNTER — Encounter (HOSPITAL_BASED_OUTPATIENT_CLINIC_OR_DEPARTMENT_OTHER): Payer: Self-pay | Admitting: Physical Therapy

## 2022-08-14 ENCOUNTER — Ambulatory Visit (HOSPITAL_BASED_OUTPATIENT_CLINIC_OR_DEPARTMENT_OTHER): Payer: Medicare Other | Admitting: Physical Therapy

## 2022-08-14 DIAGNOSIS — R262 Difficulty in walking, not elsewhere classified: Secondary | ICD-10-CM | POA: Diagnosis not present

## 2022-08-14 DIAGNOSIS — R2681 Unsteadiness on feet: Secondary | ICD-10-CM

## 2022-08-14 DIAGNOSIS — M6281 Muscle weakness (generalized): Secondary | ICD-10-CM | POA: Diagnosis not present

## 2022-08-14 DIAGNOSIS — Z9181 History of falling: Secondary | ICD-10-CM | POA: Diagnosis not present
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
## 2022-08-19 ENCOUNTER — Ambulatory Visit (HOSPITAL_BASED_OUTPATIENT_CLINIC_OR_DEPARTMENT_OTHER): Payer: Medicare Other | Admitting: Physical Therapy

## 2022-08-19 ENCOUNTER — Encounter (HOSPITAL_BASED_OUTPATIENT_CLINIC_OR_DEPARTMENT_OTHER): Payer: Self-pay | Admitting: Physical Therapy

## 2022-08-19 DIAGNOSIS — R2681 Unsteadiness on feet: Secondary | ICD-10-CM

## 2022-08-19 DIAGNOSIS — Z9181 History of falling: Secondary | ICD-10-CM | POA: Diagnosis not present

## 2022-08-19 DIAGNOSIS — M6281 Muscle weakness (generalized): Secondary | ICD-10-CM

## 2022-08-19 DIAGNOSIS — R262 Difficulty in walking, not elsewhere classified: Secondary | ICD-10-CM | POA: Diagnosis not present
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
## 2022-08-20 LAB — HM DIABETES EYE EXAM

## 2022-08-21 ENCOUNTER — Ambulatory Visit (HOSPITAL_BASED_OUTPATIENT_CLINIC_OR_DEPARTMENT_OTHER): Payer: Medicare Other | Admitting: Physical Therapy

## 2022-08-21 ENCOUNTER — Encounter (HOSPITAL_BASED_OUTPATIENT_CLINIC_OR_DEPARTMENT_OTHER): Payer: Self-pay | Admitting: Physical Therapy

## 2022-08-21 DIAGNOSIS — R2681 Unsteadiness on feet: Secondary | ICD-10-CM | POA: Diagnosis not present

## 2022-08-21 DIAGNOSIS — R262 Difficulty in walking, not elsewhere classified: Secondary | ICD-10-CM | POA: Diagnosis not present

## 2022-08-21 DIAGNOSIS — M6281 Muscle weakness (generalized): Secondary | ICD-10-CM

## 2022-08-21 DIAGNOSIS — Z9181 History of falling: Secondary | ICD-10-CM | POA: Diagnosis not present

## 2022-08-21 DIAGNOSIS — E1165 Type 2 diabetes mellitus with hyperglycemia: Secondary | ICD-10-CM | POA: Diagnosis not present
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
## 2022-08-26 ENCOUNTER — Ambulatory Visit (HOSPITAL_BASED_OUTPATIENT_CLINIC_OR_DEPARTMENT_OTHER): Payer: Medicare Other | Admitting: Physical Therapy

## 2022-08-26 ENCOUNTER — Encounter (HOSPITAL_BASED_OUTPATIENT_CLINIC_OR_DEPARTMENT_OTHER): Payer: Self-pay | Admitting: Physical Therapy

## 2022-08-26 ENCOUNTER — Encounter: Payer: Self-pay | Admitting: Family Medicine

## 2022-08-26 DIAGNOSIS — R2681 Unsteadiness on feet: Secondary | ICD-10-CM | POA: Diagnosis not present

## 2022-08-26 DIAGNOSIS — M6281 Muscle weakness (generalized): Secondary | ICD-10-CM

## 2022-08-26 DIAGNOSIS — E113212 Type 2 diabetes mellitus with mild nonproliferative diabetic retinopathy with macular edema, left eye: Secondary | ICD-10-CM | POA: Insufficient documentation

## 2022-08-26 DIAGNOSIS — Z9181 History of falling: Secondary | ICD-10-CM

## 2022-08-26 DIAGNOSIS — R262 Difficulty in walking, not elsewhere classified: Secondary | ICD-10-CM

## 2022-08-26 DIAGNOSIS — E113291 Type 2 diabetes mellitus with mild nonproliferative diabetic retinopathy without macular edema, right eye: Secondary | ICD-10-CM | POA: Insufficient documentation
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
## 2022-08-28 ENCOUNTER — Ambulatory Visit (HOSPITAL_BASED_OUTPATIENT_CLINIC_OR_DEPARTMENT_OTHER): Payer: Medicare Other | Admitting: Physical Therapy

## 2022-08-28 ENCOUNTER — Encounter (HOSPITAL_BASED_OUTPATIENT_CLINIC_OR_DEPARTMENT_OTHER): Payer: Self-pay | Admitting: Physical Therapy

## 2022-08-28 DIAGNOSIS — M6281 Muscle weakness (generalized): Secondary | ICD-10-CM | POA: Diagnosis not present

## 2022-08-28 DIAGNOSIS — R262 Difficulty in walking, not elsewhere classified: Secondary | ICD-10-CM | POA: Diagnosis not present

## 2022-08-28 DIAGNOSIS — R2681 Unsteadiness on feet: Secondary | ICD-10-CM | POA: Diagnosis not present

## 2022-08-28 DIAGNOSIS — Z9181 History of falling: Secondary | ICD-10-CM
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
## 2022-08-31 ENCOUNTER — Encounter (HOSPITAL_BASED_OUTPATIENT_CLINIC_OR_DEPARTMENT_OTHER): Payer: Self-pay | Admitting: Physical Therapy

## 2022-08-31 ENCOUNTER — Ambulatory Visit (HOSPITAL_BASED_OUTPATIENT_CLINIC_OR_DEPARTMENT_OTHER): Payer: Medicare Other | Admitting: Physical Therapy

## 2022-08-31 DIAGNOSIS — Z9181 History of falling: Secondary | ICD-10-CM | POA: Diagnosis not present

## 2022-08-31 DIAGNOSIS — M6281 Muscle weakness (generalized): Secondary | ICD-10-CM

## 2022-08-31 DIAGNOSIS — R2681 Unsteadiness on feet: Secondary | ICD-10-CM | POA: Diagnosis not present

## 2022-08-31 DIAGNOSIS — R262 Difficulty in walking, not elsewhere classified: Secondary | ICD-10-CM | POA: Diagnosis not present
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
## 2022-09-02 ENCOUNTER — Ambulatory Visit (HOSPITAL_BASED_OUTPATIENT_CLINIC_OR_DEPARTMENT_OTHER): Payer: Medicare Other | Admitting: Physical Therapy

## 2022-09-02 ENCOUNTER — Ambulatory Visit (HOSPITAL_BASED_OUTPATIENT_CLINIC_OR_DEPARTMENT_OTHER): Payer: Self-pay | Admitting: Physical Therapy

## 2022-09-02 ENCOUNTER — Encounter (HOSPITAL_BASED_OUTPATIENT_CLINIC_OR_DEPARTMENT_OTHER): Payer: Self-pay | Admitting: Physical Therapy

## 2022-09-02 DIAGNOSIS — M6281 Muscle weakness (generalized): Secondary | ICD-10-CM | POA: Diagnosis not present

## 2022-09-02 DIAGNOSIS — R262 Difficulty in walking, not elsewhere classified: Secondary | ICD-10-CM | POA: Diagnosis not present

## 2022-09-02 DIAGNOSIS — R2681 Unsteadiness on feet: Secondary | ICD-10-CM

## 2022-09-02 DIAGNOSIS — Z9181 History of falling: Secondary | ICD-10-CM
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
## 2022-09-07 ENCOUNTER — Ambulatory Visit (HOSPITAL_BASED_OUTPATIENT_CLINIC_OR_DEPARTMENT_OTHER): Payer: Medicare Other | Admitting: Physical Therapy

## 2022-09-07 ENCOUNTER — Encounter (HOSPITAL_BASED_OUTPATIENT_CLINIC_OR_DEPARTMENT_OTHER): Payer: Self-pay | Admitting: Physical Therapy

## 2022-09-07 DIAGNOSIS — M6281 Muscle weakness (generalized): Secondary | ICD-10-CM | POA: Diagnosis not present

## 2022-09-07 DIAGNOSIS — R2681 Unsteadiness on feet: Secondary | ICD-10-CM | POA: Diagnosis not present

## 2022-09-07 DIAGNOSIS — Z9181 History of falling: Secondary | ICD-10-CM | POA: Diagnosis not present

## 2022-09-07 DIAGNOSIS — R262 Difficulty in walking, not elsewhere classified: Secondary | ICD-10-CM
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
## 2022-09-09 ENCOUNTER — Ambulatory Visit: Payer: Medicare Other | Attending: Student | Admitting: Physical Therapy

## 2022-09-09 ENCOUNTER — Encounter: Payer: Self-pay | Admitting: Physical Therapy

## 2022-09-09 VITALS — BP 141/79 | HR 82

## 2022-09-09 DIAGNOSIS — M6281 Muscle weakness (generalized): Secondary | ICD-10-CM | POA: Insufficient documentation

## 2022-09-09 DIAGNOSIS — R262 Difficulty in walking, not elsewhere classified: Secondary | ICD-10-CM | POA: Insufficient documentation

## 2022-09-09 DIAGNOSIS — R2681 Unsteadiness on feet: Secondary | ICD-10-CM | POA: Insufficient documentation

## 2022-09-09 DIAGNOSIS — Z9181 History of falling: Secondary | ICD-10-CM | POA: Diagnosis not present
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
## 2022-09-14 ENCOUNTER — Ambulatory Visit: Payer: Medicare Other | Attending: Student | Admitting: Physical Therapy

## 2022-09-14 ENCOUNTER — Encounter: Payer: Self-pay | Admitting: Physical Therapy

## 2022-09-14 VITALS — BP 140/78 | HR 81

## 2022-09-14 DIAGNOSIS — Z9181 History of falling: Secondary | ICD-10-CM | POA: Insufficient documentation

## 2022-09-14 DIAGNOSIS — R2681 Unsteadiness on feet: Secondary | ICD-10-CM | POA: Insufficient documentation

## 2022-09-14 DIAGNOSIS — M6281 Muscle weakness (generalized): Secondary | ICD-10-CM | POA: Diagnosis not present

## 2022-09-14 DIAGNOSIS — R262 Difficulty in walking, not elsewhere classified: Secondary | ICD-10-CM | POA: Diagnosis not present
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
## 2022-09-16 ENCOUNTER — Ambulatory Visit (HOSPITAL_BASED_OUTPATIENT_CLINIC_OR_DEPARTMENT_OTHER): Payer: Medicare Other | Admitting: Physical Therapy

## 2022-09-18 ENCOUNTER — Ambulatory Visit (HOSPITAL_BASED_OUTPATIENT_CLINIC_OR_DEPARTMENT_OTHER): Payer: Medicare Other | Admitting: Physical Therapy

## 2022-09-20 DIAGNOSIS — E1165 Type 2 diabetes mellitus with hyperglycemia: Secondary | ICD-10-CM | POA: Diagnosis not present

## 2022-09-21 ENCOUNTER — Encounter: Payer: Self-pay | Admitting: Physical Therapy

## 2022-09-21 ENCOUNTER — Ambulatory Visit: Payer: Medicare Other | Admitting: Physical Therapy

## 2022-09-21 DIAGNOSIS — R262 Difficulty in walking, not elsewhere classified: Secondary | ICD-10-CM | POA: Diagnosis not present

## 2022-09-21 DIAGNOSIS — R2681 Unsteadiness on feet: Secondary | ICD-10-CM | POA: Diagnosis not present

## 2022-09-21 DIAGNOSIS — Z9181 History of falling: Secondary | ICD-10-CM | POA: Diagnosis not present

## 2022-09-21 DIAGNOSIS — M6281 Muscle weakness (generalized): Secondary | ICD-10-CM | POA: Diagnosis not present
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
## 2022-09-23 ENCOUNTER — Encounter (HOSPITAL_BASED_OUTPATIENT_CLINIC_OR_DEPARTMENT_OTHER): Payer: Self-pay | Admitting: Physical Therapy

## 2022-09-23 ENCOUNTER — Ambulatory Visit (HOSPITAL_BASED_OUTPATIENT_CLINIC_OR_DEPARTMENT_OTHER): Payer: Medicare Other | Attending: Orthopedic Surgery | Admitting: Physical Therapy

## 2022-09-23 DIAGNOSIS — M6281 Muscle weakness (generalized): Secondary | ICD-10-CM

## 2022-09-23 DIAGNOSIS — R2681 Unsteadiness on feet: Secondary | ICD-10-CM

## 2022-09-23 DIAGNOSIS — R262 Difficulty in walking, not elsewhere classified: Secondary | ICD-10-CM
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
## 2022-09-25 ENCOUNTER — Ambulatory Visit (HOSPITAL_BASED_OUTPATIENT_CLINIC_OR_DEPARTMENT_OTHER): Payer: Medicare Other | Admitting: Physical Therapy

## 2022-09-28 ENCOUNTER — Ambulatory Visit: Payer: Medicare Other | Admitting: Family Medicine

## 2022-09-28 ENCOUNTER — Ambulatory Visit: Payer: Medicare Other | Admitting: Physical Therapy

## 2022-09-28 ENCOUNTER — Telehealth: Payer: Self-pay | Admitting: Family Medicine

## 2022-09-28 NOTE — Telephone Encounter (Signed)
12/18 pt was late and was asked to r/s he refused and walked out w/out r/s'ing///ltd

## 2022-09-30 ENCOUNTER — Encounter: Payer: Medicare Other | Admitting: Physical Therapy

## 2022-09-30 ENCOUNTER — Ambulatory Visit (HOSPITAL_BASED_OUTPATIENT_CLINIC_OR_DEPARTMENT_OTHER): Payer: Medicare Other | Admitting: Physical Therapy

## 2022-09-30 ENCOUNTER — Encounter (HOSPITAL_BASED_OUTPATIENT_CLINIC_OR_DEPARTMENT_OTHER): Payer: Self-pay | Admitting: Physical Therapy

## 2022-09-30 DIAGNOSIS — R2681 Unsteadiness on feet: Secondary | ICD-10-CM | POA: Diagnosis not present

## 2022-09-30 DIAGNOSIS — M6281 Muscle weakness (generalized): Secondary | ICD-10-CM

## 2022-09-30 DIAGNOSIS — R262 Difficulty in walking, not elsewhere classified: Secondary | ICD-10-CM
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
## 2022-10-01 DIAGNOSIS — M2012 Hallux valgus (acquired), left foot: Secondary | ICD-10-CM | POA: Diagnosis not present

## 2022-10-01 DIAGNOSIS — E1140 Type 2 diabetes mellitus with diabetic neuropathy, unspecified: Secondary | ICD-10-CM | POA: Diagnosis not present

## 2022-10-01 DIAGNOSIS — Z872 Personal history of diseases of the skin and subcutaneous tissue: Secondary | ICD-10-CM | POA: Diagnosis not present

## 2022-10-01 DIAGNOSIS — B351 Tinea unguium: Secondary | ICD-10-CM | POA: Diagnosis not present

## 2022-10-01 DIAGNOSIS — Z794 Long term (current) use of insulin: Secondary | ICD-10-CM | POA: Diagnosis not present

## 2022-10-02 ENCOUNTER — Ambulatory Visit (HOSPITAL_BASED_OUTPATIENT_CLINIC_OR_DEPARTMENT_OTHER): Payer: Medicare Other | Admitting: Physical Therapy

## 2022-10-07 ENCOUNTER — Ambulatory Visit (HOSPITAL_BASED_OUTPATIENT_CLINIC_OR_DEPARTMENT_OTHER): Payer: Medicare Other | Admitting: Physical Therapy

## 2022-10-08 ENCOUNTER — Telehealth: Payer: Self-pay | Admitting: Family Medicine

## 2022-10-08 NOTE — Telephone Encounter (Signed)
Left message for patient to call back and schedule Medicare Annual Wellness Visit (AWV) to be done virtually or by telephone.  No hx of AWV eligible as of 10/12/09  Please schedule at anytime with [facility]-Nurse Health Advisor.      45 Minutes appointment   Any questions, please call me at [phone] 

## 2022-10-09 ENCOUNTER — Ambulatory Visit (HOSPITAL_BASED_OUTPATIENT_CLINIC_OR_DEPARTMENT_OTHER): Payer: Medicare Other | Admitting: Physical Therapy

## 2022-10-21 DIAGNOSIS — E1165 Type 2 diabetes mellitus with hyperglycemia: Secondary | ICD-10-CM | POA: Diagnosis not present

## 2022-11-21 DIAGNOSIS — E1165 Type 2 diabetes mellitus with hyperglycemia: Secondary | ICD-10-CM | POA: Diagnosis not present

## 2022-12-20 DIAGNOSIS — E1165 Type 2 diabetes mellitus with hyperglycemia: Secondary | ICD-10-CM | POA: Diagnosis not present

## 2022-12-25 ENCOUNTER — Telehealth: Payer: Self-pay | Admitting: Family Medicine

## 2022-12-25 NOTE — Telephone Encounter (Signed)
Called patient to schedule Medicare Annual Wellness Visit (AWV). Left message for patient to call back and schedule Medicare Annual Wellness Visit (AWV).  Last date of AWV: NONE  Please schedule an appointment at any time with Kirke Shaggy, LPN .  If any questions, please contact me.  Thank you ,  Sherol Dade; [place] Direct Dial: [phone]

## 2023-01-12 ENCOUNTER — Telehealth: Payer: Self-pay | Admitting: Family Medicine

## 2023-01-12 NOTE — Telephone Encounter (Signed)
Please contact patient to schedule a 6 month follow-up Diabetes visit to check A1c.  We received a fax from his diabetic supplier that he will need to be seen at office visit and have updated A1c and follow-up to approve his CGM Glucose Monitor device by the insurance.  Nobie Putnam, [facility] 01/12/2023, 5:09 PM

## 2023-01-13 NOTE — Telephone Encounter (Signed)
Called patient and left a message to call back and make an appointment.  Also, sent him a MyChart message with the same information.

## 2023-01-18 NOTE — Telephone Encounter (Signed)
Left patient another message to call and make 6 month DM follow up.

## 2023-01-20 DIAGNOSIS — E1165 Type 2 diabetes mellitus with hyperglycemia: Secondary | ICD-10-CM | POA: Diagnosis not present

## 2023-01-25 NOTE — Telephone Encounter (Signed)
Will address at patient appointment.

## 2023-01-25 NOTE — Telephone Encounter (Addendum)
Morrie Sheldon from [employer] stated they provide patient with diabetic supplies.  Stated updated office notes are needed. Mentioned notes need to be updated every 6 months.  Please advise.

## 2023-01-29 ENCOUNTER — Ambulatory Visit (INDEPENDENT_AMBULATORY_CARE_PROVIDER_SITE_OTHER): Payer: Medicare Other | Admitting: Family Medicine

## 2023-01-29 ENCOUNTER — Encounter: Payer: Self-pay | Admitting: Family Medicine

## 2023-01-29 VITALS — BP 154/108 | HR 90 | Resp 17 | Ht 70.0 in | Wt 229.4 lb

## 2023-01-29 DIAGNOSIS — G8921 Chronic pain due to trauma: Secondary | ICD-10-CM

## 2023-01-29 DIAGNOSIS — E1169 Type 2 diabetes mellitus with other specified complication: Secondary | ICD-10-CM | POA: Diagnosis not present

## 2023-01-29 DIAGNOSIS — R350 Frequency of micturition: Secondary | ICD-10-CM

## 2023-01-29 DIAGNOSIS — N401 Enlarged prostate with lower urinary tract symptoms: Secondary | ICD-10-CM

## 2023-01-29 DIAGNOSIS — I10 Essential (primary) hypertension: Secondary | ICD-10-CM

## 2023-01-29 DIAGNOSIS — R7989 Other specified abnormal findings of blood chemistry: Secondary | ICD-10-CM | POA: Diagnosis not present

## 2023-01-29 DIAGNOSIS — E113212 Type 2 diabetes mellitus with mild nonproliferative diabetic retinopathy with macular edema, left eye: Secondary | ICD-10-CM

## 2023-01-29 DIAGNOSIS — M153 Secondary multiple arthritis: Secondary | ICD-10-CM | POA: Diagnosis not present

## 2023-01-29 DIAGNOSIS — E1142 Type 2 diabetes mellitus with diabetic polyneuropathy: Secondary | ICD-10-CM

## 2023-01-29 DIAGNOSIS — M1992 Post-traumatic osteoarthritis, unspecified site: Secondary | ICD-10-CM | POA: Insufficient documentation

## 2023-01-29 DIAGNOSIS — I739 Peripheral vascular disease, unspecified: Secondary | ICD-10-CM

## 2023-01-29 DIAGNOSIS — F419 Anxiety disorder, unspecified: Secondary | ICD-10-CM

## 2023-01-29 DIAGNOSIS — Z794 Long term (current) use of insulin: Secondary | ICD-10-CM

## 2023-01-29 DIAGNOSIS — E785 Hyperlipidemia, unspecified: Secondary | ICD-10-CM

## 2023-01-29 DIAGNOSIS — Z125 Encounter for screening for malignant neoplasm of prostate: Secondary | ICD-10-CM

## 2023-01-29 NOTE — Patient Instructions (Addendum)
Thank you for coming to the office today.  Labs on Monday AM  We will order the meds after results come back likely Tuesday.  Please let us know which Pharmacy.  Tamsulosin 04.mg 30 pill daily Oxycodone IR  AS NEEDED q 4 hr 30 pills 0 refill Humalog insulin 20 units with meal twice a day 15mL Valsartan  daily 90 0  Buspar  THREE TIMES A DAY AS NEEDED anxiety 90 0 Lovastatin  daily 90 0  Gabapentin 100-300mg  THREE TIMES A DAY AS NEEDED 90 pills 0 refills  --------------  Fall of 2023, Diabetic Eye Screening exam was done, and it showed "Mild Nonproliferative Diabetic Retinopathy"  It is recommended to do a yearly evaluation by an Eye Doctor to discuss this further.  If it is still in mild range, then there is no treatment but yearly surveillance.  If it is more advanced, then they can discuss a treatment plan.  Have [employer] fax [place] a copy of that report [phone]  ---------------------------  [facility] [address] [place], [place]  [address] Ph: [phone] Fax: [phone]  [facility]. [address] [place], [place] [address] Ph: [phone] Fax: [phone]  [facility] [address] [place], [place] [address] Open until 5PM Phone: [phone] Fax: [phone]  [facility] [address] [place], [place] [address] Ph [phone] Fax [phone]    Please schedule a Follow-up Appointment to: Return in about 4 months (around 05/31/2023) for 4/22 Mon 9am fasting lab only - then follow up 4 month DM A1c, HTN updates.  If you have any other questions or concerns, please feel free to call the office or send a message through MyChart. You may also schedule an earlier appointment if necessary.  Additionally, you may be receiving a survey about your experience at our office within a few days to 1 week by e-mail or  mail. We value your feedback.  Saralyn Pilar, DO [facility] [place]

## 2023-01-29 NOTE — Progress Notes (Unsigned)
Subjective:    Patient ID: Shawn Rivas, male    DOB: 03-03-1966, 57 y.o.   MRN: [id number]  Shawn Rivas is a 57 y.o. male presenting on 01/29/2023 for Diabetes (Follow up and Dm order Aroflow. )   HPI  Diabetes, Type 2 Previously on strict keto / carnivore diet and it had controlled CBG / Blood pressure. Now off diet A1c down to 6.1 previously on lifestyle CGM prior results 150-160s in controlled range. Now recently off diet has been 200-300+ Off Metformin Farxiga Ozempic Lantus Daily using Humalog 20 in AM with breakfast maybe PM dose if need  In office DM Eye exam, dx with Diabetic Retinopathy He was not given results however. [employer]  Needs CGM DM supplies, through [employer]. Will complete paperwork  CHRONIC HTN: Reports elevated BP lately he has discontinued all medication Current Meds - none (off Metoprolol XL 25mg , Losartan 100mg  dailyi   Did not take meds Lifestyle: - Diet: no longer following carnivore diet Denies CP, dyspnea, HA, edema, dizziness / lightheadedness  BPH Urgency / vs OAB  30+ years of problem Following traumatic injuries fractures from accident He has had difficulty with his bladder dysfunction but has not been treated or diagnosed before  AUA BPH Symptom Score over past 1 month 1. Sensation of not emptying bladder post void - 1 2. Urinate less than 2 hour after finish last void - 5 3. Start/Stop several times during void - 2 4. Difficult to postpone urination - 5 5. Weak urinary stream - 1 6. Push or strain urination - 2 7. Nocturia - 2 times  Total Score: 16 (Moderate BPH symptoms)   Post Traumatic Osteoarthritis Chronic Pain  Due to limitations with mobility due to joint pain Requesting DME Equipment.  Needs Nitro DLX Foldable Rollator lightweight seated titanium walker "[employer]" brand  Has used Oxycodone 5mg  AS NEEDED only if pain. Takes about 1 x per week or few times per month only. Previously  managed by [facility] with pain medication, now he is requesting management local through PCP  Has tried Flexeril, Gabapentin, NSAIDs, Tylenol.  Anxiety He takes Alprazolam very rarely, has rx that would last several months or longer No longer followed by mental health / neurology Requesting re order on this. Has not had new rx in long time. Asking about other med options for anxiety      01/29/2023    2:21 PM 02/20/2022   10:05 AM  Depression screen PHQ 2/9  Decreased Interest 3 1  Down, Depressed, Hopeless 2 1  PHQ - 2 Score 5 2  Altered sleeping 3 3  Tired, decreased energy 2 1  Change in appetite 1 1  Feeling bad or failure about yourself  0 2  Trouble concentrating 3 3  Moving slowly or fidgety/restless 0 2  Suicidal thoughts 0 0  PHQ-9 Score 14 14  Difficult doing work/chores Very difficult Extremely dIfficult      01/29/2023    2:23 PM 02/20/2022   10:05 AM  GAD 7 : Generalized Anxiety Score  Nervous, Anxious, on Edge 2 3  Control/stop worrying 2 1  Worry too much - different things 2 1  Trouble relaxing 2 3  Restless 0 0  Easily annoyed or irritable 3 2  Afraid - awful might happen 0 0  Total GAD 7 Score 11 10  Anxiety Difficulty Very difficult Somewhat difficult      Social History   Tobacco Use   Smoking  status: Former    Packs/day: 3.00    Years: 20.00    Additional pack years: 0.00    Total pack years: 60.00    Types: Cigarettes    Quit date: 10/12/2010    Years since quitting: 12.3   Smokeless tobacco: Never  Vaping Use   Vaping Use: Never used  Substance Use Topics   Alcohol use: Yes   Drug use: No    Review of Systems Per HPI unless specifically indicated above     Objective:    BP (!) 154/108 (BP Location: Left Arm, Cuff Size: Normal)   Pulse 90   Resp 17   Ht 5\' 10"  (1.778 m)   Wt 229 lb 6.4 oz (104.1 kg)   BMI 32.92 kg/m   Wt Readings from Last 3 Encounters:  01/29/23 229 lb 6.4 oz (104.1 kg)  07/21/22 219 lb 5.7 oz  (99.5 kg)  05/26/22 219 lb 6.4 oz (99.5 kg)    Physical Exam Vitals and nursing note reviewed.  Constitutional:      General: He is not in acute distress.    Appearance: He is well-developed. He is not diaphoretic.     Comments: Well-appearing, comfortable, cooperative  HENT:     Head: Normocephalic and atraumatic.  Eyes:     General:        Right eye: No discharge.        Left eye: No discharge.     Conjunctiva/sclera: Conjunctivae normal.  Neck:     Thyroid: No thyromegaly.  Cardiovascular:     Rate and Rhythm: Normal rate and regular rhythm.     Pulses: Normal pulses.     Heart sounds: Normal heart sounds. No murmur heard. Pulmonary:     Effort: Pulmonary effort is normal. No respiratory distress.     Breath sounds: Normal breath sounds. No wheezing or rales.  Musculoskeletal:     Cervical back: Normal range of motion and neck supple.     Comments: Limited mobility with range of motion low back lumbar spine and knees He is using walker  Lymphadenopathy:     Cervical: No cervical adenopathy.  Skin:    General: Skin is warm and dry.     Findings: No erythema or rash.  Neurological:     Mental Status: He is alert and oriented to person, place, and time. Mental status is at baseline.  Psychiatric:        Behavior: Behavior normal.     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 determined by examination associated with type 2 diabetes mellitus   Diabetic polyneuropathy associated with type 2 diabetes mellitus   Essential hypertension   Hyperlipidemia due to type 2 diabetes mellitus    Relevant Orders   Lipid panel   TSH   Morbidly obese   Peripheral vascular disease   Post-traumatic osteoarthritis   Type 2 diabetes mellitus with other specified complication - Primary   Relevant Orders   COMPLETE METABOLIC PANEL WITH GFR   CBC with Differential/Platelet   Hemoglobin A1c  Other Visit Diagnoses     Low testosterone       Relevant Orders   Testosterone   Anxiety       Screening PSA (prostate specific antigen)       Relevant Orders   PSA   Benign prostatic hyperplasia with urinary frequency           Lab orders placed. Will return next week for full fasting lab panel.  Pending lab results will order all medications we discussed today. Tamsulosin 0.4 mg 30 pill daily Oxycodone IR  AS NEEDED q 4 hr 30 pills 0 refill Humalog insulin 20 units with meal twice a day 15mL Valsartan  daily 90 0  Buspar  THREE TIMES A DAY AS NEEDED anxiety 90 0 Lovastatin  daily 90 0  Gabapentin 100-300mg  THREE TIMES A DAY AS NEEDED 90 pills 0 refills  Note he has self discontinued his medication since last visit.  Type 2 Diabetes Uncontrolled Complication non proliferative diabetic retinopathy, neuropathy Off keto diet He has CGM but needs new DM testing supplies to [employer], will complete form He is on insulin regimen, Humalog TWICE A DAY mealtime dosing and previously daily basal Lantus Anticipate restart Humalog and if indicated will add back basal, he was managing to keep A1c 6s with only mealtime insulin prior. Declines to retry GLP1 therapy has tried in past. Asked him to schedule w/ eye doctor now that he has had abnormal eye screening last year. Restart Statin therapy  Hypertension Off medication currently. Elevated BP reading Will re order medication next week.  Post Traumatic Osteoarthritis Chronic Pain Failed therapy previously On very infrequent opioid with Oxy IR   AS NEEDED Discuss that if he is taking more regularly and requires further  management, would refer to Pain Management  Anxiety Prior history of rare Xanax AS NEEDED Discussed goal to avoid this going forward if possible due to avoid taking Opioid + BDZ Recommended Buspar  THREE TIMES A DAY AS NEEDED option, he declined SSRI or daily  BPH vs Bladder Dysfunction History suggestive may have neurogenic bladder or difficulty with OAB vs overflow incontinence with urge. Question if some BPH LUTS contributing based on his scoring and history Will trial Tamsulosin first and if not effective, will pursue referral to Urology for consult.    Orders Placed This Encounter  Procedures   COMPLETE METABOLIC PANEL WITH GFR    Standing Status:   Future    Number of Occurrences:   1    Standing Expiration Date:   01/29/2024   CBC with Differential/Platelet    Standing Status:   Future    Number of Occurrences:   1    Standing Expiration Date:   01/29/2024   Hemoglobin A1c    Standing Status:   Future    Number of Occurrences:   1    Standing Expiration Date:   01/29/2024   Lipid panel    Standing Status:   Future    Number of Occurrences:   1    Standing Expiration Date:   01/29/2024    Order Specific Question:   Has the patient fasted?    Answer:   Yes   Testosterone    Standing Status:   Future    Number of Occurrences:   1    Standing Expiration Date:   01/29/2024   TSH    Standing Status:   Future    Number of Occurrences:   1    Standing Expiration  Date:   01/29/2024   PSA    Standing Status:   Future    Number of Occurrences:   1    Standing Expiration Date:   04/30/2023     No orders of the defined types were placed in this encounter.    Follow up plan: Return in about 4 months (around 05/31/2023) for 4/22 Mon 9am fasting lab only - then follow up 4 month DM A1c, HTN updates.  Labs ordered for next week.  Saralyn Pilar, DO [facility] 01/29/2023, 2:30 PM

## 2023-01-29 NOTE — Progress Notes (Incomplete)
Subjective:    Patient ID: Shawn Rivas, male    DOB: 09-26-1966, 57 y.o.   MRN: [id number]  Shawn Rivas is a 57 y.o. male presenting on 01/29/2023 for Diabetes (Follow up and Dm order Aroflow. )   HPI  Diabetes, Type 2 Previously on strict keto / carnivore diet and it had controlled CBG / Blood pressure. Now off diet A1c down to 6.1 previously on lifestyle CGM prior results 150-160s in controlled range. Now recently off diet has been 200-300+ Off Metformin Farxiga Ozempic Lantus Daily using Humalog 20 in AM with breakfast maybe PM dose if need  Diabetic Retinopathy *** [employer] [name]  Needs [employer], CGM referral  CHRONIC HTN: Reports *** Current Meds - ***   Reports good compliance, ***took meds today. Tolerating well, w/o complaints. Lifestyle: - Diet: *** - Exercise: *** Denies CP, dyspnea, HA, edema, dizziness / lightheadedness  BPH Urgency ***  S/p accident ***  AUA BPH Symptom Score over past 1 month 1. Sensation of not emptying bladder post void - 1 2. Urinate less than 2 hour after finish last void - 5 3. Start/Stop several times during void - 2 4. Difficult to postpone urination - 5 5. Weak urinary stream - 1 6. Push or strain urination - 2 7. Nocturia - 2 times  Total Score: 16 (Moderate BPH symptoms)  ***0 (not at all), 1 (< 1 time in 5 voids), 2 (< half), 3 (half), 4 (>half), 5 (almost always)  *** 0-7 Mild 8-19 Moderate 20-35 Severe    ***Equipment DME Needs Nitro DLX Foldable Rollator lightweight seated titanium walker "[employer]" brand  Post-traumatic injuries Constant pain in thighs  Oxycodone  AS NEEDED *** only if pain. Takes about 1 x per week  ***ORDER ALL MEDS AFTER MONDAY Tamsulosin 04.mg 30 pill daily Oxycodone IR  AS NEEDED q 4 hr 30 pills 0 refill Humalog insulin 20 units with meal twice a day 15mL Valsartan  daily 90 0  Buspar  THREE TIMES A DAY AS NEEDED anxiety 90 0 Lovastatin   daily 90 0  Gabapentin 100-300mg  THREE TIMES A DAY AS NEEDED 90 pills 0 refills  Has tried Flexeril, Gabapentin   Health Maintenance: ***     01/29/2023    2:21 PM 02/20/2022   10:05 AM  Depression screen PHQ 2/9  Decreased Interest 3 1  Down, Depressed, Hopeless 2 1  PHQ - 2 Score 5 2  Altered sleeping 3 3  Tired, decreased energy 2 1  Change in appetite 1 1  Feeling bad or failure about yourself  0 2  Trouble concentrating 3 3  Moving slowly or fidgety/restless 0 2  Suicidal thoughts 0 0  PHQ-9 Score 14 14  Difficult doing work/chores Very difficult Extremely dIfficult    Social History   Tobacco Use  . Smoking status: Former    Packs/day: 3.00    Years: 20.00    Additional pack years: 0.00    Total pack years: 60.00    Types: Cigarettes    Quit date: 10/12/2010    Years since quitting: 12.3  . Smokeless tobacco: Never  Vaping Use  . Vaping Use: Never used  Substance Use Topics  . Alcohol use: Yes  . Drug use: No    Review of Systems Per HPI unless specifically indicated above     Objective:    BP (!) 160/110 (BP Location: Right Arm, Patient Position: Sitting, Cuff Size: Normal)   Pulse 90  Resp 17   Ht  (1.778 m)   Wt 229 lb 6.4 oz (104.1 kg)   BMI 32.92 kg/m   Wt Readings from Last 3 Encounters:  01/29/23 229 lb 6.4 oz (104.1 kg)  07/21/22 219 lb 5.7 oz (99.5 kg)  05/26/22 219 lb 6.4 oz (99.5 kg)    Physical Exam    Results for orders placed or performed in visit on 08/26/22  HM DIABETES EYE EXAM  Result Value Ref Range   HM Diabetic Eye Exam Retinopathy (A) No Retinopathy      Assessment & Plan:   Problem List Items Addressed This Visit   None   No orders of the defined types were placed in this encounter.    Follow up plan: No follow-ups on file.  Future labs ordered for ***  Shawn Pilar, DO [facility] 01/29/2023, 2:30 PM

## 2023-02-01 ENCOUNTER — Encounter: Payer: Self-pay | Admitting: Family Medicine

## 2023-02-01 ENCOUNTER — Other Ambulatory Visit: Payer: Medicare Other

## 2023-02-01 DIAGNOSIS — R7989 Other specified abnormal findings of blood chemistry: Secondary | ICD-10-CM

## 2023-02-01 DIAGNOSIS — Z794 Long term (current) use of insulin: Secondary | ICD-10-CM

## 2023-02-01 DIAGNOSIS — E1169 Type 2 diabetes mellitus with other specified complication: Secondary | ICD-10-CM

## 2023-02-01 DIAGNOSIS — Z125 Encounter for screening for malignant neoplasm of prostate: Secondary | ICD-10-CM

## 2023-02-02 ENCOUNTER — Other Ambulatory Visit: Payer: Medicare Other

## 2023-02-19 DIAGNOSIS — E1165 Type 2 diabetes mellitus with hyperglycemia: Secondary | ICD-10-CM | POA: Diagnosis not present

## 2023-03-09 ENCOUNTER — Other Ambulatory Visit: Payer: Medicare Other

## 2023-03-09 DIAGNOSIS — E785 Hyperlipidemia, unspecified: Secondary | ICD-10-CM | POA: Diagnosis not present

## 2023-03-09 DIAGNOSIS — R7989 Other specified abnormal findings of blood chemistry: Secondary | ICD-10-CM | POA: Diagnosis not present

## 2023-03-09 DIAGNOSIS — E1169 Type 2 diabetes mellitus with other specified complication: Secondary | ICD-10-CM | POA: Diagnosis not present

## 2023-03-09 DIAGNOSIS — Z794 Long term (current) use of insulin: Secondary | ICD-10-CM | POA: Diagnosis not present

## 2023-03-10 ENCOUNTER — Telehealth: Payer: Self-pay | Admitting: *Deleted

## 2023-03-10 DIAGNOSIS — Z794 Long term (current) use of insulin: Secondary | ICD-10-CM

## 2023-03-10 DIAGNOSIS — G8921 Chronic pain due to trauma: Secondary | ICD-10-CM

## 2023-03-10 DIAGNOSIS — F419 Anxiety disorder, unspecified: Secondary | ICD-10-CM

## 2023-03-10 DIAGNOSIS — E1169 Type 2 diabetes mellitus with other specified complication: Secondary | ICD-10-CM

## 2023-03-10 DIAGNOSIS — I10 Essential (primary) hypertension: Secondary | ICD-10-CM

## 2023-03-10 DIAGNOSIS — N401 Enlarged prostate with lower urinary tract symptoms: Secondary | ICD-10-CM

## 2023-03-10 DIAGNOSIS — E1142 Type 2 diabetes mellitus with diabetic polyneuropathy: Secondary | ICD-10-CM

## 2023-03-10 LAB — CBC WITH DIFFERENTIAL/PLATELET
Absolute Monocytes: 493 cells/uL (ref 200–950)
Basophils Absolute: 100 cells/uL (ref 0–200)
Basophils Relative: 1.3 %
Eosinophils Absolute: 177 cells/uL (ref 15–500)
Eosinophils Relative: 2.3 %
HCT: 50.6 % — ABNORMAL HIGH (ref 38.5–50.0)
Hemoglobin: 16.9 g/dL (ref 13.2–17.1)
Lymphs Abs: 2687 cells/uL (ref 850–3900)
MCH: 30.4 pg (ref 27.0–33.0)
MCHC: 33.4 g/dL (ref 32.0–36.0)
MCV: 91 fL (ref 80.0–100.0)
MPV: 10.8 fL (ref 7.5–12.5)
Monocytes Relative: 6.4 %
Neutro Abs: 4243 cells/uL (ref 1500–7800)
Neutrophils Relative %: 55.1 %
Platelets: 427 10*3/uL — ABNORMAL HIGH (ref 140–400)
RBC: 5.56 10*6/uL (ref 4.20–5.80)
RDW: 11.8 % (ref 11.0–15.0)
Total Lymphocyte: 34.9 %
WBC: 7.7 10*3/uL (ref 3.8–10.8)

## 2023-03-10 LAB — COMPLETE METABOLIC PANEL WITH GFR
AG Ratio: 1.1 (calc) (ref 1.0–2.5)
ALT: 18 U/L (ref 9–46)
AST: 14 U/L (ref 10–35)
Albumin: 4 g/dL (ref 3.6–5.1)
Alkaline phosphatase (APISO): 112 U/L (ref 35–144)
BUN: 22 mg/dL (ref 7–25)
CO2: 23 mmol/L (ref 20–32)
Calcium: 10.1 mg/dL (ref 8.6–10.3)
Chloride: 99 mmol/L (ref 98–110)
Creat: 1.15 mg/dL (ref 0.70–1.30)
Globulin: 3.7 g/dL (calc) (ref 1.9–3.7)
Glucose, Bld: 404 mg/dL — ABNORMAL HIGH (ref 65–99)
Potassium: 4.6 mmol/L (ref 3.5–5.3)
Sodium: 135 mmol/L (ref 135–146)
Total Bilirubin: 0.5 mg/dL (ref 0.2–1.2)
Total Protein: 7.7 g/dL (ref 6.1–8.1)
eGFR: 75 mL/min/{1.73_m2} (ref >=60)

## 2023-03-10 LAB — LIPID PANEL
Cholesterol: 303 mg/dL — ABNORMAL HIGH (ref <200)
HDL: 52 mg/dL (ref >=40)
LDL Cholesterol (Calc): 198 mg/dL (calc) — ABNORMAL HIGH
Non-HDL Cholesterol (Calc): 251 mg/dL (calc) — ABNORMAL HIGH (ref <130)
Total CHOL/HDL Ratio: 5.8 (calc) — ABNORMAL HIGH (ref <5.0)
Triglycerides: 314 mg/dL — ABNORMAL HIGH (ref <150)

## 2023-03-10 LAB — PSA: PSA: 0.08 ng/mL (ref <=4.00)

## 2023-03-10 LAB — HEMOGLOBIN A1C: Hgb A1c MFr Bld: >14 % of total Hgb — ABNORMAL HIGH (ref <5.7)

## 2023-03-10 LAB — TESTOSTERONE: Testosterone: 454 ng/dL (ref 250–827)

## 2023-03-10 LAB — TSH: TSH: 2.46 mIU/L (ref 0.40–4.50)

## 2023-03-10 MED ORDER — VALSARTAN 80 MG PO TABS: 80.0000 mg | ORAL_TABLET | Freq: Every day | ORAL | 1 refills | Status: DC

## 2023-03-10 MED ORDER — OXYCODONE HCL 5 MG PO TABS: 5.0000 mg | ORAL_TABLET | ORAL | 0 refills | Status: DC | PRN

## 2023-03-10 MED ORDER — TAMSULOSIN HCL 0.4 MG PO CAPS: 0.4000 mg | ORAL_CAPSULE | Freq: Every day | ORAL | 1 refills | Status: DC

## 2023-03-10 MED ORDER — LOVASTATIN 40 MG PO TABS: 40.0000 mg | ORAL_TABLET | Freq: Every day | ORAL | 1 refills | Status: DC

## 2023-03-10 MED ORDER — BUSPIRONE HCL 5 MG PO TABS: 5.0000 mg | ORAL_TABLET | Freq: Three times a day (TID) | ORAL | 2 refills | Status: DC | PRN

## 2023-03-10 MED ORDER — INSULIN LISPRO (1 UNIT DIAL) 100 UNIT/ML (KWIKPEN): PEN_INJECTOR | SUBCUTANEOUS | 3 refills | Status: DC

## 2023-03-10 MED ORDER — GABAPENTIN 100 MG PO CAPS: 100.0000 mg | ORAL_CAPSULE | Freq: Three times a day (TID) | ORAL | 2 refills | Status: DC

## 2023-03-10 NOTE — Addendum Note (Signed)
Addended by: Smitty Cords on: 03/10/2023 01:16 PM   Modules accepted: Orders

## 2023-03-10 NOTE — Telephone Encounter (Signed)
Acknowledged critical lab. He had been in 200-400 range on his readings prior to this lab test. He was off most of medications.  Can you contact him to confirm which pharmacy he needs meds sent to?  It looks like he is 6 weeks late on getting the labs done. We saw him on 01/29/23. And he was to do blood that week and we would order meds after labs.  This is the med list   Tamsulosin 04.mg 30 pill daily Oxycodone IR 5mg  AS NEEDED q 4 hr 30 pills 0 refill Humalog insulin 20 units with meal twice a day 15mL Valsartan 80mg  daily 90 0  Buspar 5mg  THREE TIMES A DAY AS NEEDED anxiety 90 0 Lovastatin 40mg  daily 90 0  Gabapentin 100-300mg  THREE TIMES A DAY AS NEEDED 90 pills 0 refills  Saralyn Pilar, DO [facility] 03/10/2023, 11:06 AM

## 2023-03-10 NOTE — Telephone Encounter (Signed)
Spoke with patient and he uses [facility].  Pharmacy updated.  He has not been taking any medication.

## 2023-03-10 NOTE — Telephone Encounter (Signed)
Critical lab results: Glucose 404  Verified lab in chart and provider will be notified

## 2023-03-19 ENCOUNTER — Encounter: Payer: Self-pay | Admitting: Family Medicine

## 2023-03-22 DIAGNOSIS — E1165 Type 2 diabetes mellitus with hyperglycemia: Secondary | ICD-10-CM | POA: Diagnosis not present

## 2023-04-21 DIAGNOSIS — M25561 Pain in right knee: Secondary | ICD-10-CM | POA: Diagnosis not present

## 2023-04-21 DIAGNOSIS — G629 Polyneuropathy, unspecified: Secondary | ICD-10-CM | POA: Diagnosis not present

## 2023-04-21 DIAGNOSIS — E1165 Type 2 diabetes mellitus with hyperglycemia: Secondary | ICD-10-CM | POA: Diagnosis not present

## 2023-04-21 DIAGNOSIS — M25562 Pain in left knee: Secondary | ICD-10-CM | POA: Diagnosis not present

## 2023-04-21 DIAGNOSIS — Z96642 Presence of left artificial hip joint: Secondary | ICD-10-CM | POA: Diagnosis not present

## 2023-04-21 DIAGNOSIS — M25512 Pain in left shoulder: Secondary | ICD-10-CM | POA: Diagnosis not present

## 2023-04-21 DIAGNOSIS — R202 Paresthesia of skin: Secondary | ICD-10-CM | POA: Diagnosis not present

## 2023-04-21 DIAGNOSIS — M25552 Pain in left hip: Secondary | ICD-10-CM | POA: Diagnosis not present

## 2023-04-21 DIAGNOSIS — W1830XA Fall on same level, unspecified, initial encounter: Secondary | ICD-10-CM | POA: Diagnosis not present

## 2023-05-02 DIAGNOSIS — E1165 Type 2 diabetes mellitus with hyperglycemia: Secondary | ICD-10-CM | POA: Diagnosis not present

## 2023-05-22 DIAGNOSIS — E1165 Type 2 diabetes mellitus with hyperglycemia: Secondary | ICD-10-CM | POA: Diagnosis not present

## 2023-05-24 ENCOUNTER — Encounter: Payer: Self-pay | Admitting: Family Medicine

## 2023-05-24 DIAGNOSIS — G8921 Chronic pain due to trauma: Secondary | ICD-10-CM

## 2023-05-24 MED ORDER — OXYCODONE HCL 5 MG PO TABS: 5.0000 mg | ORAL_TABLET | ORAL | 0 refills | Status: DC | PRN

## 2023-06-01 ENCOUNTER — Telehealth (INDEPENDENT_AMBULATORY_CARE_PROVIDER_SITE_OTHER): Payer: Medicare Other | Admitting: Family Medicine

## 2023-06-01 ENCOUNTER — Encounter: Payer: Self-pay | Admitting: Family Medicine

## 2023-06-01 VITALS — Ht 70.0 in | Wt 229.0 lb

## 2023-06-01 DIAGNOSIS — R42 Dizziness and giddiness: Secondary | ICD-10-CM

## 2023-06-01 DIAGNOSIS — Z794 Long term (current) use of insulin: Secondary | ICD-10-CM | POA: Diagnosis not present

## 2023-06-01 DIAGNOSIS — E1169 Type 2 diabetes mellitus with other specified complication: Secondary | ICD-10-CM

## 2023-06-01 DIAGNOSIS — G8921 Chronic pain due to trauma: Secondary | ICD-10-CM | POA: Diagnosis not present

## 2023-06-01 DIAGNOSIS — R5383 Other fatigue: Secondary | ICD-10-CM

## 2023-06-01 DIAGNOSIS — Z6832 Body mass index (BMI) 32.0-32.9, adult: Secondary | ICD-10-CM

## 2023-06-01 DIAGNOSIS — M153 Secondary multiple arthritis: Secondary | ICD-10-CM | POA: Diagnosis not present

## 2023-06-01 DIAGNOSIS — Z7984 Long term (current) use of oral hypoglycemic drugs: Secondary | ICD-10-CM

## 2023-06-01 DIAGNOSIS — G4710 Hypersomnia, unspecified: Secondary | ICD-10-CM

## 2023-06-01 DIAGNOSIS — R413 Other amnesia: Secondary | ICD-10-CM | POA: Diagnosis not present

## 2023-06-01 MED ORDER — LANTUS SOLOSTAR 100 UNIT/ML ~~LOC~~ SOPN: 40.0000 [IU] | PEN_INJECTOR | Freq: Every day | SUBCUTANEOUS | 2 refills | Status: DC

## 2023-06-01 MED ORDER — METFORMIN HCL ER 500 MG PO TB24: 1000.0000 mg | ORAL_TABLET | Freq: Every day | ORAL | 1 refills | Status: DC

## 2023-06-01 NOTE — Patient Instructions (Addendum)
For Diabetes Restart use of Dexcom CGM  Re order Lantus 40 units nightly Stop Humalog during the day since meals are erratic and not consistent. Caution hypoglycemia if not eating.  Restart Metformin. Will switch to XR 500mg  x 2 = 1000mg  daily in AM.  Referral to [facility] to resume Diabetes management since last result >14% uncontrolled A1c.  For blood pressure Recommend restarting checking BP Resume Valsartan 80mg  daily, already has med.  Regarding Pain  medication Keep on Oxycodone AS NEEDED if only taking 2-3 times per day that is okay. We can manage it. If you need it more frequently - we would be referring you to Pain Management.  Unable to locate records from [facility]. We cannot view the imaging or results. Based on circumstances and current symptoms with dizziness fatigue, hypersomnia, memory loss and other neurological concerns, I will place referral to [facility] for evaluation.  I do believe that this will improve once your blood sugar is adequately treated and controlled. It can contribute to Neurological symptoms as well.  If symptoms suddenly worse or new concerns as we discussed, please seek care at local ED for evaluation.   Please schedule a Follow-up Appointment to: No follow-ups on file.  If you have any other questions or concerns, please feel free to call the office or send a message through MyChart. You may also schedule an earlier appointment if necessary.  Additionally, you may be receiving a survey about your experience at our office within a few days to 1 week by e-mail or mail. We value your feedback.  Saralyn Pilar, DO [facility], [place]
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
## 2023-06-28 ENCOUNTER — Telehealth: Payer: Self-pay | Admitting: Family Medicine

## 2023-06-28 NOTE — Telephone Encounter (Signed)
Nikki,  When you return and are available, I could use your help with this patient's referrals.  Both locations Neurology + Endocrinology have attempted to contact the patient and schedule. Neither of them were successful.  To prevent future issues with referrals and failed current referrals - could you assist and try to follow-up with patient as well and see if we can coordinate with him to get these apts scheduled?  Thank you  Saralyn Pilar, DO [facility] 06/28/2023, 5:26 PM

## 2023-08-25 ENCOUNTER — Encounter: Payer: Self-pay | Admitting: Family Medicine

## 2023-09-02 ENCOUNTER — Ambulatory Visit
Admission: RE | Admit: 2023-09-02 | Discharge: 2023-09-02 | Disposition: A | Discharge status: Home / Self Care | Payer: Medicare Other | Source: Ambulatory Visit | Attending: Family Medicine | Admitting: Family Medicine

## 2023-09-02 ENCOUNTER — Telehealth: Payer: Self-pay | Admitting: *Deleted

## 2023-09-02 ENCOUNTER — Ambulatory Visit: Payer: Self-pay | Admitting: Licensed Clinical Social Worker

## 2023-09-02 ENCOUNTER — Encounter: Payer: Self-pay | Admitting: Family Medicine

## 2023-09-02 ENCOUNTER — Ambulatory Visit
Admission: RE | Admit: 2023-09-02 | Discharge: 2023-09-02 | Disposition: A | Discharge status: Home / Self Care | Payer: Medicare Other | Attending: Family Medicine | Admitting: Family Medicine

## 2023-09-02 ENCOUNTER — Other Ambulatory Visit: Payer: Medicare Other

## 2023-09-02 ENCOUNTER — Other Ambulatory Visit: Payer: Self-pay

## 2023-09-02 ENCOUNTER — Ambulatory Visit: Payer: Medicare Other | Admitting: Family Medicine

## 2023-09-02 VITALS — BP 148/88 | Ht 70.0 in | Wt 229.2 lb

## 2023-09-02 DIAGNOSIS — E1169 Type 2 diabetes mellitus with other specified complication: Secondary | ICD-10-CM

## 2023-09-02 DIAGNOSIS — M153 Secondary multiple arthritis: Secondary | ICD-10-CM

## 2023-09-02 DIAGNOSIS — F331 Major depressive disorder, recurrent, moderate: Secondary | ICD-10-CM

## 2023-09-02 DIAGNOSIS — M533 Sacrococcygeal disorders, not elsewhere classified: Secondary | ICD-10-CM | POA: Diagnosis not present

## 2023-09-02 DIAGNOSIS — I10 Essential (primary) hypertension: Secondary | ICD-10-CM

## 2023-09-02 DIAGNOSIS — G8921 Chronic pain due to trauma: Secondary | ICD-10-CM | POA: Diagnosis not present

## 2023-09-02 DIAGNOSIS — Z794 Long term (current) use of insulin: Secondary | ICD-10-CM | POA: Diagnosis not present

## 2023-09-02 MED ORDER — ACCU-CHEK GUIDE TEST VI STRP: ORAL_STRIP | 12 refills | Status: DC

## 2023-09-02 MED ORDER — ACCU-CHEK GUIDE W/DEVICE KIT: PACK | 0 refills | Status: DC

## 2023-09-02 MED ORDER — ACCU-CHEK SOFTCLIX LANCETS MISC: 3 refills | Status: DC

## 2023-09-02 MED ORDER — OXYCODONE HCL 5 MG PO TABS: 5.0000 mg | ORAL_TABLET | ORAL | 0 refills | Status: DC | PRN

## 2023-09-02 NOTE — Patient Instructions (Addendum)
Thank you for coming to the office today.  Alto Denver, nurse case manager will call today  X-ray for Coccyx sacrum tailbone - stay tuned.  Oxycodone pain medicine re ordered to [facility]  Tresiba insulin 30-40 units daily for now, pen lasts 8 weeks out of fridge, we can get more if needed. Until the pharmacist can help with rx.   Please schedule a Follow-up Appointment to: Return if symptoms worsen or fail to improve.  If you have any other questions or concerns, please feel free to call the office or send a message through MyChart. You may also schedule an earlier appointment if necessary.  Additionally, you may be receiving a survey about your experience at our office within a few days to 1 week by e-mail or mail. We value your feedback.  Saralyn Pilar, DO [facility], [place]
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
## 2023-09-02 NOTE — Progress Notes (Signed)
  Care Coordination   Note   09/02/2023 Name: Shawn Rivas MRN: [id number] DOB: October 15, 1965  Shawn Rivas is a 57 y.o. year old male who sees Smitty Cords, DO for primary care. I reached out to [name] by phone today to offer care coordination services.  [name] was given information about Care Coordination services today including:   The Care Coordination services include support from the care team which includes your Nurse Coordinator, Clinical Social Worker, or Pharmacist.  The Care Coordination team is here to help remove barriers to the health concerns and goals most important to you. Care Coordination services are voluntary, and the patient may decline or stop services at any time by request to their care team member.   Care Coordination Consent Status: Patient agreed to services and verbal consent obtained.   Follow up plan:  Telephone appointment with care coordination team member scheduled for:  09/02/2023  Encounter Outcome:  Patient Scheduled from referral   Burman Nieves, [facility] Care Coordination Care Guide Direct Dial: [phone]

## 2023-09-02 NOTE — Patient Outreach (Signed)
LCSW A. Felton Clinton called Shawn Rivas for provide resources for food and utilities assistance. Pt reports his is aware to contact his local DSS office for utility assistance. Pt reports his greatest need is locating a payee to handle his finances due to his poor decision making. Shawn Rivas reports is monthly income is 1800 from social security and his mortgage is 450.00 a month. Shawn Rivas reports he still drives and is aware of community resources. LCSW. A Felton Clinton will mail food pantry listing to Shawn Rivas

## 2023-09-02 NOTE — Patient Outreach (Signed)
Care Management   Visit Note  09/02/2023 Name: Shawn Rivas MRN: [id number] DOB: [birth year]  Subjective: Shawn Rivas is a 57 y.o. year old male who is a primary care patient of Smitty Cords, DO. The Care Management team was consulted for assistance.      Engaged with patient spoke with patient by telephone.    Goals Addressed             This Visit's Progress    RNCM: Care Management: Chronic Disease Management and Care Coordination Needs: HTN, DM, and  Depression       Current Barriers:  Knowledge Deficits related to plan of care for management of HTN, DMII, Anxiety, and Depression  Care Coordination needs related to Financial constraints related to behind on mortgage and possible losing his home, inability to get needed help for legal services, and current concerns with daughter who just moved out, Limited social support, Limited access to food, Housing barriers, Level of care concerns, ADL IADL limitations, Mental Health Concerns , Family and relationship dysfunction, Lack of essential utilities - getting ready to have phone services, utilities cut off and also his refrigerator quit working., Limited access to caregiver, Inability to perform ADL's independently, Inability to perform IADL's independently, and Lacks knowledge of community resource: food and DSS assistance.  Chronic Disease Management support and education needs related to HTN, DMII, Anxiety, and Depression  Lacks caregiver support Financial Constraints  Non-adherence to scheduled provider appointments Non-adherence to prescribed medication regimen  RNCM Clinical Goal(s):  Patient will verbalize basic understanding of  HTN, DMII, Anxiety, and Depression disease process and self health management plan as evidenced by compliance with the plan of care, stabilization of chronic diseases, working with the care management team to achieve health and wellness goals take all medications exactly as prescribed  and will call provider for medication related questions as evidenced by compliance with the medications regimen attend all scheduled medical appointments: with pcp and specialist as evidenced by compliance with appointments  demonstrate Improved adherence to prescribed treatment plan for HTN, DMII, Anxiety, and Depression as evidenced by taking medications, following the plan of care, calling the office for changes in condition and working with the Care management team to optimize health and well being continue to work with RN Care Manager to address care management and care coordination needs related to  HTN, DMII, Anxiety, and Depression as evidenced by adherence to CM Team Scheduled appointments work with pharmacist to address Financial constraints related to being able to pay mortgage, being able to have good food supply, and inability to care for his home, Limited social support, Limited access to food, Housing barriers, Level of care concerns, ADL IADL limitations, Mental Health Concerns , Social Isolation, Lack of essential utilities - gas bill scheduled to be cut off on 09-06-2023 and other bills are behind*, Inability to perform IADL's independently, and Lacks knowledge of community resource: food resources, Retail banker and other community support programs related toHTN, DMII, Anxiety, and Depression as evidenced by review or EMR and patient or pharmacist report work with Child psychotherapist to address  related to the management of Limited social support, Limited access to food, Housing barriers, Level of care concerns, ADL IADL limitations, Mental Health Concerns , Family and relationship dysfunction, Social Isolation, and Limited access to caregiver related to the management of HTN, DMII, Anxiety, and Depression as evidenced by review of EMR and patient or social worker report work with [employer] care guide to address needs  related to  Financial constraints related to inability to pay for housing  expenses and bills, Limited access to food, Housing barriers, and Lack of essential utilities - gas bill over due, mortgage over due as evidenced by patient and/or community resource care guide support through collaboration with Medical illustrator, provider, and care team.   Interventions: Evaluation of current treatment plan related to  self management and patient's adherence to plan as established by provider   Diabetes Interventions:  (Status:  New goal.) Long Term Goal   Assessed patient's understanding of A1c goal: <7% Provided education to patient about basic DM disease process Reviewed medications with patient and discussed importance of medication adherence Counseled on importance of regular laboratory monitoring as prescribed Discussed plans with patient for ongoing care management follow up and provided patient with direct contact information for care management team Provided patient with written educational materials related to hypo and hyperglycemia and importance of correct treatment Reviewed scheduled/upcoming provider appointments including: saw pcp today, review of follow up with the pcp as scheduled  Advised patient, providing education and rationale, to check cbg as directed by the pcp and record, calling pcp for findings outside established parameters Referral made to pharmacy team for assistance with help with DM and medication compliance, urgent referral placed Referral made to social work team for assistance with depression, anxiety, and legal aide needs  Referral made to community resources care guide team for assistance with food resources and community support  Review of patient status, including review of consultants reports, relevant laboratory and other test results, and medications completed Screening for signs and symptoms of depression related to chronic disease state  Assessed social determinant of health barriers Lab Results  Component Value Date   HGBA1C >14.0  (H) 03/09/2023    Depression and Anxiety  (Status:  New goal.)  Long Term Goal Evaluation of current treatment plan related to Anxiety and Depression, Limited social support, Housing barriers, Level of care concerns, ADL IADL limitations, Mental Health Concerns , Family and relationship dysfunction, and Inability to perform IADL's independently self-management and patient's adherence to plan as established by provider. Discussed plans with patient for ongoing care management follow up and provided patient with direct contact information for care management team Evaluation of current treatment plan related to anxiety and depression and patient's adherence to plan as established by provider Advised patient to call the office for changes in mood, anxiety, or depression  Provided education to patient re: referrals placed for team support and education Reviewed medications with patient and discussed compliance Collaborated with pcp,pharm D, and SW regarding the patients needs and how to best support the help the patient needs. Reviewed scheduled/upcoming provider appointments including saw pcp today, had xrays and Social Work referral for depression and anxiety, legal assistance needs Discussed plans with patient for ongoing care management follow up and provided patient with direct contact information for care management team Advised patient to discuss changes in his depression, anxiety and mental health needs with provider Screening for signs and symptoms of depression related to chronic disease state  Assessed social determinant of health barriers  Hypertension Interventions:  (Status:  New goal.) Long Term Goal Last practice recorded BP readings:  BP Readings from Last 3 Encounters:  09/02/23 (!) 148/90  01/29/23 (!) 154/108  09/14/22 (!) 140/78   Most recent eGFR/CrCl:  Lab Results  Component Value Date   EGFR 75 03/09/2023    No components found for: "CRCL"  Evaluation of current  treatment  plan related to hypertension self management and patient's adherence to plan as established by provider Provided education to patient re: stroke prevention, s/s of heart attack and stroke Reviewed medications with patient and discussed importance of compliance Discussed plans with patient for ongoing care management follow up and provided patient with direct contact information for care management team Advised patient, providing education and rationale, to monitor blood pressure daily and record, calling PCP for findings outside established parameters Advised patient to discuss changes in HTN and Heart health  with provider Provided education on prescribed diet Heart Healthy/ADA diet  Discussed complications of poorly controlled blood pressure such as heart disease, stroke, circulatory complications, vision complications, kidney impairment, sexual dysfunction Screening for signs and symptoms of depression related to chronic disease state  Assessed social determinant of health barriers  Patient Goals/Self-Care Activities: Take all medications as prescribed Attend all scheduled provider appointments Call pharmacy for medication refills 3-7 days in advance of running out of medications Call provider office for new concerns or questions  Work with the social worker to address care coordination needs and will continue to work with the clinical team to address health care and disease management related needs call the Suicide and Crisis Lifeline: 988 call the [place] National Suicide Prevention Lifeline: (410)142-8516 or TTY: 708-709-8866 TTY (816)482-3876) to talk to a trained counselor call 1-800-273-TALK (toll free, 24 hour hotline) if experiencing a Mental Health or Behavioral Health Crisis  keep appointment with eye doctor check feet daily for cuts, sores or redness trim toenails straight across limit fast food meals to no more than 1 per week manage portion size prepare main meal at  home 3 to 5 days each week wash and dry feet carefully every day wear comfortable, cotton socks wear comfortable, well-fitting shoes check blood pressure weekly learn about high blood pressure keep a blood pressure log keep all doctor appointments take medications for blood pressure exactly as prescribed report new symptoms to your doctor eat more whole grains, fruits and vegetables, lean meats and healthy fats  Follow Up Plan:  Telephone follow up appointment with care management team member scheduled for:  09-30-2023 at 1 pm            Consent to Services:  Patient was given information about care management services, agreed to services, and gave verbal consent to participate.   Plan: Telephone follow up appointment with care management team member scheduled for: 09-30-2023 at 1 pm  Alto Denver RN, MSN, CCM RN Care Manager  [facility]  Ambulatory Care Management  Direct Number: [phone]

## 2023-09-02 NOTE — Patient Outreach (Signed)
  Care Coordination   Initial Visit Note   09/02/2023 Name: Shawn Rivas MRN: [id number] DOB: 05/07/1966  Shawn Rivas is a 57 y.o. year old male who sees Smitty Cords, DO for primary care. I spoke with  [name] by phone today.  What matters to the patients health and wellness today?  Emergent referral for food and utility assistance     Goals Addressed             This Visit's Progress    Care Coordination       Activities and task to complete in order to accomplish goals.   Call or go to Department of Social Services for payee assistance  Follow up with housing resources discussed (dss for utility assistance) Follow up on food resources discussed (listing of food pantry mailed to home address. )         SDOH assessments and interventions completed:  Yes     Care Coordination Interventions:  Yes, provided   Interventions Today    Flowsheet Row Most Recent Value  Chronic Disease   Chronic disease during today's visit Other  [generalized weakness]  Education Interventions   Education Provided Provided Education  Provided Verbal Education On [employer]  [Pt requested assistance for payee services. Advised pt DSS offers this for individuals unable to handle funds. Pt reports he went to dss but could not get out his car.]  Nutrition Interventions   Nutrition Discussed/Reviewed Nutrition Discussed  [Pt advise fridge is not working. Attempted to advise pt of food resources however pt hung up.]  Safety Interventions   Safety Discussed/Reviewed Fall Risk  [Pt reports he has fallen 4x's since july due to his home having clutter.]        Follow up plan: Follow up call scheduled for 09/16/2023    Encounter Outcome:  Patient Visit Completed    Gwyndolyn Saxon MSW, LCSW Licensed Clinical Social Worker  [facility], Population Health Direct Dial: [phone]  Fax: [phone]

## 2023-09-02 NOTE — Patient Instructions (Signed)
Visit Information  Thank you for taking time to visit with me today. Please don't hesitate to contact me if I can be of assistance to you before our next scheduled telephone appointment.  Following are the goals we discussed today:   Goals Addressed             This Visit's Progress    RNCM: Care Management: Chronic Disease Management and Care Coordination Needs: HTN, DM, and  Depression       Current Barriers:  Knowledge Deficits related to plan of care for management of HTN, DMII, Anxiety, and Depression  Care Coordination needs related to Financial constraints related to behind on mortgage and possible losing his home, inability to get needed help for legal services, and current concerns with daughter who just moved out, Limited social support, Limited access to food, Housing barriers, Level of care concerns, ADL IADL limitations, Mental Health Concerns , Family and relationship dysfunction, Lack of essential utilities - getting ready to have phone services, utilities cut off and also his refrigerator quit working., Limited access to caregiver, Inability to perform ADL's independently, Inability to perform IADL's independently, and Lacks knowledge of community resource: food and DSS assistance.  Chronic Disease Management support and education needs related to HTN, DMII, Anxiety, and Depression  Lacks caregiver support Financial Constraints  Non-adherence to scheduled provider appointments Non-adherence to prescribed medication regimen  RNCM Clinical Goal(s):  Patient will verbalize basic understanding of  HTN, DMII, Anxiety, and Depression disease process and self health management plan as evidenced by compliance with the plan of care, stabilization of chronic diseases, working with the care management team to achieve health and wellness goals take all medications exactly as prescribed and will call provider for medication related questions as evidenced by compliance with the medications  regimen attend all scheduled medical appointments: with pcp and specialist as evidenced by compliance with appointments  demonstrate Improved adherence to prescribed treatment plan for HTN, DMII, Anxiety, and Depression as evidenced by taking medications, following the plan of care, calling the office for changes in condition and working with the Care management team to optimize health and well being continue to work with RN Care Manager to address care management and care coordination needs related to  HTN, DMII, Anxiety, and Depression as evidenced by adherence to CM Team Scheduled appointments work with pharmacist to address Financial constraints related to being able to pay mortgage, being able to have good food supply, and inability to care for his home, Limited social support, Limited access to food, Housing barriers, Level of care concerns, ADL IADL limitations, Mental Health Concerns , Social Isolation, Lack of essential utilities - gas bill scheduled to be cut off on 09-06-2023 and other bills are behind*, Inability to perform IADL's independently, and Lacks knowledge of community resource: food resources, Retail banker and other community support programs related toHTN, DMII, Anxiety, and Depression as evidenced by review or EMR and patient or pharmacist report work with Child psychotherapist to address  related to the management of Limited social support, Limited access to food, Housing barriers, Level of care concerns, ADL IADL limitations, Mental Health Concerns , Family and relationship dysfunction, Social Isolation, and Limited access to caregiver related to the management of HTN, DMII, Anxiety, and Depression as evidenced by review of EMR and patient or social worker report work with community resource care guide to address needs related to  Financial constraints related to inability to pay for housing expenses and bills, Limited access to food, Housing  barriers, and Lack of essential utilities - gas bill  over due, mortgage over due as evidenced by patient and/or community resource care guide support through collaboration with Medical illustrator, provider, and care team.   Interventions: Evaluation of current treatment plan related to  self management and patient's adherence to plan as established by provider   Diabetes Interventions:  (Status:  New goal.) Long Term Goal   Assessed patient's understanding of A1c goal: <7% Provided education to patient about basic DM disease process Reviewed medications with patient and discussed importance of medication adherence Counseled on importance of regular laboratory monitoring as prescribed Discussed plans with patient for ongoing care management follow up and provided patient with direct contact information for care management team Provided patient with written educational materials related to hypo and hyperglycemia and importance of correct treatment Reviewed scheduled/upcoming provider appointments including: saw pcp today, review of follow up with the pcp as scheduled  Advised patient, providing education and rationale, to check cbg as directed by the pcp and record, calling pcp for findings outside established parameters Referral made to pharmacy team for assistance with help with DM and medication compliance, urgent referral placed Referral made to social work team for assistance with depression, anxiety, and legal aide needs  Referral made to community resources care guide team for assistance with food resources and community support  Review of patient status, including review of consultants reports, relevant laboratory and other test results, and medications completed Screening for signs and symptoms of depression related to chronic disease state  Assessed social determinant of health barriers Lab Results  Component Value Date   HGBA1C >14.0 (H) 03/09/2023    Depression and Anxiety  (Status:  New goal.)  Long Term Goal Evaluation of current  treatment plan related to Anxiety and Depression, Limited social support, Housing barriers, Level of care concerns, ADL IADL limitations, Mental Health Concerns , Family and relationship dysfunction, and Inability to perform IADL's independently self-management and patient's adherence to plan as established by provider. Discussed plans with patient for ongoing care management follow up and provided patient with direct contact information for care management team Evaluation of current treatment plan related to anxiety and depression and patient's adherence to plan as established by provider Advised patient to call the office for changes in mood, anxiety, or depression  Provided education to patient re: referrals placed for team support and education Reviewed medications with patient and discussed compliance Collaborated with pcp,pharm D, and SW regarding the patients needs and how to best support the help the patient needs. Reviewed scheduled/upcoming provider appointments including saw pcp today, had xrays and Social Work referral for depression and anxiety, legal assistance needs Discussed plans with patient for ongoing care management follow up and provided patient with direct contact information for care management team Advised patient to discuss changes in his depression, anxiety and mental health needs with provider Screening for signs and symptoms of depression related to chronic disease state  Assessed social determinant of health barriers  Hypertension Interventions:  (Status:  New goal.) Long Term Goal Last practice recorded BP readings:  BP Readings from Last 3 Encounters:  09/02/23 (!) 148/90  01/29/23 (!) 154/108  09/14/22 (!) 140/78   Most recent eGFR/CrCl:  Lab Results  Component Value Date   EGFR 75 03/09/2023    No components found for: "CRCL"  Evaluation of current treatment plan related to hypertension self management and patient's adherence to plan as established by  provider Provided education to patient re:  stroke prevention, s/s of heart attack and stroke Reviewed medications with patient and discussed importance of compliance Discussed plans with patient for ongoing care management follow up and provided patient with direct contact information for care management team Advised patient, providing education and rationale, to monitor blood pressure daily and record, calling PCP for findings outside established parameters Advised patient to discuss changes in HTN and Heart health  with provider Provided education on prescribed diet Heart Healthy/ADA diet  Discussed complications of poorly controlled blood pressure such as heart disease, stroke, circulatory complications, vision complications, kidney impairment, sexual dysfunction Screening for signs and symptoms of depression related to chronic disease state  Assessed social determinant of health barriers  Patient Goals/Self-Care Activities: Take all medications as prescribed Attend all scheduled provider appointments Call pharmacy for medication refills 3-7 days in advance of running out of medications Call provider office for new concerns or questions  Work with the social worker to address care coordination needs and will continue to work with the clinical team to address health care and disease management related needs call the Suicide and Crisis Lifeline: 988 call the [place] National Suicide Prevention Lifeline: 725-804-0488 or TTY: 424-144-8823 TTY 3315261265) to talk to a trained counselor call 1-800-273-TALK (toll free, 24 hour hotline) if experiencing a Mental Health or Behavioral Health Crisis  keep appointment with eye doctor check feet daily for cuts, sores or redness trim toenails straight across limit fast food meals to no more than 1 per week manage portion size prepare main meal at home 3 to 5 days each week wash and dry feet carefully every day wear comfortable, cotton socks wear  comfortable, well-fitting shoes check blood pressure weekly learn about high blood pressure keep a blood pressure log keep all doctor appointments take medications for blood pressure exactly as prescribed report new symptoms to your doctor eat more whole grains, fruits and vegetables, lean meats and healthy fats  Follow Up Plan:  Telephone follow up appointment with care management team member scheduled for:  09-30-2023 at 1 pm           Our next appointment is by telephone on 09-30-2023 at 1 pm  Please call the care guide team at [phone] if you need to cancel or reschedule your appointment.   If you are experiencing a Mental Health or Behavioral Health Crisis or need someone to talk to, please call the Suicide and Crisis Lifeline: 988 call the [place] National Suicide Prevention Lifeline: (954)592-6404 or TTY: 859-667-0762 TTY 980-546-7802) to talk to a trained counselor call 1-800-273-TALK (toll free, 24 hour hotline)   Patient verbalizes understanding of instructions and care plan provided today and agrees to view in MyChart. Active MyChart status and patient understanding of how to access instructions and care plan via MyChart confirmed with patient.     Telephone follow up appointment with care management team member scheduled for: 09-30-2023 at 1 pm  Alto Denver RN, MSN, CCM RN Care Manager  [facility]  Ambulatory Care Management  Direct Number: [phone]

## 2023-09-06 ENCOUNTER — Telehealth: Payer: Self-pay | Admitting: Pharmacist

## 2023-09-06 NOTE — Telephone Encounter (Signed)
   Outreach Note  09/06/2023 Name: Shawn Rivas MRN: [id number] DOB: 03/13/1966  Referred by: Smitty Cords, DO Reason for referral : Medication Adherence and Medication Management  Was unable to reach patient via telephone today as reach a message that call cannot be completed as dialed.  From review of referral message note per [facility] patient was concerned that his phone me be cut off soon, but patient active with MyChart  Follow Up Plan: Will send patient a message via MyChart in attempt to reach him  Estelle Grumbles, PharmD, Patsy [place], CPP Clinical Pharmacist [facility] [phone]

## 2023-09-16 ENCOUNTER — Encounter: Payer: Self-pay | Admitting: Family Medicine

## 2023-09-16 DIAGNOSIS — M153 Secondary multiple arthritis: Secondary | ICD-10-CM

## 2023-09-16 DIAGNOSIS — R531 Weakness: Secondary | ICD-10-CM

## 2023-09-23 NOTE — Addendum Note (Signed)
Addended by: Smitty Cords on: 09/23/2023 02:49 PM   Modules accepted: Orders

## 2023-09-30 ENCOUNTER — Other Ambulatory Visit: Payer: Self-pay

## 2023-09-30 NOTE — Patient Outreach (Addendum)
Care Management   Visit Note  09/30/2023 Name: Shawn Rivas MRN: [id number] DOB: [birth year]  Subjective: Shawn Rivas is a 57 y.o. year old male who is a primary care patient of Smitty Cords, DO. The Care Management team was consulted for assistance.      Engaged with Shawn Rivas via telephone.  Assessment:  Review of patient past medical history, allergies, medications, health status, including review of consultants reports, laboratory and other test data, was performed as part of  evaluation and provision of care management services.    Outpatient Encounter Medications as of 09/30/2023  Medication Sig   ACCU-CHEK GUIDE TEST test strip Use to check blood sugar up to twice daily   Accu-Chek Softclix Lancets lancets Check blood sugar up to twice daily   ascorbic acid (VITAMIN C) 500 MG tablet Take 1,000 mg by mouth daily. (Patient not taking: Reported on 09/02/2023)   Blood Glucose Monitoring Suppl (ACCU-CHEK GUIDE) w/Device KIT Use to check blood sugar up to twice daily   busPIRone (BUSPAR) 5 MG tablet Take 1 tablet (5 mg total) by mouth 3 (three) times daily as needed (anxiety). (Patient not taking: Reported on 06/01/2023)   Continuous Blood Gluc Sensor (DEXCOM G6 SENSOR) MISC Use to check continuous blood sugar, apply new sensor every 10 days, 3 sensors for 30 days (Patient not taking: Reported on 09/02/2023)   Continuous Blood Gluc Transmit (DEXCOM G6 TRANSMITTER) MISC Use to check continuously blood sugar (Patient not taking: Reported on 09/02/2023)   gabapentin (NEURONTIN) 100 MG capsule Take 1-3 capsules (100-300 mg total) by mouth 3 (three) times daily. (Patient not taking: Reported on 09/02/2023)   insulin glargine (LANTUS SOLOSTAR) 100 UNIT/ML Solostar Pen Inject 40 Units into the skin at bedtime. (Patient not taking: Reported on 09/02/2023)   insulin lispro (HUMALOG KWIKPEN) 100 UNIT/ML KwikPen Inject 20 units twice a day with meal (Patient not taking: Reported on  06/01/2023)   lovastatin (MEVACOR) 40 MG tablet Take 1 tablet (40 mg total) by mouth daily. (Patient not taking: Reported on 06/01/2023)   melatonin 5 MG TABS Take 5 mg by mouth. (Patient not taking: Reported on 09/02/2023)   metFORMIN (GLUCOPHAGE-XR) 500 MG 24 hr tablet Take 2 tablets (1,000 mg total) by mouth daily with breakfast. (Patient not taking: Reported on 09/02/2023)   oxyCODONE (OXY IR/ROXICODONE) 5 MG immediate release tablet Take 1 tablet (5 mg total) by mouth every 4 (four) hours as needed for severe pain (pain score 7-10).   tamsulosin (FLOMAX) 0.4 MG CAPS capsule Take 1 capsule (0.4 mg total) by mouth daily. (Patient not taking: Reported on 06/01/2023)   valsartan (DIOVAN) 80 MG tablet Take 1 tablet (80 mg total) by mouth daily. (Patient not taking: Reported on 06/01/2023)   No facility-administered encounter medications on file as of 09/30/2023.      Goals      Care Management and Care Coordination Needs: HTN, DM, and  Depression     Care Management support and education needs related to HTN, DMII, Anxiety, and Depression  Knowledge Deficits related to plan of care for management of HTN, DMII, Anxiety, and Depression  Lacks caregiver support Financial Constraints  Non-adherence to scheduled provider appointments Non-adherence to prescribed medication regimen  Interventions Evaluation of current treatment plans and patient's adherence to plan as established by provider. Patient was scheduled for outreach with [facility] today. Reports being busy with moving items to a new location due to condition of his current home. Notes his daughter recently moved  out and home was left in poor condition. He reports noncompliance with medications and current treatment plans due to making arrangements to move. Reports he "plans to get on track next year." Declined offer to speak with Pharm D to review concerns related to medication management and prescription cost. Declined offer to outreach with a  Social Worker or [employer] to discuss options for financial and housing concerns. Requested to complete [facility] outreach on a later date. Prefers to discuss plan after the New Year. Appointment scheduled for 10/19/23. Thorough review of worsening s/sx that require immediate medical attention as he is reports not currently adhering to any medication or treatment recommendations. Agreed to contact clinic or seek urgent medical assistance if needed.             PLAN Appointment scheduled for next month as requested   Juanell Fairly [facility] RN Care Manager Direct Dial: [phone]  Fax: [phone] Website: [email or web address]

## 2023-10-15 ENCOUNTER — Telehealth: Payer: Self-pay

## 2023-10-15 ENCOUNTER — Ambulatory Visit: Payer: Self-pay

## 2023-10-15 NOTE — Telephone Encounter (Signed)
 Chief Complaint: Cough Symptoms: Dry Cough Frequency: constant  Pertinent Negatives: Patient denies fever, sputum, chest pain, nausea, vomiting  Disposition: [] ED /[] Urgent Care (no appt availability in office) / [x] Appointment(In office/virtual)/ []  [facility]/ [] Home Care/ [] Refused Recommended Disposition /[] [facility]/ []  Follow-up with PCP Additional Notes: Patient states he has had a dry cough since the Monday before Christmas. Patient states he is taking OTC cough syrup without much improvement. Patient is denying other symptoms related to the cough.  Patient also states he recently moved and is more active than usual and is getting winded more often. Patient states [place] mail[place] and due to his disability he does not feel safe crossing the road to get his mail. He was advised to request a letter from provider to state that it is unsafe due to health concern for patient to cross the road for mail and they will place a mailbox on his side of the road. Care advice was given and patient has been scheduled for an OV on Monday, patient is requesting to get tested for Covid while in the office. Advised if symptoms get worse over the weekend seek care at urgent care. Patient verbalized understanding. Reason for Disposition  [1] Continuous (nonstop) coughing interferes with work or school AND [2] no improvement using cough treatment per Care Advice  Answer Assessment - Initial Assessment Questions 1. ONSET: When did the cough begin?      Monday before Christmas  2. SEVERITY: How bad is the cough today?      Moderate (worse at night)  3. SPUTUM: Describe the color of your sputum (none, dry cough; clear, white, yellow, green)     None 4. HEMOPTYSIS: Are you coughing up any blood? If so ask: How much? (flecks, streaks, tablespoons, etc.)     No  5. DIFFICULTY BREATHING: Are you having difficulty breathing? If Yes, ask: How bad  is it? (e.g., mild, moderate, severe)    - MILD: No SOB at rest, mild SOB with walking, speaks normally in sentences, can lie down, no retractions, pulse < 100.    - MODERATE: SOB at rest, SOB with minimal exertion and prefers to sit, cannot lie down flat, speaks in phrases, mild retractions, audible wheezing, pulse 100-120.    - SEVERE: Very SOB at rest, speaks in single words, struggling to breathe, sitting hunched forward, retractions, pulse > 120      Mild  6. FEVER: Do you have a fever? If Yes, ask: What is your temperature, how was it measured, and when did it start?     No  7. CARDIAC HISTORY: Do you have any history of heart disease? (e.g., heart attack, congestive heart failure)      No  8. LUNG HISTORY: Do you have any history of lung disease?  (e.g., pulmonary embolus, asthma, emphysema)     No  9. PE RISK FACTORS: Do you have a history of blood clots? (or: recent major surgery, recent prolonged travel, bedridden)     No  10. OTHER SYMPTOMS: Do you have any other symptoms? (e.g., runny nose, wheezing, chest pain)       No  Protocols used: Cough - Acute Non-Productive-A-AH

## 2023-10-15 NOTE — Telephone Encounter (Signed)
 Copied from CRM [id number]. Topic: General - Other >> Oct 11, 2023 11:32 AM Shawn Rivas wrote: Reason for CRM: The patient has called to request a letter from their Doctor requesting for their mailbox to be moved to a safer side of the street due to their disability concerns   Please contact further when possible

## 2023-10-15 NOTE — Telephone Encounter (Signed)
 Letter has been written and signed. I have it at my desk. He is scheduled for apt on Monday 10/18/23 for cough and possible COVID. He can pick it up Monday or if need we can mail it to him.  Marsa Officer, DO [facility] 10/15/2023, 5:33 PM

## 2023-10-15 NOTE — Telephone Encounter (Signed)
 I already answered the repeat phone request. See note there.  Letter written for patient.  Saralyn Pilar, DO [facility] 10/15/2023, 5:34 PM

## 2023-10-18 ENCOUNTER — Ambulatory Visit: Payer: Medicare Other | Admitting: Family Medicine

## 2023-10-18 NOTE — Telephone Encounter (Signed)
 No further action needed at this time. Will discuss at upcoming visit today.

## 2023-10-19 ENCOUNTER — Telehealth: Payer: Self-pay

## 2023-10-19 ENCOUNTER — Other Ambulatory Visit: Payer: Self-pay

## 2023-10-19 NOTE — Patient Outreach (Signed)
  Care Management   Outreach Note  10/19/2023 Name: Shawn Rivas MRN: [id number] DOB: 10-31-1965  An unsuccessful outreach attempt was made today for a scheduled Care Management visit.    Follow Up Plan:  A HIPAA compliant phone message was left for the patient providing contact information and requesting a return call.   Fele  regan [facility]  [facility], [facility] RN Care Manager Direct Dial : [phone] Website: [email or web address]

## 2023-10-20 ENCOUNTER — Ambulatory Visit: Payer: Medicare Other | Admitting: Family Medicine

## 2023-10-21 ENCOUNTER — Encounter: Payer: Self-pay | Admitting: Internal Medicine

## 2023-10-21 ENCOUNTER — Ambulatory Visit (INDEPENDENT_AMBULATORY_CARE_PROVIDER_SITE_OTHER): Payer: Medicare Other | Admitting: Internal Medicine

## 2023-10-21 VITALS — BP 138/80 | HR 119 | Resp 20 | Ht 70.0 in | Wt 234.6 lb

## 2023-10-21 DIAGNOSIS — Z794 Long term (current) use of insulin: Secondary | ICD-10-CM

## 2023-10-21 DIAGNOSIS — R053 Chronic cough: Secondary | ICD-10-CM

## 2023-10-21 DIAGNOSIS — E1169 Type 2 diabetes mellitus with other specified complication: Secondary | ICD-10-CM

## 2023-10-21 DIAGNOSIS — S90414A Abrasion, right lesser toe(s), initial encounter: Secondary | ICD-10-CM

## 2023-10-21 DIAGNOSIS — L03119 Cellulitis of unspecified part of limb: Secondary | ICD-10-CM

## 2023-10-21 LAB — POC COVID19 BINAXNOW: SARS Coronavirus 2 Ag: NEGATIVE

## 2023-10-21 MED ORDER — DOXYCYCLINE HYCLATE 100 MG PO TABS: 100.0000 mg | ORAL_TABLET | Freq: Two times a day (BID) | ORAL | 0 refills | Status: DC
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
## 2023-10-21 NOTE — Patient Instructions (Signed)

## 2023-11-24 ENCOUNTER — Other Ambulatory Visit: Payer: Self-pay

## 2023-11-29 NOTE — Patient Outreach (Signed)
  Care Management   Visit Note   Name: Shawn Rivas MRN: [id number] DOB: August 06, 1966  Subjective: Shawn Rivas is a 58 y.o. year old male who is a primary care patient of Smitty Cords, DO. The Care Management team was consulted for assistance.      Call received from [name]. Reports being evaluated in the clinic for cough and respiratory symptoms and requesting to schedule outreach with the care team. Denies urgent concerns. Reports being unable to complete outreach today. He is agreeable to outreach later this month. Anticipates being available on 12/03/23. Agreed to contact the clinic or call team if he requires urgent assistance prior to scheduled outreach.   PLAN:  Appointment scheduled for 12/03/23.    Juanell Fairly [facility] RN Care Manager Direct Dial: [phone]  Fax: [phone] Website: [email or web address]

## 2023-12-03 ENCOUNTER — Other Ambulatory Visit: Payer: Medicare Other

## 2023-12-03 NOTE — Patient Outreach (Signed)
  Care Management   Visit Note  12/03/2023 Name: Shawn Rivas MRN: [id number] DOB: Dec 27, 1965  Subjective: [name] is a 58 y.o. year old male who is a primary care patient of Althea Charon, Netta Neat, DO.    Mr. Cropper was scheduled for outreach today. Reports being in a restaurant at time of call. Requested to reschedule outreach for next week. Anticipates being available on 12/07/23. Agreed to contact the clinic if he requires urgent assistance prior to the scheduled outreach.   PLAN Appointment rescheduled for 12/07/23    Juanell Fairly [facility] RN Care Manager Direct Dial: [phone]  Fax: [phone] Website: [email or web address]

## 2023-12-07 ENCOUNTER — Other Ambulatory Visit: Payer: Self-pay | Admitting: Family Medicine

## 2023-12-07 ENCOUNTER — Other Ambulatory Visit: Payer: Self-pay

## 2023-12-07 DIAGNOSIS — G8921 Chronic pain due to trauma: Secondary | ICD-10-CM

## 2023-12-07 DIAGNOSIS — I10 Essential (primary) hypertension: Secondary | ICD-10-CM

## 2023-12-07 DIAGNOSIS — I872 Venous insufficiency (chronic) (peripheral): Secondary | ICD-10-CM

## 2023-12-07 DIAGNOSIS — I739 Peripheral vascular disease, unspecified: Secondary | ICD-10-CM

## 2023-12-07 DIAGNOSIS — E1169 Type 2 diabetes mellitus with other specified complication: Secondary | ICD-10-CM

## 2023-12-07 MED ORDER — OXYCODONE HCL 5 MG PO TABS: 5.0000 mg | ORAL_TABLET | ORAL | 0 refills | Status: DC | PRN

## 2023-12-07 NOTE — Patient Outreach (Signed)
 Care Management   Visit Note  12/07/2023 Name: Shawn Rivas MRN: [id number] DOB: 1966-10-03  Subjective: Shawn Rivas is a 58 y.o. year old male who is a primary care patient of Smitty Cords, DO. Engaged with Shawn Rivas via telephone today.   Assessment Review of patient past medical history, allergies, medications, health status, including review of consultants reports, laboratory and other test data, was performed as part of  evaluation and provision of care management services.    Outpatient Encounter Medications as of 12/07/2023  Medication Sig   ACCU-CHEK GUIDE TEST test strip Use to check blood sugar up to twice daily   Accu-Chek Softclix Lancets lancets Check blood sugar up to twice daily   ascorbic acid (VITAMIN C) 500 MG tablet Take 1,000 mg by mouth daily. (Patient not taking: Reported on 11/24/2023)   Blood Glucose Monitoring Suppl (ACCU-CHEK GUIDE) w/Device KIT Use to check blood sugar up to twice daily   busPIRone (BUSPAR) 5 MG tablet Take 1 tablet (5 mg total) by mouth 3 (three) times daily as needed (anxiety). (Patient not taking: Reported on 11/24/2023)   Continuous Blood Gluc Sensor (DEXCOM G6 SENSOR) MISC Use to check continuous blood sugar, apply new sensor every 10 days, 3 sensors for 30 days (Patient not taking: Reported on 09/02/2023)   Continuous Blood Gluc Transmit (DEXCOM G6 TRANSMITTER) MISC Use to check continuously blood sugar (Patient not taking: Reported on 09/02/2023)   doxycycline (VIBRA-TABS) 100 MG tablet Take 1 tablet (100 mg total) by mouth 2 (two) times daily.   gabapentin (NEURONTIN) 100 MG capsule Take 1-3 capsules (100-300 mg total) by mouth 3 (three) times daily. (Patient not taking: Reported on 11/24/2023)   insulin glargine (LANTUS SOLOSTAR) 100 UNIT/ML Solostar Pen Inject 40 Units into the skin at bedtime. (Patient not taking: Reported on 11/24/2023)   insulin lispro (HUMALOG KWIKPEN) 100 UNIT/ML KwikPen Inject 20 units twice a day with  meal (Patient not taking: Reported on 11/24/2023)   lovastatin (MEVACOR) 40 MG tablet Take 1 tablet (40 mg total) by mouth daily. (Patient not taking: Reported on 11/24/2023)   melatonin 5 MG TABS Take 5 mg by mouth. (Patient not taking: Reported on 11/24/2023)   metFORMIN (GLUCOPHAGE-XR) 500 MG 24 hr tablet Take 2 tablets (1,000 mg total) by mouth daily with breakfast. (Patient not taking: Reported on 11/24/2023)   tamsulosin (FLOMAX) 0.4 MG CAPS capsule Take 1 capsule (0.4 mg total) by mouth daily. (Patient not taking: Reported on 11/24/2023)   valsartan (DIOVAN) 80 MG tablet Take 1 tablet (80 mg total) by mouth daily. (Patient not taking: Reported on 11/24/2023)   [DISCONTINUED] oxyCODONE (OXY IR/ROXICODONE) 5 MG immediate release tablet Take 1 tablet (5 mg total) by mouth every 4 (four) hours as needed for severe pain (pain score 7-10).   No facility-administered encounter medications on file as of 12/07/2023.     Interventions Today    Flowsheet Row Most Recent Value  Chronic Disease   Chronic disease during today's visit Diabetes, Hypertension (HTN)  General Interventions   General Interventions Discussed/Reviewed General Interventions Reviewed, Labs, Durable Medical Equipment (DME), [facility], Doctor Visits, Communication with  Labs Kidney Function  [Hgb A1c]  Doctor Visits Discussed/Reviewed PCP  [Discussed need to schedule appointment with primary provider]  Horticulturist, commercial (DME) Other  [Requesting a ramp for his home. Also requesting to transition back to [facility] for continuous glucose monitoring]  PCP/Specialist Visits Compliance with follow-up visit  [Discussed need to schedule clinic visit with primary  care provider]  Communication with PCP/Specialists  [Discussed case with primary care provider.]  Education Interventions   Education Provided Provided Education  Provided Verbal Education On Nutrition, Labs, Blood Sugar Monitoring, Medication, When to see the doctor,  [employer] Reviewed Hgb A1c, Kidney Function  Nutrition Interventions   Nutrition Discussed/Reviewed Nutrition Reviewed, Carbohydrate meal planning, Adding fruits and vegetables, Portion sizes, Decreasing sugar intake, Increasing proteins, Decreasing fats, Decreasing salt  Pharmacy Interventions   Pharmacy Dicussed/Reviewed Medication Adherence, Medications and their functions  Medication Adherence Not taking medication  [facility] team Pharmacist regarding need for follow up outreach related to medication adherence]  Safety Interventions   Safety Discussed/Reviewed Safety Reviewed, Fall Risk  Advanced Directive Interventions   Advanced Directives Discussed/Reviewed Advanced Directives Discussed  [Will send updated information via patient instructions]        PLAN Shawn Rivas agreed to follow up next week    Juanell Fairly [facility] RN Care Manager Direct Dial: [phone]  Fax: [phone] Website: [email or web address]

## 2023-12-07 NOTE — Patient Instructions (Signed)
 Thank you for allowing the Care Management team to participate in your care. It was great speaking with you today!   Reminders: -A referral was placed for outreach with a [employer] or Social Worker to assist you with obtaining a ramp for your home. -Your primary care provider has refilled your prescription for pain medication. -A message was relayed to the team Pharmacist. She will contact you regarding the concerns we discussed related to medication management and medication adherence. -We will follow up on December 17, 2023 at 1015.   Please do not hesitate to contact me if you require assistance prior to our next outreach.    Juanell Fairly [facility] Population Health RN Care Manager Direct Dial: [phone]  Fax: [phone] Website: [email or web address]

## 2023-12-09 ENCOUNTER — Telehealth: Payer: Self-pay

## 2023-12-09 NOTE — Progress Notes (Signed)
 Complex Care Management Note Care Guide Note  12/09/2023 Name: Shawn Rivas MRN: [id number] DOB: 04/04/1966   Complex Care Management Outreach Attempts: An unsuccessful telephone outreach was attempted today to offer the patient information about available complex care management services.  Follow Up Plan:  Additional outreach attempts will be made to offer the patient complex care management information and services.   Encounter Outcome:  No Answer  Amy Belloso [facility]  [facility] Guide Direct Dial: [phone]  Fax: [phone] Website: [email or web address]

## 2023-12-10 ENCOUNTER — Telehealth: Payer: Self-pay

## 2023-12-10 NOTE — Progress Notes (Signed)
 Complex Care Management Note Care Guide Note  12/10/2023 Name: Shawn Rivas MRN: [id number] DOB: Jun 02, 1966  Shawn Rivas is a 58 y.o. year old male who is a primary care patient of Smitty Cords, DO . The community resource team was consulted for assistance with Home Modifications and wheelchair ramp.  SDOH screenings and interventions completed:  No        Care guide performed the following interventions: Patient was unavailable to talk. I will call him Monday 12/13/23 as requested.     Follow Up Plan:  Patient was unavailable to talk. I will call him Monday 12/13/23 as requested.     Encounter Outcome:  Patient Request to Call Back  Tyerra Loretto [facility]  [facility] Guide Direct Dial: [phone]  Fax: [phone] Website: [email or web address]

## 2023-12-14 ENCOUNTER — Telehealth: Payer: Self-pay

## 2023-12-14 NOTE — Progress Notes (Signed)
 Complex Care Management Note Care Guide Note  12/14/2023 Name: Shawn Rivas MRN: [id number] DOB: 1966/01/15  Shawn Rivas is a 58 y.o. year old male who is a primary care patient of Smitty Cords, DO . The community resource team was consulted for assistance with Food Insecurity and Home Modifications  SDOH screenings and interventions completed:  Yes  Social Drivers of Health From This Encounter   Food Insecurity: Food Insecurity Present (12/14/2023)   Hunger Vital Sign    Worried About Running Out of Food in the Last Year: Often true    Ran Out of Food in the Last Year: Often true  Housing: Low Risk  (12/14/2023)   Housing Stability Vital Sign    Unable to Pay for Housing in the Last Year: No    Number of Times Moved in the Last Year: 0    Homeless in the Last Year: No  Financial Resource Strain: Medium Risk (12/14/2023)   Overall Financial Resource Strain (CARDIA)    Difficulty of Paying Living Expenses: Somewhat hard  Transportation Needs: No Transportation Needs (12/14/2023)   PRAPARE - Administrator, Civil Service (Medical): No    Lack of Transportation (Non-Medical): No  Utilities: Patient Declined (12/14/2023)   Utilities    Threatened with loss of utilities: Patient declined    SDOH Interventions Today    Flowsheet Row Most Recent Value  SDOH Interventions   Food Insecurity Interventions Other (Comment)  [Emailed information for food stamps per patient request.]  Utilities Interventions Patient Declined        Care guide performed the following interventions: Spoke with patient he gave consent to send [id number] referral to Independent Living for wheelchair ramp. Per patient request emailed information for food stamps. Patient stated he does not have a mailbox.  Follow Up Plan:  No further follow up planned at this time. The patient has been provided with needed resources.  Encounter Outcome:  Patient Visit Completed  Sunday Klos [facility]   [facility] Guide Direct Dial: [phone]  Fax: [phone] Website: [email or web address]

## 2023-12-16 ENCOUNTER — Telehealth: Payer: Self-pay

## 2023-12-16 NOTE — Progress Notes (Signed)
 Care Guide Pharmacy Note  12/16/2023 Name: Shawn Rivas MRN: [id number] DOB: March 20, 1966  Referred By: Smitty Cords, DO Reason for referral: Care Coordination (Outreach to schedule with Pharm d )   Shawn Rivas is a 58 y.o. year old male who is a primary care patient of Smitty Cords, DO.  [name] was referred to the pharmacist for assistance related to: DMII  An unsuccessful telephone outreach was attempted today to contact the patient who was referred to the pharmacy team for assistance with medication management. Additional attempts will be made to contact the patient.  Penne Lash , RMA     [facility]  [facility] Guide  Direct Dial: [phone]  Website: [email or web address]

## 2023-12-17 ENCOUNTER — Other Ambulatory Visit: Payer: Self-pay

## 2023-12-17 NOTE — Patient Instructions (Addendum)
 Thank you for allowing the Care Management team to participate in your care.    We will follow up on December 24, 2023 at 0945. Please do not hesitate to contact me if you require assistance prior to our next outreach.    Juanell Fairly [facility] Population Health RN Care Manager Direct Dial: [phone]  Fax: [phone] Website: [email or web address]

## 2023-12-17 NOTE — Patient Outreach (Signed)
 Care Management   Visit Note  12/17/2023 Name: Shawn Rivas MRN: [id number] DOB: 1966/03/27  Subjective: Shawn Rivas is a 58 y.o. year old male who is a primary care patient of Smitty Cords, DO. Engaged with Shawn Rivas via telephone today.   Assessment:  Review of patient past medical history, allergies, medications, health status, including review of consultants reports, laboratory and other test data, was performed as part of  evaluation and provision of care management services.   Outpatient Encounter Medications as of 12/17/2023  Medication Sig   ACCU-CHEK GUIDE TEST test strip Use to check blood sugar up to twice daily   Accu-Chek Softclix Lancets lancets Check blood sugar up to twice daily   ascorbic acid (VITAMIN C) 500 MG tablet Take 1,000 mg by mouth daily. (Patient not taking: Reported on 11/24/2023)   Blood Glucose Monitoring Suppl (ACCU-CHEK GUIDE) w/Device KIT Use to check blood sugar up to twice daily   busPIRone (BUSPAR) 5 MG tablet Take 1 tablet (5 mg total) by mouth 3 (three) times daily as needed (anxiety). (Patient not taking: Reported on 11/24/2023)   Continuous Blood Gluc Sensor (DEXCOM G6 SENSOR) MISC Use to check continuous blood sugar, apply new sensor every 10 days, 3 sensors for 30 days (Patient not taking: Reported on 09/02/2023)   Continuous Blood Gluc Transmit (DEXCOM G6 TRANSMITTER) MISC Use to check continuously blood sugar (Patient not taking: Reported on 09/02/2023)   doxycycline (VIBRA-TABS) 100 MG tablet Take 1 tablet (100 mg total) by mouth 2 (two) times daily.   gabapentin (NEURONTIN) 100 MG capsule Take 1-3 capsules (100-300 mg total) by mouth 3 (three) times daily. (Patient not taking: Reported on 11/24/2023)   insulin glargine (LANTUS SOLOSTAR) 100 UNIT/ML Solostar Pen Inject 40 Units into the skin at bedtime. (Patient not taking: Reported on 11/24/2023)   insulin lispro (HUMALOG KWIKPEN) 100 UNIT/ML KwikPen Inject 20 units twice a day with  meal (Patient not taking: Reported on 11/24/2023)   lovastatin (MEVACOR) 40 MG tablet Take 1 tablet (40 mg total) by mouth daily. (Patient not taking: Reported on 11/24/2023)   melatonin 5 MG TABS Take 5 mg by mouth. (Patient not taking: Reported on 11/24/2023)   metFORMIN (GLUCOPHAGE-XR) 500 MG 24 hr tablet Take 2 tablets (1,000 mg total) by mouth daily with breakfast. (Patient not taking: Reported on 11/24/2023)   oxyCODONE (OXY IR/ROXICODONE) 5 MG immediate release tablet Take 1 tablet (5 mg total) by mouth every 4 (four) hours as needed for severe pain (pain score 7-10).   tamsulosin (FLOMAX) 0.4 MG CAPS capsule Take 1 capsule (0.4 mg total) by mouth daily. (Patient not taking: Reported on 11/24/2023)   valsartan (DIOVAN) 80 MG tablet Take 1 tablet (80 mg total) by mouth daily. (Patient not taking: Reported on 11/24/2023)   No facility-administered encounter medications on file as of 12/17/2023.    Interventions:   Interventions Today    Flowsheet Row Most Recent Value  Chronic Disease   Chronic disease during today's visit Diabetes, Hypertension (HTN), Other  [Fall Risk]  General Interventions   General Interventions Discussed/Reviewed General Interventions Reviewed, Durable Medical Equipment (DME), Doctor Visits, Communication with, Community Resources  [Pharmacy]  Doctor Visits Discussed/Reviewed Doctor Visits Reviewed, PCP  Horticulturist, commercial (DME) Glucomoter, Other  [Patient to obtain glucometer and wheel chair ramp]  PCP/Specialist Visits Compliance with follow-up visit  [Patient aware of need to schedule PCP visit]  Communication with --  [Pharmacy regarding pending prescriptions]  Exercise Interventions   Exercise  Discussed/Reviewed Assistive device use and maintanence  Education Interventions   Education Provided Provided Education  Provided Verbal Education On Nutrition, Blood Sugar Monitoring, Labs, Foot Care, Eye Care, Medication, When to see the doctor, [facility]   [Discussed plan to obtain glucometer and blood pressure monitoring device]  Labs Reviewed Hgb A1c, Kidney Function  Nutrition Interventions   Nutrition Discussed/Reviewed Nutrition Reviewed, Carbohydrate meal planning, Fluid intake, Portion sizes, Decreasing sugar intake, Increasing proteins, Decreasing fats, Adding fruits and vegetables, Decreasing salt  Pharmacy Interventions   Pharmacy Dicussed/Reviewed Medications and their functions, Medication Adherence  [Patient aware of pending prescriptions/Pending outreach with pharmacist regarding medication adherence]  Medication Adherence Not taking medication  Safety Interventions   Safety Discussed/Reviewed Safety Reviewed       PLAN Shawn Rivas agreed to follow up outreach next week.    Juanell Fairly [facility] Population Health RN Care Manager Direct Dial: [phone]  Fax: [phone] Website: [email or web address]

## 2023-12-23 NOTE — Progress Notes (Signed)
 Complex Care Management Note Care Guide Note  12/23/2023 Name: Shawn Rivas MRN: [id number] DOB: 05/26/1966   Complex Care Management Outreach Attempts: An unsuccessful telephone outreach was attempted today to offer the patient information about available complex care management services.  Follow Up Plan:  Additional outreach attempts will be made to offer the patient complex care management information and services.   Encounter Outcome:  No Answer  Nadim Malia [facility]  [facility] Guide Direct Dial: [phone]  Fax: [phone] Website: [email or web address]

## 2023-12-24 ENCOUNTER — Other Ambulatory Visit: Payer: Self-pay

## 2023-12-24 NOTE — Patient Instructions (Addendum)
 Thank you for allowing the Care Management team to participate in your care. It was great speaking with you!   Reminders: Please follow up as scheduled with Dr. Alberteen Spindle on December 27, 2023 at 3:30 We will follow up on Thursday December 30, 2023 at 1100. Please do not hesitate to contact me if you require assistance prior to our next outreach.   Juanell Fairly [facility] Population Health RN Care Manager Direct Dial: [phone]  Fax: [phone] Website: [email or web address]

## 2023-12-24 NOTE — Patient Outreach (Signed)
 Care Management   Visit Note  12/24/2023 Name: Shawn Rivas MRN: [id number] DOB: [birth year]  Subjective: Shawn Rivas is a 58 y.o. year old male who is a primary care patient of Shawn Cords, DO. Engaged with Shawn Rivas via telephone today.  Assessment:  Review of patient past medical history, allergies, medications, health status, including review of consultants reports, laboratory and other test data, was performed as part of  evaluation and provision of care management services.   Outpatient Encounter Medications as of 12/24/2023  Medication Sig   ACCU-CHEK GUIDE TEST test strip Use to check blood sugar up to twice daily   Accu-Chek Softclix Lancets lancets Check blood sugar up to twice daily   ascorbic acid (VITAMIN C) 500 MG tablet Take 1,000 mg by mouth daily. (Patient not taking: Reported on 11/24/2023)   Blood Glucose Monitoring Suppl (ACCU-CHEK GUIDE) w/Device KIT Use to check blood sugar up to twice daily   busPIRone (BUSPAR) 5 MG tablet Take 1 tablet (5 mg total) by mouth 3 (three) times daily as needed (anxiety). (Patient not taking: Reported on 11/24/2023)   Continuous Blood Gluc Sensor (DEXCOM G6 SENSOR) MISC Use to check continuous blood sugar, apply new sensor every 10 days, 3 sensors for 30 days (Patient not taking: Reported on 09/02/2023)   Continuous Blood Gluc Transmit (DEXCOM G6 TRANSMITTER) MISC Use to check continuously blood sugar (Patient not taking: Reported on 09/02/2023)   doxycycline (VIBRA-TABS) 100 MG tablet Take 1 tablet (100 mg total) by mouth 2 (two) times daily.   gabapentin (NEURONTIN) 100 MG capsule Take 1-3 capsules (100-300 mg total) by mouth 3 (three) times daily. (Patient not taking: Reported on 11/24/2023)   insulin glargine (LANTUS SOLOSTAR) 100 UNIT/ML Solostar Pen Inject 40 Units into the skin at bedtime. (Patient not taking: Reported on 11/24/2023)   insulin lispro (HUMALOG KWIKPEN) 100 UNIT/ML KwikPen Inject 20 units twice a day with  meal (Patient not taking: Reported on 11/24/2023)   lovastatin (MEVACOR) 40 MG tablet Take 1 tablet (40 mg total) by mouth daily. (Patient not taking: Reported on 11/24/2023)   melatonin 5 MG TABS Take 5 mg by mouth. (Patient not taking: Reported on 11/24/2023)   metFORMIN (GLUCOPHAGE-XR) 500 MG 24 hr tablet Take 2 tablets (1,000 mg total) by mouth daily with breakfast. (Patient not taking: Reported on 11/24/2023)   oxyCODONE (OXY IR/ROXICODONE) 5 MG immediate release tablet Take 1 tablet (5 mg total) by mouth every 4 (four) hours as needed for severe pain (pain score 7-10).   tamsulosin (FLOMAX) 0.4 MG CAPS capsule Take 1 capsule (0.4 mg total) by mouth daily. (Patient not taking: Reported on 11/24/2023)   valsartan (DIOVAN) 80 MG tablet Take 1 tablet (80 mg total) by mouth daily. (Patient not taking: Reported on 11/24/2023)   No facility-administered encounter medications on file as of 12/24/2023.     Interventions:   Goals      Effective Management of Chronic Illnesses     Care Management support and education needs related to management of Chronic Illnesses Lacks caregiver support Limited Mobility Non-adherence to scheduled provider appointments Non-adherence to prescribed medication regimen   Interventions Today    Flowsheet Row Most Recent Value  Chronic Disease   Chronic disease during today's visit Diabetes  General Interventions   General Interventions Discussed/Reviewed General Interventions Reviewed, Annual Eye Exam, Labs, Annual Foot Exam, Durable Medical Equipment (DME), [facility], Doctor Visits, Communication with  Doctor Visits Discussed/Reviewed Doctor Visits Reviewed, Database administrator (  DME) Other  [Continous Monitoring Device or Standard Glucomter, Walker]  PCP/Specialist Visits Compliance with follow-up visit  [Pending evaluation by Podiatry/Dr. Alberteen Rivas on 12/27/23]  Communication with PCP/Specialists  [Communicated with Podiatry team. Patient  reported wounds and discoloration to both feet. Appointment scheduled for 12/27/23]  Education Interventions   Education Provided Provided Education  Provided Verbal Education On Foot Care, Nutrition, Eye Care, Labs, Blood Sugar Monitoring, Medication, When to see the doctor, [employer]  [Pending follow up with Shawn Rivas regarding wheelchair ramp.]  Nutrition Interventions   Nutrition Discussed/Reviewed Nutrition Reviewed  Pharmacy Interventions   Pharmacy Dicussed/Reviewed Pharmacy Topics Reviewed, Medications and their functions, Medication Adherence  Medication Adherence Not taking medication  Safety Interventions   Safety Discussed/Reviewed Safety Reviewed       Self-Care/Symptom Management Attend all scheduled provider appointments Take all medications as prescribed Monitor blood glucose and maintain a log Check feet daily for cuts, sores or redness Wash and dry feet carefully every day Wear comfortable, cotton socks Wear comfortable, well-fitting shoes with all ambulation Follow recommended safety and fall prevention measures Read food labels for fat, fiber, carbohydrates and portion size Complete appointment with the Podiatry clinic as scheduled on 12/27/23 Call provider office for new concerns or questions            PLAN Shawn Rivas will follow up with the Podiatry clinic on 12/27/23. Shawn Rivas agreed to follow up nursing outreach next week    Shawn Rivas [facility] RN Care Manager Direct Dial: [phone]  Fax: [phone] Website: [email or web address]

## 2023-12-27 DIAGNOSIS — E1140 Type 2 diabetes mellitus with diabetic neuropathy, unspecified: Secondary | ICD-10-CM | POA: Diagnosis not present

## 2023-12-27 DIAGNOSIS — Z872 Personal history of diseases of the skin and subcutaneous tissue: Secondary | ICD-10-CM | POA: Diagnosis not present

## 2023-12-27 DIAGNOSIS — B351 Tinea unguium: Secondary | ICD-10-CM | POA: Diagnosis not present

## 2023-12-27 DIAGNOSIS — Z794 Long term (current) use of insulin: Secondary | ICD-10-CM | POA: Diagnosis not present

## 2023-12-27 DIAGNOSIS — L97521 Non-pressure chronic ulcer of other part of left foot limited to breakdown of skin: Secondary | ICD-10-CM | POA: Diagnosis not present

## 2023-12-30 ENCOUNTER — Other Ambulatory Visit: Payer: Self-pay

## 2023-12-30 NOTE — Patient Outreach (Unsigned)
 Care Management   Visit Note  12/30/2023 Name: Shawn Rivas MRN: [id number] DOB: Jan 19, 1966  Subjective: Shawn Rivas is a 58 y.o. year old male who is a primary care patient of Smitty Cords, DO. Engaged with Mr. Hoecker via telephone today.  Assessment:  Review of patient past medical history, allergies, medications, health status, including review of consultants reports, laboratory and other test data, was performed as part of  evaluation and provision of care management services.    Outpatient Encounter Medications as of 12/30/2023  Medication Sig   ACCU-CHEK GUIDE TEST test strip Use to check blood sugar up to twice daily   Accu-Chek Softclix Lancets lancets Check blood sugar up to twice daily   ascorbic acid (VITAMIN C) 500 MG tablet Take 1,000 mg by mouth daily. (Patient not taking: Reported on 11/24/2023)   Blood Glucose Monitoring Suppl (ACCU-CHEK GUIDE) w/Device KIT Use to check blood sugar up to twice daily   busPIRone (BUSPAR) 5 MG tablet Take 1 tablet (5 mg total) by mouth 3 (three) times daily as needed (anxiety). (Patient not taking: Reported on 11/24/2023)   Continuous Blood Gluc Sensor (DEXCOM G6 SENSOR) MISC Use to check continuous blood sugar, apply new sensor every 10 days, 3 sensors for 30 days (Patient not taking: Reported on 09/02/2023)   Continuous Blood Gluc Transmit (DEXCOM G6 TRANSMITTER) MISC Use to check continuously blood sugar (Patient not taking: Reported on 09/02/2023)   doxycycline (VIBRA-TABS) 100 MG tablet Take 1 tablet (100 mg total) by mouth 2 (two) times daily.   gabapentin (NEURONTIN) 100 MG capsule Take 1-3 capsules (100-300 mg total) by mouth 3 (three) times daily. (Patient not taking: Reported on 11/24/2023)   insulin glargine (LANTUS SOLOSTAR) 100 UNIT/ML Solostar Pen Inject 40 Units into the skin at bedtime. (Patient not taking: Reported on 11/24/2023)   insulin lispro (HUMALOG KWIKPEN) 100 UNIT/ML KwikPen Inject 20 units twice a day with  meal (Patient not taking: Reported on 11/24/2023)   lovastatin (MEVACOR) 40 MG tablet Take 1 tablet (40 mg total) by mouth daily. (Patient not taking: Reported on 11/24/2023)   melatonin 5 MG TABS Take 5 mg by mouth. (Patient not taking: Reported on 11/24/2023)   metFORMIN (GLUCOPHAGE-XR) 500 MG 24 hr tablet Take 2 tablets (1,000 mg total) by mouth daily with breakfast. (Patient not taking: Reported on 11/24/2023)   oxyCODONE (OXY IR/ROXICODONE) 5 MG immediate release tablet Take 1 tablet (5 mg total) by mouth every 4 (four) hours as needed for severe pain (pain score 7-10).   tamsulosin (FLOMAX) 0.4 MG CAPS capsule Take 1 capsule (0.4 mg total) by mouth daily. (Patient not taking: Reported on 11/24/2023)   valsartan (DIOVAN) 80 MG tablet Take 1 tablet (80 mg total) by mouth daily. (Patient not taking: Reported on 11/24/2023)   No facility-administered encounter medications on file as of 12/30/2023.    Interventions:  Interventions Today    Flowsheet Row Most Recent Value  Chronic Disease   Chronic disease during today's visit Diabetes, Other  [Respiratory Virus]  General Interventions   General Interventions Discussed/Reviewed General Interventions Discussed, Annual Eye Exam, Labs, Annual Foot Exam, [facility], Doctor Visits, Communication with  Labs Hgb A1c every 6 months  Doctor Visits Discussed/Reviewed Doctor Visits Reviewed, Specialist, PCP  Durable Medical Equipment (DME) Other  [Reports needing new supplies to monitor blood sugars. Reports submitting previous request for Dexcom. Reports pending documentation from provider/pharmacist and approval from insurance provider. Will message assigned pharmacist today regarding status]  PCP/Specialist Visits  Compliance with follow-up visit  [Attended appointment with Podiatry as scheduled on 12/27/23. Pending scheduling of outreach with clinic Pharmacist. Nurse Manager will message assigned Pharmacist today and schedule clinic visit with Primary  Care Provider]  Communication with Pharmacists  Exercise Interventions   Exercise Discussed/Reviewed Assistive device use and maintanence  Education Interventions   Education Provided Provided Education  Provided Verbal Education On Nutrition, Foot Care, Eye Care, Labs, Blood Sugar Monitoring, Mental Health/Coping with Illness, Medication, When to see the doctor  Labs Reviewed Hgb A1c, Kidney Function, Lipid Profile  Mental Health Interventions   Mental Health Discussed/Reviewed Coping Strategies  Nutrition Interventions   Nutrition Discussed/Reviewed Nutrition Reviewed, Carbohydrate meal planning, Adding fruits and vegetables, Fluid intake, Decreasing sugar intake, Increasing proteins, Decreasing fats, Decreasing salt  Pharmacy Interventions   Pharmacy Dicussed/Reviewed Pharmacy Topics Reviewed, Medications and their functions, Medication Adherence  Medication Adherence Not taking medication  Safety Interventions   Safety Discussed/Reviewed Safety Reviewed, Fall Risk       PLAN Mr. Dunwoody agreed to follow up next month    [facility] RN Care Manager Direct Dial: [phone]  Fax: [phone] Website: [email or web address]

## 2023-12-30 NOTE — Patient Instructions (Signed)
 Thank you for allowing the Care Management team to participate in your care.  It was great speaking with you today!

## 2023-12-31 NOTE — Progress Notes (Signed)
 Care Guide Pharmacy Note  12/31/2023 Name: Shawn Rivas MRN: [id number] DOB: [birth year]  Referred By: Smitty Cords, DO Reason for referral: Care Coordination (Outreach to schedule with pharm d )   Shawn Rivas is a 58 y.o. year old male who is a primary care patient of Smitty Cords, DO.  Shawn Rivas was referred to the pharmacist for assistance related to: HTN, HLD, and DMII  A second unsuccessful telephone outreach was attempted today to contact the patient who was referred to the pharmacy team for assistance with medication management. Additional attempts will be made to contact the patient.  Shawn Rivas , RMA     [facility]  [facility], [facility] Guide  Direct Dial: [phone]  Website: [email or web address]

## 2024-01-06 ENCOUNTER — Telehealth: Payer: Self-pay

## 2024-01-06 NOTE — Progress Notes (Signed)
 Complex Care Management Note Care Guide Note  01/06/2024 Name: Shawn Rivas MRN: [id number] DOB: 1965/11/24  Shawn Rivas is a 58 y.o. year old male who is a primary care patient of Smitty Cords, DO . The community resource team was consulted for assistance with Home Modifications and wheelchair ramp.  SDOH screenings and interventions completed:  No        Care guide performed the following interventions: Received email message from [facility] at [facility] patients referral was rejected after numerous attempts to contact the patient. Michelle left messages on 3/4, 3/6, 3/10, 3/24 and 3/27 never received response from patient.   Follow Up Plan:  No further follow up planned at this time. The patient has been provided with needed resources.  Encounter Outcome:  Patient Visit Completed  Joanna [facility]  [facility] Guide Direct Dial: [phone]  Fax: [phone] Website: [email or web address]

## 2024-01-06 NOTE — Progress Notes (Signed)
 Care Guide Pharmacy Note  01/06/2024 Name: PROSPERO MAHNKE MRN: [id number] DOB: 1966/04/08  Referred By: Smitty Cords, DO Reason for referral: Care Coordination (Outreach to schedule with pharm d )   DEXTON ZWILLING is a 58 y.o. year old male who is a primary care patient of Smitty Cords, DO.  Cletis Athens Nance was referred to the pharmacist for assistance related to: DMII  A third unsuccessful telephone outreach was attempted today to contact the patient who was referred to the pharmacy team for assistance with medication assistance. The Population Health team is pleased to engage with this patient at any time in the future upon receipt of referral and should he/she be interested in assistance from the [employer] [facility] team.  Penne Lash , RMA     [facility]  [facility], [facility] Guide  Direct Dial: [phone]  Website: [email or web address]

## 2024-01-13 ENCOUNTER — Other Ambulatory Visit: Payer: Self-pay

## 2024-01-13 ENCOUNTER — Telehealth: Payer: Self-pay

## 2024-01-13 NOTE — Patient Outreach (Signed)
  Care Management   Outreach Note  01/13/2024 Name: Shawn Rivas MRN: [id number] DOB: 10/09/1966  An unsuccessful outreach attempt was made today for a scheduled Care Management visit.   Follow Up Plan:  A HIPAA compliant phone message was left for the patient providing contact information and requesting a return call.     Juanell Fairly [facility] Population Health RN Care Manager Direct Dial: [phone]  Fax: [phone] Website: [email or web address]

## 2024-02-23 ENCOUNTER — Encounter: Payer: Self-pay | Admitting: Family Medicine

## 2024-02-23 DIAGNOSIS — G8921 Chronic pain due to trauma: Secondary | ICD-10-CM

## 2024-02-23 MED ORDER — OXYCODONE HCL 5 MG PO TABS: 5.0000 mg | ORAL_TABLET | ORAL | 0 refills | Status: DC | PRN

## 2024-03-03 ENCOUNTER — Telehealth: Payer: Self-pay

## 2024-03-14 ENCOUNTER — Encounter: Payer: Self-pay | Admitting: Family Medicine

## 2024-03-14 ENCOUNTER — Ambulatory Visit (INDEPENDENT_AMBULATORY_CARE_PROVIDER_SITE_OTHER): Admitting: Family Medicine

## 2024-03-14 VITALS — BP 130/84 | HR 103 | Ht 70.0 in | Wt 215.0 lb

## 2024-03-14 DIAGNOSIS — Z794 Long term (current) use of insulin: Secondary | ICD-10-CM | POA: Diagnosis not present

## 2024-03-14 DIAGNOSIS — Z0000 Encounter for general adult medical examination without abnormal findings: Secondary | ICD-10-CM

## 2024-03-14 DIAGNOSIS — R32 Unspecified urinary incontinence: Secondary | ICD-10-CM | POA: Diagnosis not present

## 2024-03-14 DIAGNOSIS — F419 Anxiety disorder, unspecified: Secondary | ICD-10-CM

## 2024-03-14 DIAGNOSIS — Z125 Encounter for screening for malignant neoplasm of prostate: Secondary | ICD-10-CM | POA: Diagnosis not present

## 2024-03-14 DIAGNOSIS — R29818 Other symptoms and signs involving the nervous system: Secondary | ICD-10-CM

## 2024-03-14 DIAGNOSIS — R351 Nocturia: Secondary | ICD-10-CM

## 2024-03-14 DIAGNOSIS — E1142 Type 2 diabetes mellitus with diabetic polyneuropathy: Secondary | ICD-10-CM | POA: Diagnosis not present

## 2024-03-14 DIAGNOSIS — R531 Weakness: Secondary | ICD-10-CM | POA: Diagnosis not present

## 2024-03-14 DIAGNOSIS — I10 Essential (primary) hypertension: Secondary | ICD-10-CM

## 2024-03-14 DIAGNOSIS — E1169 Type 2 diabetes mellitus with other specified complication: Secondary | ICD-10-CM | POA: Diagnosis not present

## 2024-03-14 DIAGNOSIS — G4719 Other hypersomnia: Secondary | ICD-10-CM

## 2024-03-14 MED ORDER — ALPRAZOLAM 1 MG PO TABS: 1.0000 mg | ORAL_TABLET | Freq: Every day | ORAL | 0 refills | Status: DC | PRN
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
## 2024-03-14 NOTE — Patient Instructions (Addendum)
 Thank you for coming to the office today.  Orderex Xanax, take cautiously, avoid taking with oxycodone   Labs and urine today  We will coordinate with Felecia to discuss new walker if possible, and status of ramp.  Shortness of breath can be various causes. I would recommend CT imaging in future if not improving. Let's check labs next and follow progress.  I will re order the Physical Therapy  Home Sleep Study has been ordered through [facility]. They will setup when you are ready.  Start Tresiba insulin  back to 40 units daily for now, we can order at pharmacy next.  Freestyle Libre 3 Plus, 1 month supply free and we can order more if we can get it covered.  -------------------------------  You have been referred for a Coronary Calcium Score Cardiac CT Scan. This is a screening test for patients aged 58-50+ with cardiovascular risk factors or who are healthy but would be interested in Cardiovascular Screening for heart disease. Even if there is a family history of heart disease, this imaging can be useful. Typically it can be done every 5+ years or at a different timeline we agree on  The scan will look at the chest and mainly focus on the heart and identify early signs of calcium build up or blockages within the heart arteries. It is not 100% accurate for identifying blockages or heart disease, but it is useful to help us  predict who may have some early changes or be at risk in the future for a heart attack or cardiovascular problem.  The results are reviewed by a Cardiologist and they will document the results. It should become available on MyChart. Typically the results are divided into percentiles based on other patients of the same demographic and age. So it will compare your risk to others similar to you. If you have a higher score >99 or higher percentile >75%tile, it is recommended to consider Statin cholesterol therapy and or referral to Cardiologist. I will try to help  explain your results and if we have questions we can contact the Cardiologist.  You will be contacted for scheduling. Usually it is done at any imaging facility through [facility], [facility] or [facility].  The cost is $99 flat fee total and it does not go through insurance, so no authorization is required.    Please schedule a Follow-up Appointment to: Return in about 3 months (around 06/14/2024) for 3 month DM A1c, Anxiety, Pain, updates.  If you have any other questions or concerns, please feel free to call the office or send a message through MyChart. You may also schedule an earlier appointment if necessary.  Additionally, you may be receiving a survey about your experience at our office within a few days to 1 week by e-mail or mail. We value your feedback.  Domingo Friend, DO [facility], [place]

## 2024-03-15 ENCOUNTER — Ambulatory Visit: Payer: Self-pay | Admitting: Family Medicine

## 2024-03-16 LAB — CBC WITH DIFFERENTIAL/PLATELET
Absolute Lymphocytes: 2106 {cells}/uL (ref 850–3900)
Absolute Monocytes: 591 {cells}/uL (ref 200–950)
Basophils Absolute: 113 {cells}/uL (ref 0–200)
Basophils Relative: 1.4 %
Eosinophils Absolute: 219 {cells}/uL (ref 15–500)
Eosinophils Relative: 2.7 %
HCT: 49.1 % (ref 38.5–50.0)
Hemoglobin: 16 g/dL (ref 13.2–17.1)
MCH: 30.2 pg (ref 27.0–33.0)
MCHC: 32.6 g/dL (ref 32.0–36.0)
MCV: 92.6 fL (ref 80.0–100.0)
MPV: 11 fL (ref 7.5–12.5)
Monocytes Relative: 7.3 %
Neutro Abs: 5071 {cells}/uL (ref 1500–7800)
Neutrophils Relative %: 62.6 %
Platelets: 394 10*3/uL (ref 140–400)
RBC: 5.3 10*6/uL (ref 4.20–5.80)
RDW: 11.9 % (ref 11.0–15.0)
Total Lymphocyte: 26 %
WBC: 8.1 10*3/uL (ref 3.8–10.8)

## 2024-03-16 LAB — URINALYSIS, ROUTINE W REFLEX MICROSCOPIC
Bacteria, UA: NONE SEEN /HPF
Bilirubin Urine: NEGATIVE
Hgb urine dipstick: NEGATIVE
Ketones, ur: NEGATIVE
Leukocytes,Ua: NEGATIVE
Nitrite: NEGATIVE
RBC / HPF: NONE SEEN /HPF (ref 0–2)
Specific Gravity, Urine: 1.034 (ref 1.001–1.035)
Squamous Epithelial / HPF: NONE SEEN /HPF (ref <=5)
WBC, UA: NONE SEEN /HPF (ref 0–5)
pH: <=5 — AB (ref 5.0–8.0)

## 2024-03-16 LAB — LIPID PANEL
Cholesterol: 255 mg/dL — ABNORMAL HIGH (ref <200)
HDL: 50 mg/dL (ref >=40)
LDL Cholesterol (Calc): 158 mg/dL — ABNORMAL HIGH
Non-HDL Cholesterol (Calc): 205 mg/dL — ABNORMAL HIGH (ref <130)
Total CHOL/HDL Ratio: 5.1 (calc) — ABNORMAL HIGH (ref <5.0)
Triglycerides: 284 mg/dL — ABNORMAL HIGH (ref <150)

## 2024-03-16 LAB — COMPREHENSIVE METABOLIC PANEL WITH GFR
AG Ratio: 1.1 (calc) (ref 1.0–2.5)
ALT: 17 U/L (ref 9–46)
AST: 16 U/L (ref 10–35)
Albumin: 3.9 g/dL (ref 3.6–5.1)
Alkaline phosphatase (APISO): 140 U/L (ref 35–144)
BUN/Creatinine Ratio: 17 (calc) (ref 6–22)
BUN: 25 mg/dL (ref 7–25)
CO2: 23 mmol/L (ref 20–32)
Calcium: 10 mg/dL (ref 8.6–10.3)
Chloride: 97 mmol/L — ABNORMAL LOW (ref 98–110)
Creat: 1.43 mg/dL — ABNORMAL HIGH (ref 0.70–1.30)
Globulin: 3.7 g/dL (ref 1.9–3.7)
Glucose, Bld: 510 mg/dL (ref 65–99)
Potassium: 4.8 mmol/L (ref 3.5–5.3)
Sodium: 131 mmol/L — ABNORMAL LOW (ref 135–146)
Total Bilirubin: 0.5 mg/dL (ref 0.2–1.2)
Total Protein: 7.6 g/dL (ref 6.1–8.1)
eGFR: 57 mL/min/{1.73_m2} — ABNORMAL LOW (ref >=60)

## 2024-03-16 LAB — MICROALBUMIN / CREATININE URINE RATIO
Creatinine, Urine: 66 mg/dL (ref 20–320)
Microalb Creat Ratio: 2665 mg/g{creat} — ABNORMAL HIGH (ref <30)
Microalb, Ur: 175.9 mg/dL

## 2024-03-16 LAB — URINE CULTURE
MICRO NUMBER:: 16533346
Result:: NO GROWTH
SPECIMEN QUALITY:: ADEQUATE

## 2024-03-16 LAB — HEMOGLOBIN A1C: Hgb A1c MFr Bld: >14 % — ABNORMAL HIGH (ref <5.7)

## 2024-03-16 LAB — PSA: PSA: 0.09 ng/mL (ref <=4.00)

## 2024-03-16 LAB — MICROSCOPIC MESSAGE

## 2024-03-16 LAB — TSH: TSH: 2.12 m[IU]/L (ref 0.40–4.50)

## 2024-03-21 ENCOUNTER — Other Ambulatory Visit: Payer: Self-pay

## 2024-03-21 NOTE — Patient Instructions (Signed)
Thank you for allowing the Chronic Care Management team to participate in your care.  
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
## 2024-04-03 ENCOUNTER — Other Ambulatory Visit: Payer: Self-pay

## 2024-04-03 ENCOUNTER — Ambulatory Visit

## 2024-04-03 DIAGNOSIS — E1169 Type 2 diabetes mellitus with other specified complication: Secondary | ICD-10-CM

## 2024-04-03 DIAGNOSIS — I10 Essential (primary) hypertension: Secondary | ICD-10-CM

## 2024-04-04 NOTE — Patient Instructions (Addendum)
 Thank you for allowing the Complex Care Management team to participate in your care. It was great speaking with you!   Reminders: -A referral was placed for outreach with the Embedded Pharmacist regarding your request for assistance with insulin . -I will follow up with  [employer] regarding the supplies for you continuous glucose monitoring device. -A message was left for Rosaline Barrio regarding the request for a wheelchair ramp.   We will follow up on April 17, 2024 at 1230. Please do not hesitate to contact me if you require assistance prior to our next outreach.   Jackson [facility] Population Health RN Care Manager Direct Dial : [phone]  Fax: [phone] Website: [email or web address]
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
## 2024-04-05 ENCOUNTER — Other Ambulatory Visit: Payer: Self-pay | Admitting: Family Medicine

## 2024-04-05 ENCOUNTER — Other Ambulatory Visit: Admitting: Pharmacist

## 2024-04-05 ENCOUNTER — Encounter: Payer: Self-pay | Admitting: Pharmacist

## 2024-04-05 ENCOUNTER — Telehealth: Payer: Self-pay

## 2024-04-05 ENCOUNTER — Telehealth: Payer: Self-pay | Admitting: Pharmacist

## 2024-04-05 ENCOUNTER — Other Ambulatory Visit (HOSPITAL_COMMUNITY): Payer: Self-pay

## 2024-04-05 DIAGNOSIS — Z794 Long term (current) use of insulin: Secondary | ICD-10-CM

## 2024-04-05 DIAGNOSIS — E1169 Type 2 diabetes mellitus with other specified complication: Secondary | ICD-10-CM

## 2024-04-05 MED ORDER — TRESIBA FLEXTOUCH 100 UNIT/ML ~~LOC~~ SOPN
40.0000 [IU] | PEN_INJECTOR | Freq: Every day | SUBCUTANEOUS | 1 refills | Status: DC
  Filled 2024-04-07: qty 12, 30d supply, fill #0

## 2024-04-05 MED ORDER — DEXCOM G7 SENSOR MISC: 3 refills | Status: DC

## 2024-04-05 MED ORDER — ONETOUCH VERIO FLEX SYSTEM W/DEVICE KIT: PACK | 0 refills | Status: DC

## 2024-04-05 MED ORDER — ONETOUCH DELICA PLUS LANCET30G MISC: 3 refills | Status: DC

## 2024-04-05 MED ORDER — ONETOUCH VERIO VI STRP: ORAL_STRIP | 3 refills | Status: DC

## 2024-04-05 NOTE — Telephone Encounter (Signed)
 Thank you! I have printed last office visit note and printed Rx for G7 sensors 90 day and will have it ready to fax to [employer]  Marsa Officer, DO [facility] 04/05/2024, 5:45 PM

## 2024-04-05 NOTE — Progress Notes (Signed)
 04/05/2024 Name: Shawn Rivas MRN: [id number] DOB: 19-Apr-1966  Chief Complaint  Patient presents with   Medication Management   Medication Assistance    Shawn Rivas is a 58 y.o. year old male who presented for a telephone visit.   They were referred to the pharmacist by their Case Management Team  for assistance in managing diabetes and medication access.   Conversation today limited as patient is not currently home. Conversation ends abruptly when phone line goes silent. Was unable to reach patient again via telephone today and have left HIPAA compliant voicemail asking patient to return my call.   Subjective:  Care Team: Primary Care Provider: Edman Marsa PARAS, DO ; Next Scheduled Visit: 06/26/2024 Nurse Care Manager: Karoline Lima, RN; Next Scheduled Visit: 04/17/2024  Medication Access/Adherence  Current Pharmacy:  [id number] [place], [place] - [address] [place] [address] Phone: [phone] Fax: [phone]   Patient reports affordability concerns with their medications: Yes  Patient reports access/transportation concerns to their pharmacy: No  Patient reports adherence concerns with their medications:  Yes    Patient shares that his prescription medications are difficult to afford. Reports can pick up a 1 month supply of Tresiba  insulin  for $35, but requesting assistance with long-term cost  Reports previously had Dexcom continuous glucose monitor and would like to have this again, but needs a new order sent to his DME company, [employer]  Diabetes:  Current medications: Tresiba  40 units daily (restarted last night) Had stopped for ~1 week when his previous Freestyle Libre 3 Plus sensor (from sample) ran out  Medications tried in the past: Humalog ; metformin  ER  Started new Freestyle Libre 3 Plus sensor last night  Current glucose readings: Reports currently over 350   Denies currently having a glucometer due to  cost     Objective:  Lab Results  Component Value Date   HGBA1C >14.0 (H) 03/14/2024    Lab Results  Component Value Date   CREATININE 1.43 (H) 03/14/2024   BUN 25 03/14/2024   NA 131 (L) 03/14/2024   K 4.8 03/14/2024   CL 97 (L) 03/14/2024   CO2 23 03/14/2024    Lab Results  Component Value Date   CHOL 255 (H) 03/14/2024   HDL 50 03/14/2024   LDLCALC 158 (H) 03/14/2024   TRIG 284 (H) 03/14/2024   CHOLHDL 5.1 (H) 03/14/2024    Current Outpatient Medications on File Prior to Visit  Medication Sig Dispense Refill   ALPRAZolam  (XANAX ) 1 MG tablet Take 1 tablet (1 mg total) by mouth daily as needed for anxiety. 30 tablet 0   ascorbic acid (VITAMIN C) 500 MG tablet Take 1,000 mg by mouth daily. (Patient not taking: Reported on 09/02/2023)     gabapentin  (NEURONTIN ) 100 MG capsule Take 1-3 capsules (100-300 mg total) by mouth 3 (three) times daily. (Patient not taking: Reported on 09/02/2023) 90 capsule 2   insulin  lispro (HUMALOG  KWIKPEN) 100 UNIT/ML KwikPen Inject 20 units twice a day with meal (Patient not taking: Reported on 06/01/2023) 15 mL 3   lovastatin  (MEVACOR ) 40 MG tablet Take 1 tablet (40 mg total) by mouth daily. (Patient not taking: Reported on 06/01/2023) 90 tablet 1   melatonin 5 MG TABS Take 5 mg by mouth. (Patient not taking: Reported on 09/02/2023)     metFORMIN  (GLUCOPHAGE -XR) 500 MG 24 hr tablet Take 2 tablets (1,000 mg total) by mouth daily with breakfast. (Patient not taking: Reported on 09/02/2023)  180 tablet 1   tamsulosin  (FLOMAX ) 0.4 MG CAPS capsule Take 1 capsule (0.4 mg total) by mouth daily. (Patient not taking: Reported on 03/14/2024) 90 capsule 1   valsartan  (DIOVAN ) 80 MG tablet Take 1 tablet (80 mg total) by mouth daily. (Patient not taking: Reported on 06/01/2023) 90 tablet 1   No current facility-administered medications on file prior to visit.       Assessment/Plan:   [place] today limited as patient is not currently home.  Conversation ends abruptly when phone line goes silent.  - Based on reported income, patient meets criteria for Extra Help subsidy through Social Security Patient to complete this application online. Send patient MyChart message with details on how to apply for Extra Help online Counsel to expect response regarding approval or denial from Social Security via mail within the next 4 to 6 weeks after submitting application online. Advise that even if she/he is denied, to retain denial letter as this will be needed for applying for manufacturer patient assistance program.   Diabetes: - Currently uncontrolled - Will collaborate with PCP to request provider send prescription for Tresiba  insulin  [facility] - Will send prescription for One Touch Verio glucometer and testing supplies to [facility] for patient per protocol - Outreach to [employer] on behalf of patient. Speak with Amber who advises that patient's insurance coverage is no longer contracted with [employer], instead is with [employer] [facility] [phone]) - Contact [employer] and speak with representative Ellie Will collaborate with PCP to request office send latest office visit note and prescription for 90 day supply of Dexcom G7 sensors via fax to DME company: [employer] at fax # [phone]  - Recommend to continue to use Freestyle Libre CGM to monitor blood sugar/as feedback on dietary choices Recommend to check glucose with fingerstick check when needed for symptoms and as back up to CGM. Patient to contact office if needed for readings outside of established parameters or symptoms Patient plans to switch to Dexcom G7 CGM when able to receive this through DME   Follow Up Plan: Clinical Pharmacist will attempt to reach patient by telephone again within the next 14 days  Sharyle Sia, PharmD, [facility] Clinical Pharmacist [facility] [phone]

## 2024-04-05 NOTE — Telephone Encounter (Signed)
 Pharmacy Patient Advocate Encounter  Insurance verification completed.   The patient is insured through [employer] claim for [employer]. Currently a quantity of 15ml is a 30 day supply and the co-pay is $35.00 .   This test claim was processed through [facility]- copay amounts may vary at other pharmacies due to pharmacy/plan contracts, or as the patient moves through the different stages of their insurance plan.

## 2024-04-05 NOTE — Telephone Encounter (Signed)
 Patient requesting to receive Dexcom G7 sensors through DME. Find that [facility] is now the preferred DME provider for diabetes supplies through patient's [employer] plan.  Would you please have both a copy of latest office visit note and prescription for 3 month supply of the Dexcom G7 sensors faxed to [facility] Attention: [facility] at [phone]?  Thank you!  Sharyle Sharyle Sia, PharmD, [facility] Clinical Pharmacist [facility] [phone]

## 2024-04-06 ENCOUNTER — Other Ambulatory Visit: Payer: Self-pay

## 2024-04-06 ENCOUNTER — Telehealth: Payer: Self-pay | Admitting: Pharmacist

## 2024-04-06 ENCOUNTER — Encounter: Payer: Self-pay | Admitting: Family Medicine

## 2024-04-06 DIAGNOSIS — G8921 Chronic pain due to trauma: Secondary | ICD-10-CM

## 2024-04-06 DIAGNOSIS — E1169 Type 2 diabetes mellitus with other specified complication: Secondary | ICD-10-CM

## 2024-04-06 DIAGNOSIS — Z794 Long term (current) use of insulin: Secondary | ICD-10-CM

## 2024-04-06 MED ORDER — OXYCODONE HCL 5 MG PO TABS
5.0000 mg | ORAL_TABLET | ORAL | 0 refills | Status: DC | PRN
Start: 2024-04-06 — End: 2024-05-09

## 2024-04-06 NOTE — Telephone Encounter (Signed)
 Speak with patient regarding options for obtaining a 30 day supply of Tresiba insulin  so that his copayment will be $35 for 1 month supply. Note current [facility] for [facility] day supply; copayment is $70, which he expresses is unaffordable.  - Send secure chat message to [facility] to request pharmacy transfer prescription from [facility] split box of Tresiba pens for patient in order to fill for 30 day supply. Patient requesting to pick up from pharmacy either Friday or next week.   Follow Up Plan: Clinical Pharmacist will attempt to reach patient by telephone on 04/21/2024 at 12:00 PM    Sharyle Sia, PharmD, [facility] Clinical Pharmacist [facility] [phone]

## 2024-04-07 ENCOUNTER — Other Ambulatory Visit (HOSPITAL_BASED_OUTPATIENT_CLINIC_OR_DEPARTMENT_OTHER): Payer: Self-pay

## 2024-04-07 ENCOUNTER — Other Ambulatory Visit: Payer: Self-pay

## 2024-04-07 NOTE — Patient Instructions (Addendum)
 You can access the Social Security site for the Extra Help application online at:    [email or web address]   Please watch for a response from Social Security by mail within the next 4 to 6 weeks to let you know if this application is approved or denied. For either outcome, please keep this letter for your records.   If your application for Extra Help is denied, we can assist you with applying to receive your insulin  directly from the manufacturer at no cost.   I reached out to [employer] today and found that this DME company is no longer contracted with your plan. Instead, [employer] is now working with [employer]. We will send the order/required documentation for the Dexcom G7 sensors to [place].    Please follow up with [facility] at [phone] in a couple of days regarding scheduling the shipment for your CGM sensors.   Thank you!   Sharyle Sia, PharmD, [facility] Clinical Pharmacist [facility] [phone]

## 2024-04-11 ENCOUNTER — Other Ambulatory Visit: Payer: Self-pay | Admitting: Family Medicine

## 2024-04-11 ENCOUNTER — Other Ambulatory Visit (HOSPITAL_BASED_OUTPATIENT_CLINIC_OR_DEPARTMENT_OTHER): Payer: Self-pay

## 2024-04-11 DIAGNOSIS — E1142 Type 2 diabetes mellitus with diabetic polyneuropathy: Secondary | ICD-10-CM

## 2024-04-11 DIAGNOSIS — R531 Weakness: Secondary | ICD-10-CM

## 2024-04-17 ENCOUNTER — Telehealth: Payer: Self-pay

## 2024-04-21 ENCOUNTER — Telehealth: Payer: Self-pay | Admitting: Pharmacist

## 2024-04-21 ENCOUNTER — Other Ambulatory Visit

## 2024-04-21 NOTE — Progress Notes (Signed)
   Outreach Note  04/21/2024 Name: DIMITRY HOLSWORTH MRN: [id number] DOB: 01-Sep-1966  Referred by: Edman Marsa PARAS, DO  Was unable to reach patient via telephone today and have left HIPAA compliant voicemail asking patient to return my call.    Follow Up Plan: Will attempt to reach patient by telephone again within the next 30 days  Sharyle Sia, PharmD, [facility] Clinical Pharmacist [facility] [phone]

## 2024-04-24 ENCOUNTER — Other Ambulatory Visit: Payer: Self-pay

## 2024-05-01 ENCOUNTER — Other Ambulatory Visit: Payer: Self-pay
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
## 2024-05-01 NOTE — Patient Instructions (Signed)
Thank you for allowing the Chronic Care Management team to participate in your care.  

## 2024-05-03 ENCOUNTER — Other Ambulatory Visit (INDEPENDENT_AMBULATORY_CARE_PROVIDER_SITE_OTHER): Admitting: Pharmacist

## 2024-05-03 ENCOUNTER — Telehealth: Payer: Self-pay | Admitting: Pharmacist

## 2024-05-03 DIAGNOSIS — Z794 Long term (current) use of insulin: Secondary | ICD-10-CM

## 2024-05-03 DIAGNOSIS — E1169 Type 2 diabetes mellitus with other specified complication: Secondary | ICD-10-CM

## 2024-05-03 NOTE — Progress Notes (Signed)
 05/03/2024 Name: Shawn Rivas MRN: [id number] DOB: 03/19/1966  Chief Complaint  Patient presents with   Medication Assistance   Medication Management    Shawn Rivas is a 58 y.o. year old male who presented for a telephone visit.   They were referred to the pharmacist by their Case Management Team  for assistance in managing diabetes and medication access.     Subjective:   Care Team: Primary Care Provider: Edman Marsa PARAS, DO ; Next Scheduled Visit: 06/26/2024 Nurse Care Manager: Karoline Lima, RN; Next Scheduled Visit: 05/15/2024 Urologist: Gaston Hamilton, MD; Next Scheduled Visit: 07/03/2024  Medication Access/Adherence  Current Pharmacy:  [id number] - [place], [place] - [address] [place] [address] Phone: [phone] Fax: [phone]   Patient reports affordability concerns with their medications: Yes  Patient reports access/transportation concerns to their pharmacy: No  Patient reports adherence concerns with their medications:  Yes      Today reports that has not yet contacted [facility] to schedule delivery of Dexcom G7 sensors. States will not call to follow up himself as becomes frustrated by automated call systems - Shares that RNCM tried to contact [facility] with him on Monday, but that company would not speak with him with additional caller on the line   Reports has not yet completed application for Extra Help subsidy through Social Security   Patient shares that he was recently bleeding from his foot on Monday. Thinks that he stepped on something. States bleeding has stopped, but still feels like something might be in his foot   Diabetes:   Current medications: Reports not currently taking: Tresiba  Insulin    Medications tried in the past: Humalog ; metformin  ER   Denies taking Tresiba  insulin  as has not been checking his blood sugar. He does not take insulin  without checking sugar.    Patient has One Touch  Verio glucometer, but reports difficulty with manipulating this device. Prefers to restart monitoring with Dexcom CGM   Objective:  Lab Results  Component Value Date   HGBA1C >14.0 (H) 03/14/2024    Lab Results  Component Value Date   CREATININE 1.43 (H) 03/14/2024   BUN 25 03/14/2024   NA 131 (L) 03/14/2024   K 4.8 03/14/2024   CL 97 (L) 03/14/2024   CO2 23 03/14/2024     Current Outpatient Medications on File Prior to Visit  Medication Sig Dispense Refill   ALPRAZolam  (XANAX ) 1 MG tablet Take 1 tablet (1 mg total) by mouth daily as needed for anxiety. 30 tablet 0   ascorbic acid (VITAMIN C) 500 MG tablet Take 1,000 mg by mouth daily. (Patient not taking: Reported on 09/02/2023)     Blood Glucose Monitoring Suppl (ONETOUCH VERIO FLEX SYSTEM) w/Device KIT Use to check blood sugar up to four times daily as directed 1 kit 0   Continuous Glucose Sensor (DEXCOM G7 SENSOR) MISC Use 1 sensor every 10 days to check glucose continuously as advised 9 each 3   gabapentin  (NEURONTIN ) 100 MG capsule Take 1-3 capsules (100-300 mg total) by mouth 3 (three) times daily. (Patient not taking: Reported on 09/02/2023) 90 capsule 2   glucose blood (ONETOUCH VERIO) test strip Use to check blood sugar up to four times daily as directed 400 each 3   insulin  lispro (HUMALOG  KWIKPEN) 100 UNIT/ML KwikPen Inject 20 units twice a day with meal (Patient not taking: Reported on 06/01/2023) 15 mL 3   Lancets (ONETOUCH DELICA PLUS LANCET30G) MISC  Use to check blood sugar up to four times daily as directed 400 each 3   lovastatin  (MEVACOR ) 40 MG tablet Take 1 tablet (40 mg total) by mouth daily. (Patient not taking: Reported on 06/01/2023) 90 tablet 1   melatonin 5 MG TABS Take 5 mg by mouth. (Patient not taking: Reported on 09/02/2023)     metFORMIN  (GLUCOPHAGE -XR) 500 MG 24 hr tablet Take 2 tablets (1,000 mg total) by mouth daily with breakfast. (Patient not taking: Reported on 09/02/2023) 180 tablet 1   oxyCODONE   (OXY IR/ROXICODONE ) 5 MG immediate release tablet Take 1 tablet (5 mg total) by mouth every 4 (four) hours as needed for severe pain (pain score 7-10). 30 tablet 0   tamsulosin  (FLOMAX ) 0.4 MG CAPS capsule Take 1 capsule (0.4 mg total) by mouth daily. (Patient not taking: Reported on 03/14/2024) 90 capsule 1   TRESIBA  FLEXTOUCH 100 UNIT/ML FlexTouch Pen Inject 40 Units into the skin daily. 36 mL 1   valsartan  (DIOVAN ) 80 MG tablet Take 1 tablet (80 mg total) by mouth daily. (Patient not taking: Reported on 06/01/2023) 90 tablet 1   No current facility-administered medications on file prior to visit.        Assessment/Plan:   Advised patient contact office today to let PCP know about injury to his foot. Patient declines. States that he does not want in person appointment. States aware of infection risk if something is still in his foot.   Encourage patient to still consider contacting office to discuss with PCP  Will send message to PCP to let him know  - Based on reported income, patient meets criteria for Extra Help subsidy through Social Security Patient to complete this application online. Send patient MyChart message with details on how to apply for Extra Help online Counsel to expect response regarding approval or denial from Social Security via mail within the next 4 to 6 weeks after submitting application online. Advise that even if she/he is denied, to retain denial letter as this will be needed for applying for manufacturer patient assistance program.  Navigates to the application page during our call and states that he will complete this online   Diabetes: - Currently uncontrolled - Recommend patient contact [facility] to schedule delivery of Dexcom G7 sensors  States will have a family member assist him with placing this call - Patient follow up with [facility] regarding pick up of Tresiba  refill - Recommend to continue to use Freestyle Libre CGM to  monitor blood sugar/as feedback on dietary choices Recommend to check glucose with fingerstick check when needed for symptoms and as back up to CGM. Patient to contact office if needed for readings outside of established parameters or symptoms Patient plans to switch to Dexcom G7 CGM when able to receive this through DME     Follow Up Plan: Clinical Pharmacist will attempt to reach patient by telephone again next month   Sharyle Sia, PharmD, [facility] Clinical Pharmacist [facility] [phone]

## 2024-05-03 NOTE — Progress Notes (Signed)
 During call today, patient reported that he had significant bleeding from his foot on Monday. Thinks he stepped on something. Said bleeding has stopped, but still feels like something might be in his foot.   Advised him to contact office today about injury, but he declined. Said that he does not want in person appointment. States aware of infection risk if something is still in his foot. Again encouraged him to call for guidance.   Sharyle Sia, PharmD, [facility] Clinical Pharmacist [facility] [phone]

## 2024-05-03 NOTE — Patient Instructions (Addendum)
 Please follow up with [facility] at [phone] in a couple of days regarding scheduling the shipment for your Dexcom G7 CGM sensors.   You can access the Social Security site for the Extra Help application online at:    [email or web address]   Please watch for a response from Social Security by mail within the next 4 to 6 weeks to let you know if this application is approved or denied. For either outcome, please keep this letter for your records.   If your application for Extra Help is denied, we can assist you with applying to receive your insulin  directly from the manufacturer at no cost.  Sharyle Sia, PharmD, [employer] Clinical Pharmacist [facility] [phone]

## 2024-05-04 NOTE — Telephone Encounter (Signed)
 Stated his foot does not feel well, does not look infected, does not feel hot or look red. He did say the pain was worse last night when trying to go to sleep. Advised him to keep it elevated as much as possible and not do a lot of walking on it. Advised if anything changes he should call the office or seek treatment.

## 2024-05-08 ENCOUNTER — Encounter: Payer: Self-pay | Admitting: Family Medicine

## 2024-05-09 ENCOUNTER — Ambulatory Visit (INDEPENDENT_AMBULATORY_CARE_PROVIDER_SITE_OTHER): Admitting: Family Medicine

## 2024-05-09 ENCOUNTER — Encounter: Payer: Self-pay | Admitting: Family Medicine

## 2024-05-09 VITALS — BP 160/88 | HR 96 | Ht 70.0 in | Wt 224.0 lb

## 2024-05-09 DIAGNOSIS — L97525 Non-pressure chronic ulcer of other part of left foot with muscle involvement without evidence of necrosis: Secondary | ICD-10-CM

## 2024-05-09 DIAGNOSIS — F419 Anxiety disorder, unspecified: Secondary | ICD-10-CM

## 2024-05-09 DIAGNOSIS — E1142 Type 2 diabetes mellitus with diabetic polyneuropathy: Secondary | ICD-10-CM

## 2024-05-09 DIAGNOSIS — E11621 Type 2 diabetes mellitus with foot ulcer: Secondary | ICD-10-CM | POA: Diagnosis not present

## 2024-05-09 DIAGNOSIS — G8921 Chronic pain due to trauma: Secondary | ICD-10-CM

## 2024-05-09 MED ORDER — AMOXICILLIN-POT CLAVULANATE 875-125 MG PO TABS: 1.0000 | ORAL_TABLET | Freq: Two times a day (BID) | ORAL | 0 refills | Status: DC

## 2024-05-09 MED ORDER — OXYCODONE HCL 5 MG PO TABS: 5.0000 mg | ORAL_TABLET | ORAL | 0 refills | Status: DC | PRN

## 2024-05-09 MED ORDER — ALPRAZOLAM 1 MG PO TABS: 1.0000 mg | ORAL_TABLET | Freq: Every day | ORAL | 0 refills | Status: DC | PRN
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
## 2024-05-09 NOTE — Patient Instructions (Addendum)
 Thank you for coming to the office today.  Referral to Wound Center.  [facility] [address] [place],  [place]  [address] Main: [phone]   Please schedule a Follow-up Appointment to: Return if symptoms worsen or fail to improve.  If you have any other questions or concerns, please feel free to call the office or send a message through MyChart. You may also schedule an earlier appointment if necessary.  Additionally, you may be receiving a survey about your experience at our office within a few days to 1 week by e-mail or mail. We value your feedback.  Marsa Officer, DO [facility], [place]

## 2024-05-09 NOTE — Telephone Encounter (Signed)
 Spoke with patient, he stated this will be a close time for him but will try to make it.

## 2024-05-10 DIAGNOSIS — E1140 Type 2 diabetes mellitus with diabetic neuropathy, unspecified: Secondary | ICD-10-CM | POA: Diagnosis not present

## 2024-05-10 DIAGNOSIS — Z794 Long term (current) use of insulin: Secondary | ICD-10-CM | POA: Diagnosis not present

## 2024-05-10 DIAGNOSIS — L97522 Non-pressure chronic ulcer of other part of left foot with fat layer exposed: Secondary | ICD-10-CM | POA: Diagnosis not present

## 2024-05-10 NOTE — Telephone Encounter (Signed)
 Spoke with patient he saw Dr. Neill today and he wants to set up home health to help patient. Glendia is persistent that Felicia get involved in this. Trying to reach out to her.

## 2024-05-11 ENCOUNTER — Encounter: Payer: Self-pay | Admitting: Family Medicine

## 2024-05-12 NOTE — Telephone Encounter (Signed)
 Spoke with patient, he did have a VM from [facility], stated he would call her today. I asked him several times to reach out to her today concerning wound care.  He stated he has the number from the VM,  made sure it was the correct number.

## 2024-05-15 ENCOUNTER — Other Ambulatory Visit: Payer: Self-pay

## 2024-05-15 ENCOUNTER — Telehealth

## 2024-05-15 NOTE — Patient Instructions (Signed)
Thank you for allowing the Chronic Care Management team to participate in your care.  
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
## 2024-05-23 DIAGNOSIS — Z604 Social exclusion and rejection: Secondary | ICD-10-CM | POA: Diagnosis not present

## 2024-05-23 DIAGNOSIS — Z794 Long term (current) use of insulin: Secondary | ICD-10-CM | POA: Diagnosis not present

## 2024-05-23 DIAGNOSIS — E1140 Type 2 diabetes mellitus with diabetic neuropathy, unspecified: Secondary | ICD-10-CM | POA: Diagnosis not present

## 2024-05-23 DIAGNOSIS — L97422 Non-pressure chronic ulcer of left heel and midfoot with fat layer exposed: Secondary | ICD-10-CM | POA: Diagnosis not present

## 2024-05-23 DIAGNOSIS — E11621 Type 2 diabetes mellitus with foot ulcer: Secondary | ICD-10-CM | POA: Diagnosis not present

## 2024-05-23 DIAGNOSIS — Z556 Problems related to health literacy: Secondary | ICD-10-CM | POA: Diagnosis not present

## 2024-05-23 DIAGNOSIS — Z87820 Personal history of traumatic brain injury: Secondary | ICD-10-CM | POA: Diagnosis not present

## 2024-05-23 DIAGNOSIS — Z9181 History of falling: Secondary | ICD-10-CM | POA: Diagnosis not present

## 2024-05-25 DIAGNOSIS — Z9181 History of falling: Secondary | ICD-10-CM | POA: Diagnosis not present

## 2024-05-25 DIAGNOSIS — Z604 Social exclusion and rejection: Secondary | ICD-10-CM | POA: Diagnosis not present

## 2024-05-25 DIAGNOSIS — Z794 Long term (current) use of insulin: Secondary | ICD-10-CM | POA: Diagnosis not present

## 2024-05-25 DIAGNOSIS — E1140 Type 2 diabetes mellitus with diabetic neuropathy, unspecified: Secondary | ICD-10-CM | POA: Diagnosis not present

## 2024-05-25 DIAGNOSIS — Z87820 Personal history of traumatic brain injury: Secondary | ICD-10-CM | POA: Diagnosis not present

## 2024-05-25 DIAGNOSIS — L97422 Non-pressure chronic ulcer of left heel and midfoot with fat layer exposed: Secondary | ICD-10-CM | POA: Diagnosis not present

## 2024-05-25 DIAGNOSIS — E11621 Type 2 diabetes mellitus with foot ulcer: Secondary | ICD-10-CM | POA: Diagnosis not present

## 2024-05-25 DIAGNOSIS — Z556 Problems related to health literacy: Secondary | ICD-10-CM | POA: Diagnosis not present

## 2024-05-29 ENCOUNTER — Ambulatory Visit (INDEPENDENT_AMBULATORY_CARE_PROVIDER_SITE_OTHER): Admitting: Urology

## 2024-05-29 VITALS — BP 165/93 | HR 88 | Wt 225.2 lb

## 2024-05-29 DIAGNOSIS — L97422 Non-pressure chronic ulcer of left heel and midfoot with fat layer exposed: Secondary | ICD-10-CM | POA: Diagnosis not present

## 2024-05-29 DIAGNOSIS — Z604 Social exclusion and rejection: Secondary | ICD-10-CM | POA: Diagnosis not present

## 2024-05-29 DIAGNOSIS — Z9181 History of falling: Secondary | ICD-10-CM | POA: Diagnosis not present

## 2024-05-29 DIAGNOSIS — Z556 Problems related to health literacy: Secondary | ICD-10-CM | POA: Diagnosis not present

## 2024-05-29 DIAGNOSIS — Z87820 Personal history of traumatic brain injury: Secondary | ICD-10-CM | POA: Diagnosis not present

## 2024-05-29 DIAGNOSIS — E1140 Type 2 diabetes mellitus with diabetic neuropathy, unspecified: Secondary | ICD-10-CM | POA: Diagnosis not present

## 2024-05-29 DIAGNOSIS — Z794 Long term (current) use of insulin: Secondary | ICD-10-CM | POA: Diagnosis not present

## 2024-05-29 DIAGNOSIS — R32 Unspecified urinary incontinence: Secondary | ICD-10-CM

## 2024-05-29 DIAGNOSIS — E11621 Type 2 diabetes mellitus with foot ulcer: Secondary | ICD-10-CM | POA: Diagnosis not present

## 2024-05-29 LAB — URINALYSIS, COMPLETE
Bilirubin, UA: NEGATIVE
Ketones, UA: NEGATIVE
Leukocytes,UA: NEGATIVE
Nitrite, UA: NEGATIVE
Specific Gravity, UA: 1.02 (ref 1.005–1.030)
Urobilinogen, Ur: 0.2 mg/dL (ref 0.2–1.0)
pH, UA: 6 (ref 5.0–7.5)

## 2024-05-29 LAB — MICROSCOPIC EXAMINATION

## 2024-05-29 NOTE — Patient Instructions (Addendum)
 Scheduling number:  [phone]  Cystoscopy Cystoscopy is a procedure that is used to help diagnose and sometimes treat conditions that affect the lower urinary tract. The lower urinary tract includes the bladder and the urethra. The urethra is the tube that drains urine from the bladder. Cystoscopy is done using a thin, tube-shaped instrument with a light and camera at the end (cystoscope). The cystoscope may be hard or flexible, depending on the goal of the procedure. The cystoscope is inserted through the urethra, into the bladder. Cystoscopy may be recommended if you have: Urinary tract infections that keep coming back. Blood in the urine (hematuria). An inability to control when you urinate (urinary incontinence) or an overactive bladder. Unusual cells found in a urine sample. A blockage in the urethra, such as a urinary stone. Painful urination. An abnormality in the bladder found during an intravenous pyelogram (IVP) or CT scan. What are the risks? Generally, this is a safe procedure. However, problems may occur, including: Infection. Bleeding.  What happens during the procedure?  You will be given one or more of the following: A medicine to numb the area (local anesthetic). The area around the opening of your urethra will be cleaned. The cystoscope will be passed through your urethra into your bladder. Germ-free (sterile) fluid will flow through the cystoscope to fill your bladder. The fluid will stretch your bladder so that your health care provider can clearly examine your bladder walls. Your doctor will look at the urethra and bladder. The cystoscope will be removed The procedure may vary among health care providers  What can I expect after the procedure? After the procedure, it is common to have: Some soreness or pain in your urethra. Urinary symptoms. These include: Mild pain or burning when you urinate. Pain should stop within a few minutes after you urinate. This may  last for up to a few days after the procedure. A small amount of blood in your urine for several days. Feeling like you need to urinate but producing only a small amount of urine. Follow these instructions at home: General instructions Return to your normal activities as told by your health care provider.  Drink plenty of fluids after the procedure. Keep all follow-up visits as told by your health care provider. This is important. Contact a health care provider if you: Have pain that gets worse or does not get better with medicine, especially pain when you urinate lasting longer than 72 hours after the procedure. Have trouble urinating. Get help right away if you: Have blood clots in your urine. Have a fever or chills. Are unable to urinate. Summary Cystoscopy is a procedure that is used to help diagnose and sometimes treat conditions that affect the lower urinary tract. Cystoscopy is done using a thin, tube-shaped instrument with a light and camera at the end. After the procedure, it is common to have some soreness or pain in your urethra. It is normal to have blood in your urine after the procedure.  If you were prescribed an antibiotic medicine, take it as told by your health care provider.  This information is not intended to replace advice given to you by your health care provider. Make sure you discuss any questions you have with your health care provider. Document Revised: 09/20/2018 Document Reviewed: 09/20/2018 Elsevier Patient Education  2020 [employer].
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
## 2024-06-01 DIAGNOSIS — L97422 Non-pressure chronic ulcer of left heel and midfoot with fat layer exposed: Secondary | ICD-10-CM | POA: Diagnosis not present

## 2024-06-01 DIAGNOSIS — Z87820 Personal history of traumatic brain injury: Secondary | ICD-10-CM | POA: Diagnosis not present

## 2024-06-01 DIAGNOSIS — E1140 Type 2 diabetes mellitus with diabetic neuropathy, unspecified: Secondary | ICD-10-CM | POA: Diagnosis not present

## 2024-06-01 DIAGNOSIS — Z794 Long term (current) use of insulin: Secondary | ICD-10-CM | POA: Diagnosis not present

## 2024-06-01 DIAGNOSIS — Z556 Problems related to health literacy: Secondary | ICD-10-CM | POA: Diagnosis not present

## 2024-06-01 DIAGNOSIS — Z9181 History of falling: Secondary | ICD-10-CM | POA: Diagnosis not present

## 2024-06-01 DIAGNOSIS — E11621 Type 2 diabetes mellitus with foot ulcer: Secondary | ICD-10-CM | POA: Diagnosis not present

## 2024-06-01 DIAGNOSIS — Z604 Social exclusion and rejection: Secondary | ICD-10-CM | POA: Diagnosis not present

## 2024-06-01 LAB — CULTURE, URINE COMPREHENSIVE

## 2024-06-02 DIAGNOSIS — Z794 Long term (current) use of insulin: Secondary | ICD-10-CM | POA: Diagnosis not present

## 2024-06-02 DIAGNOSIS — L97522 Non-pressure chronic ulcer of other part of left foot with fat layer exposed: Secondary | ICD-10-CM | POA: Diagnosis not present

## 2024-06-02 DIAGNOSIS — E1140 Type 2 diabetes mellitus with diabetic neuropathy, unspecified: Secondary | ICD-10-CM | POA: Diagnosis not present

## 2024-06-02 DIAGNOSIS — B351 Tinea unguium: Secondary | ICD-10-CM | POA: Diagnosis not present

## 2024-06-03 ENCOUNTER — Encounter: Payer: Self-pay | Admitting: Emergency Medicine

## 2024-06-03 ENCOUNTER — Other Ambulatory Visit: Payer: Self-pay

## 2024-06-03 ENCOUNTER — Emergency Department

## 2024-06-03 ENCOUNTER — Emergency Department
Admission: EM | Admit: 2024-06-03 | Discharge: 2024-06-03 | Disposition: A | Discharge status: Home / Self Care | Attending: Emergency Medicine | Admitting: Emergency Medicine

## 2024-06-03 DIAGNOSIS — R3589 Other polyuria: Secondary | ICD-10-CM | POA: Diagnosis present

## 2024-06-03 DIAGNOSIS — R296 Repeated falls: Secondary | ICD-10-CM | POA: Insufficient documentation

## 2024-06-03 DIAGNOSIS — R739 Hyperglycemia, unspecified: Secondary | ICD-10-CM

## 2024-06-03 DIAGNOSIS — S0990XA Unspecified injury of head, initial encounter: Secondary | ICD-10-CM | POA: Diagnosis not present

## 2024-06-03 DIAGNOSIS — E1165 Type 2 diabetes mellitus with hyperglycemia: Secondary | ICD-10-CM | POA: Insufficient documentation

## 2024-06-03 DIAGNOSIS — R000 Tachycardia, unspecified: Secondary | ICD-10-CM | POA: Diagnosis not present

## 2024-06-03 DIAGNOSIS — R112 Nausea with vomiting, unspecified: Secondary | ICD-10-CM | POA: Diagnosis not present

## 2024-06-03 LAB — BASIC METABOLIC PANEL WITH GFR
Anion gap: 8 (ref 5–15)
BUN: 27 mg/dL — ABNORMAL HIGH (ref 6–20)
CO2: 26 mmol/L (ref 22–32)
Calcium: 9 mg/dL (ref 8.9–10.3)
Chloride: 99 mmol/L (ref 98–111)
Creatinine, Ser: 1.43 mg/dL — ABNORMAL HIGH (ref 0.61–1.24)
GFR, Estimated: 57 mL/min — ABNORMAL LOW (ref >60)
Glucose, Bld: 445 mg/dL — ABNORMAL HIGH (ref 70–99)
Potassium: 4 mmol/L (ref 3.5–5.1)
Sodium: 133 mmol/L — ABNORMAL LOW (ref 135–145)

## 2024-06-03 LAB — CBC WITH DIFFERENTIAL/PLATELET
Abs Immature Granulocytes: 0.07 K/uL (ref 0.00–0.07)
Basophils Absolute: 0.1 K/uL (ref 0.0–0.1)
Basophils Relative: 0 %
Eosinophils Absolute: 0 K/uL (ref 0.0–0.5)
Eosinophils Relative: 0 %
HCT: 40.5 % (ref 39.0–52.0)
Hemoglobin: 14 g/dL (ref 13.0–17.0)
Immature Granulocytes: 1 %
Lymphocytes Relative: 4 %
Lymphs Abs: 0.6 K/uL — ABNORMAL LOW (ref 0.7–4.0)
MCH: 30 pg (ref 26.0–34.0)
MCHC: 34.6 g/dL (ref 30.0–36.0)
MCV: 86.9 fL (ref 80.0–100.0)
Monocytes Absolute: 0.8 K/uL (ref 0.1–1.0)
Monocytes Relative: 5 %
Neutro Abs: 13.2 K/uL — ABNORMAL HIGH (ref 1.7–7.7)
Neutrophils Relative %: 90 %
Platelets: 365 K/uL (ref 150–400)
RBC: 4.66 MIL/uL (ref 4.22–5.81)
RDW: 12 % (ref 11.5–15.5)
WBC: 14.7 K/uL — ABNORMAL HIGH (ref 4.0–10.5)
nRBC: 0 % (ref 0.0–0.2)

## 2024-06-03 LAB — CBG MONITORING, ED
Glucose-Capillary: 483 mg/dL — ABNORMAL HIGH (ref 70–99)
Glucose-Capillary: 369 mg/dL — ABNORMAL HIGH (ref 70–99)

## 2024-06-03 LAB — TROPONIN I (HIGH SENSITIVITY): Troponin I (High Sensitivity): 23 ng/L — ABNORMAL HIGH (ref <18)

## 2024-06-03 LAB — CK: Total CK: 102 U/L (ref 49–397)

## 2024-06-03 MED ORDER — SODIUM CHLORIDE 0.9 % IV BOLUS
1000.0000 mL | Freq: Once | INTRAVENOUS | Status: AC
  Administered 2024-06-03: 1000 mL via INTRAVENOUS

## 2024-06-03 MED ORDER — INSULIN ASPART 100 UNIT/ML IJ SOLN
5.0000 [IU] | Freq: Once | INTRAMUSCULAR | Status: AC
  Administered 2024-06-03: 5 [IU] via INTRAVENOUS
  Filled 2024-06-03: qty 5

## 2024-06-03 MED ORDER — INSULIN ASPART 100 UNIT/ML IJ SOLN: 5.0000 [IU] | Freq: Once | INTRAMUSCULAR | Status: DC

## 2024-06-03 MED ORDER — METFORMIN HCL 500 MG PO TABS: 500.0000 mg | ORAL_TABLET | Freq: Two times a day (BID) | ORAL | 2 refills | Status: DC
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
## 2024-06-03 NOTE — ED Triage Notes (Signed)
 Pt says no pain and is has nausea and just doesn't feel good.  He just needs to sleep and get recharged.
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
## 2024-06-03 NOTE — Discharge Instructions (Addendum)
 You have high blood sugar.  I started you on metformin  which you should take daily as prescribed.  I think that your high blood sugar has caused you to get dehydrated.  Drink plenty of fluids to stay well-hydrated.  Take your time when sitting up and standing up as you may feel lightheaded when doing this.  The remainder of your blood testing and imaging did not show any emergency conditions.  I know that you have had trouble getting access to a blood sugar monitoring devices and also to your insulin .  Please call your primary doctor to continue to troubleshoot these issues and to recheck your blood sugar and adjustment of medications as needed.  Thank you for choosing us  for your health care today!  Please see your primary doctor this week for a follow up appointment.   If you have any new, worsening, or unexpected symptoms call your doctor right away or come back to the emergency department for reevaluation.  It was my pleasure to care for you today.   Ginnie EDISON Cyrena, MD

## 2024-06-05 ENCOUNTER — Other Ambulatory Visit (INDEPENDENT_AMBULATORY_CARE_PROVIDER_SITE_OTHER): Admitting: Pharmacist

## 2024-06-05 ENCOUNTER — Telehealth: Payer: Self-pay | Admitting: Pharmacist

## 2024-06-05 ENCOUNTER — Telehealth: Payer: Self-pay

## 2024-06-05 DIAGNOSIS — G8921 Chronic pain due to trauma: Secondary | ICD-10-CM

## 2024-06-05 DIAGNOSIS — Z556 Problems related to health literacy: Secondary | ICD-10-CM | POA: Diagnosis not present

## 2024-06-05 DIAGNOSIS — E11621 Type 2 diabetes mellitus with foot ulcer: Secondary | ICD-10-CM | POA: Diagnosis not present

## 2024-06-05 DIAGNOSIS — E1140 Type 2 diabetes mellitus with diabetic neuropathy, unspecified: Secondary | ICD-10-CM | POA: Diagnosis not present

## 2024-06-05 DIAGNOSIS — E1169 Type 2 diabetes mellitus with other specified complication: Secondary | ICD-10-CM

## 2024-06-05 DIAGNOSIS — Z794 Long term (current) use of insulin: Secondary | ICD-10-CM | POA: Diagnosis not present

## 2024-06-05 DIAGNOSIS — Z9181 History of falling: Secondary | ICD-10-CM | POA: Diagnosis not present

## 2024-06-05 DIAGNOSIS — Z87820 Personal history of traumatic brain injury: Secondary | ICD-10-CM | POA: Diagnosis not present

## 2024-06-05 DIAGNOSIS — L97422 Non-pressure chronic ulcer of left heel and midfoot with fat layer exposed: Secondary | ICD-10-CM | POA: Diagnosis not present

## 2024-06-05 DIAGNOSIS — Z604 Social exclusion and rejection: Secondary | ICD-10-CM | POA: Diagnosis not present

## 2024-06-05 MED ORDER — OXYCODONE HCL 5 MG PO TABS: 5.0000 mg | ORAL_TABLET | ORAL | 0 refills | Status: DC | PRN

## 2024-06-05 MED ORDER — ONETOUCH VERIO FLEX SYSTEM W/DEVICE KIT
PACK | 0 refills | Status: AC
Start: 2024-06-05 — End: ?

## 2024-06-05 MED ORDER — DEXCOM G7 SENSOR MISC: 3 refills | Status: DC

## 2024-06-05 MED ORDER — ONETOUCH DELICA PLUS LANCET30G MISC: 3 refills | Status: AC

## 2024-06-05 MED ORDER — ONETOUCH VERIO VI STRP: ORAL_STRIP | 3 refills | Status: AC

## 2024-06-05 NOTE — Telephone Encounter (Signed)
 Printed last 2 office visit notes and new rx Dexcom G7 for 90 day supply with refills to be faxed to [facility] as requested.  Marsa Officer, DO [facility] 06/05/2024, 5:27 PM

## 2024-06-05 NOTE — Patient Instructions (Signed)
 Please follow up with [facility] at [phone] in a couple of days regarding scheduling the shipment for your Dexcom G7 CGM sensors.     Sharyle Sia, PharmD, [facility] Clinical Pharmacist [facility] [phone]

## 2024-06-05 NOTE — Telephone Encounter (Signed)
 Copied from CRM #[id number]. Topic: Clinical - Medication Question >> Jun 05, 2024  9:15 AM Adelita E wrote: Reason for CRM: Lorn, home health nurse with [facility], called in and is requesting a continuous glucose monitor for the patient so he is able to check his blood sugar. Callback number for Lorn is [phone].

## 2024-06-05 NOTE — Telephone Encounter (Signed)
 Rx sent to pharmacy

## 2024-06-05 NOTE — Progress Notes (Signed)
 06/05/2024 Name: Shawn Rivas MRN: [id number] DOB: Mar 13, 1966  Chief Complaint  Patient presents with   Medication Management   Medication Adherence    Shawn Rivas is a 58 y.o. year old male who presented for a telephone visit.   They were referred to the pharmacist by their Case Management Team for assistance in managing diabetes and medication access.   Today speak with patient and he also requests that I speak with [facility] Nurse Lorn who is also with him today   Subjective:   Care Team: Primary Care Provider: Edman Marsa PARAS, DO ; Next Scheduled Visit: 06/26/2024 Nurse Care Manager: Karoline Lima, RN; Next Scheduled Visit: 07/04/2024 Urologist: Gaston Hamilton, MD; Next Scheduled Visit: 08/14/2024 Podiatry: Neill Krystal Fallow, DPM; Next Scheduled Visit: 06/29/2024  Medication Access/Adherence  Current Pharmacy:  [id number] - [place], [place] - [address] [place] [address] Phone: [phone] Fax: [phone]   Patient reports affordability concerns with their medications: Yes  Patient reports access/transportation concerns to their pharmacy: No  Patient reports adherence concerns with their medications:  Yes      From review of chart, note patient seen in [facility] ED on 06/03/2024 related to a Fall/Hyperglycemia  Note on 05/15/2024, RNCM again attempted to help patient to connect with [facility] to order his CGM supplies. However, patient became frustrated and unwilling to verify contact information and call ended  Home Health RN reports that she contacted [facility] with patient to schedule delivery of Dexcom G7 sensors and was advised that they did not have an order for the CGM sensors for patient - Lorn also shares that she is working to get patient setup also for physical therapy - Advises that Home Health RN team will return next on Thursday   Diabetes:   Current medications:  - Tresiba  40 units daily -  Reports restarted today after blood sugar checked by [facility] RN Had not been taking as has not been checking his blood sugar. He does not take insulin  without checking sugar.  - Plans to pick up metformin  500 mg prescription from pharmacy today and restart taking  Medications tried in the past: Humalog ; metformin  ER  Home Health RN reports blood sugar today: 384     Patient has One Touch Verio glucometer, but reports difficulty with manipulating this device. Requests to restart monitoring with Dexcom CGM   Objective:  Lab Results  Component Value Date   HGBA1C >14.0 (H) 03/14/2024    Lab Results  Component Value Date   CREATININE 1.43 (H) 06/03/2024   BUN 27 (H) 06/03/2024   NA 133 (L) 06/03/2024   K 4.0 06/03/2024   CL 99 06/03/2024   CO2 26 06/03/2024    Lab Results  Component Value Date   CHOL 255 (H) 03/14/2024   HDL 50 03/14/2024   LDLCALC 158 (H) 03/14/2024   TRIG 284 (H) 03/14/2024   CHOLHDL 5.1 (H) 03/14/2024    Current Outpatient Medications on File Prior to Visit  Medication Sig Dispense Refill   ALPRAZolam  (XANAX ) 1 MG tablet Take 1 tablet (1 mg total) by mouth daily as needed for anxiety. 30 tablet 0   amoxicillin -clavulanate (AUGMENTIN ) 875-125 MG tablet Take 1 tablet by mouth 2 (two) times daily. 20 tablet 0   Continuous Glucose Sensor (DEXCOM G7 SENSOR) MISC Use 1 sensor every 10 days to check glucose continuously as advised (Patient not taking: Reported on 05/29/2024) 9 each 3  metFORMIN  (GLUCOPHAGE ) 500 MG tablet Take 1 tablet (500 mg total) by mouth 2 (two) times daily with a meal. 60 tablet 2   TRESIBA  FLEXTOUCH 100 UNIT/ML FlexTouch Pen Inject 40 Units into the skin daily. (Patient not taking: Reported on 05/29/2024) 36 mL 1   No current facility-administered medications on file prior to visit.     Assessment/Plan:   - Based on reported income, patient meets criteria for Extra Help subsidy through Social Security Have encouraged patient to  complete this application online. Have sent MyChart message with details on how to apply for Extra Help online   - Patient requests message be sent to provider regarding request for refill of oxycodone  prescription  Will send message to PCP   Diabetes: - Currently uncontrolled - Contact [employer] [phone]) on behalf of patient today. Speak with representative Manolito who advises that previous CGM order was canceled as company was unable to connect with patient. However, today find that Jodeane still has a previous phone number on file for patient, despite Nurse Care Manager having previously contacted [employer] to provide updated contact information Again today provide correct contact phone number for patient. [employer] representative states that the company will attempt to reach patient - Collaborate with Nurse Care Manager to provide update. States that she will also try to connect with patient this week - Collaborate with PCP to request CGM sample for patient to continue home monitoring with CGM while waiting on supply from [facility].  Patient plans to pick up this sample from office today - Recommend to use Freestyle Libre CGM to monitor blood sugar/as feedback on dietary choices Recommend to check glucose with fingerstick check when needed for symptoms and as back up to CGM. Patient to contact office if needed for readings outside of established parameters or symptoms Patient plans to switch to Dexcom G7 CGM when able to receive this through DME     Follow Up Plan: Clinical Pharmacist will outreach to patient by telephone again next month   Sharyle Sia, PharmD, [facility] Clinical Pharmacist [facility] [phone]

## 2024-06-05 NOTE — Progress Notes (Signed)
 It appears that patient's previous order for CGM supplies with [employer] was canceled as company was unable to connect with patient.    Would you please again fax both a copy of latest office visit note and prescription for 3 month supply of the Dexcom G7 sensors to [facility] Attention: [facility] at [phone]?  Thank you!  Sharyle Sharyle Sia, PharmD, [facility] Clinical Pharmacist [facility] [phone]

## 2024-06-05 NOTE — Addendum Note (Signed)
 Addended by: EDMAN MARSA PARAS on: 06/05/2024 03:11 PM   Modules accepted: Orders

## 2024-06-08 DIAGNOSIS — E1140 Type 2 diabetes mellitus with diabetic neuropathy, unspecified: Secondary | ICD-10-CM | POA: Diagnosis not present

## 2024-06-08 DIAGNOSIS — Z556 Problems related to health literacy: Secondary | ICD-10-CM | POA: Diagnosis not present

## 2024-06-08 DIAGNOSIS — E11621 Type 2 diabetes mellitus with foot ulcer: Secondary | ICD-10-CM | POA: Diagnosis not present

## 2024-06-08 DIAGNOSIS — L97422 Non-pressure chronic ulcer of left heel and midfoot with fat layer exposed: Secondary | ICD-10-CM | POA: Diagnosis not present

## 2024-06-08 DIAGNOSIS — Z604 Social exclusion and rejection: Secondary | ICD-10-CM | POA: Diagnosis not present

## 2024-06-08 DIAGNOSIS — Z794 Long term (current) use of insulin: Secondary | ICD-10-CM | POA: Diagnosis not present

## 2024-06-08 DIAGNOSIS — Z9181 History of falling: Secondary | ICD-10-CM | POA: Diagnosis not present

## 2024-06-08 DIAGNOSIS — Z87820 Personal history of traumatic brain injury: Secondary | ICD-10-CM | POA: Diagnosis not present

## 2024-06-13 ENCOUNTER — Telehealth: Payer: Self-pay

## 2024-06-13 DIAGNOSIS — Z556 Problems related to health literacy: Secondary | ICD-10-CM | POA: Diagnosis not present

## 2024-06-13 DIAGNOSIS — E1140 Type 2 diabetes mellitus with diabetic neuropathy, unspecified: Secondary | ICD-10-CM | POA: Diagnosis not present

## 2024-06-13 DIAGNOSIS — Z794 Long term (current) use of insulin: Secondary | ICD-10-CM | POA: Diagnosis not present

## 2024-06-13 DIAGNOSIS — Z87820 Personal history of traumatic brain injury: Secondary | ICD-10-CM | POA: Diagnosis not present

## 2024-06-13 DIAGNOSIS — Z604 Social exclusion and rejection: Secondary | ICD-10-CM | POA: Diagnosis not present

## 2024-06-13 DIAGNOSIS — L97422 Non-pressure chronic ulcer of left heel and midfoot with fat layer exposed: Secondary | ICD-10-CM | POA: Diagnosis not present

## 2024-06-13 DIAGNOSIS — Z9181 History of falling: Secondary | ICD-10-CM | POA: Diagnosis not present

## 2024-06-13 DIAGNOSIS — E11621 Type 2 diabetes mellitus with foot ulcer: Secondary | ICD-10-CM | POA: Diagnosis not present

## 2024-06-13 NOTE — Telephone Encounter (Signed)
 Copied from CRM [id number]. Topic: Clinical - Home Health Verbal Orders >> Jun 13, 2024 10:11 AM Charlet HERO wrote: Caller/Agency: Christopher/ [facility] Callback Number: [phone] Service Requested: N/A Frequency: N/A Any new concerns about the patient? Yes [facility] [place] PT is calling to wanted to report blood sugar 400 yesterday prior to taking insulin , B/P 168/97 yesterday. States that he has frequent high blood pressure.

## 2024-06-13 NOTE — Telephone Encounter (Signed)
 Acknowledged. We are actively working on treating these chronic conditions. He was off meds for period of time and has recently restarted therapy as of 1-2 weeks ago.  Marsa Officer, DO [facility] 06/13/2024, 10:35 AM

## 2024-06-15 DIAGNOSIS — L97422 Non-pressure chronic ulcer of left heel and midfoot with fat layer exposed: Secondary | ICD-10-CM | POA: Diagnosis not present

## 2024-06-15 DIAGNOSIS — E1140 Type 2 diabetes mellitus with diabetic neuropathy, unspecified: Secondary | ICD-10-CM | POA: Diagnosis not present

## 2024-06-15 DIAGNOSIS — Z556 Problems related to health literacy: Secondary | ICD-10-CM | POA: Diagnosis not present

## 2024-06-15 DIAGNOSIS — Z9181 History of falling: Secondary | ICD-10-CM | POA: Diagnosis not present

## 2024-06-15 DIAGNOSIS — E11621 Type 2 diabetes mellitus with foot ulcer: Secondary | ICD-10-CM | POA: Diagnosis not present

## 2024-06-15 DIAGNOSIS — Z604 Social exclusion and rejection: Secondary | ICD-10-CM | POA: Diagnosis not present

## 2024-06-15 DIAGNOSIS — Z87820 Personal history of traumatic brain injury: Secondary | ICD-10-CM | POA: Diagnosis not present

## 2024-06-16 DIAGNOSIS — Z794 Long term (current) use of insulin: Secondary | ICD-10-CM | POA: Diagnosis not present

## 2024-06-16 DIAGNOSIS — E1140 Type 2 diabetes mellitus with diabetic neuropathy, unspecified: Secondary | ICD-10-CM | POA: Diagnosis not present

## 2024-06-16 DIAGNOSIS — E11621 Type 2 diabetes mellitus with foot ulcer: Secondary | ICD-10-CM | POA: Diagnosis not present

## 2024-06-16 DIAGNOSIS — Z9181 History of falling: Secondary | ICD-10-CM | POA: Diagnosis not present

## 2024-06-16 DIAGNOSIS — L97422 Non-pressure chronic ulcer of left heel and midfoot with fat layer exposed: Secondary | ICD-10-CM | POA: Diagnosis not present

## 2024-06-16 DIAGNOSIS — Z556 Problems related to health literacy: Secondary | ICD-10-CM | POA: Diagnosis not present

## 2024-06-16 DIAGNOSIS — Z604 Social exclusion and rejection: Secondary | ICD-10-CM | POA: Diagnosis not present

## 2024-06-16 DIAGNOSIS — Z87820 Personal history of traumatic brain injury: Secondary | ICD-10-CM | POA: Diagnosis not present

## 2024-06-19 DIAGNOSIS — Z87820 Personal history of traumatic brain injury: Secondary | ICD-10-CM | POA: Diagnosis not present

## 2024-06-21 DIAGNOSIS — E1169 Type 2 diabetes mellitus with other specified complication: Secondary | ICD-10-CM | POA: Diagnosis not present

## 2024-06-21 DIAGNOSIS — Z794 Long term (current) use of insulin: Secondary | ICD-10-CM | POA: Diagnosis not present

## 2024-06-22 ENCOUNTER — Telehealth: Payer: Self-pay

## 2024-06-22 NOTE — Telephone Encounter (Signed)
 Copied from CRM [id number]. Topic: Clinical - Home Health Verbal Orders >> Jun 22, 2024  4:04 PM Turkey B wrote: Caller/Agency: Bobbi from [facility] Callback Number: [phone]  Service Requested: eval for medical social worker Frequency: eval Any new concerns about the patient? no

## 2024-06-22 NOTE — Telephone Encounter (Signed)
 Okay to proceed w/ verbal orders  Marsa Officer, DO [facility] 06/22/2024, 4:49 PM

## 2024-06-23 NOTE — Telephone Encounter (Signed)
 No further action needed. Verbal give to [employer].

## 2024-06-26 ENCOUNTER — Ambulatory Visit: Admitting: Family Medicine

## 2024-06-29 DIAGNOSIS — Z794 Long term (current) use of insulin: Secondary | ICD-10-CM | POA: Diagnosis not present

## 2024-06-29 DIAGNOSIS — L97422 Non-pressure chronic ulcer of left heel and midfoot with fat layer exposed: Secondary | ICD-10-CM | POA: Diagnosis not present

## 2024-06-29 DIAGNOSIS — Z87820 Personal history of traumatic brain injury: Secondary | ICD-10-CM | POA: Diagnosis not present

## 2024-06-29 DIAGNOSIS — E11621 Type 2 diabetes mellitus with foot ulcer: Secondary | ICD-10-CM | POA: Diagnosis not present

## 2024-06-29 DIAGNOSIS — Z604 Social exclusion and rejection: Secondary | ICD-10-CM | POA: Diagnosis not present

## 2024-06-29 DIAGNOSIS — S90851A Superficial foreign body, right foot, initial encounter: Secondary | ICD-10-CM | POA: Diagnosis not present

## 2024-06-29 DIAGNOSIS — L97522 Non-pressure chronic ulcer of other part of left foot with fat layer exposed: Secondary | ICD-10-CM | POA: Diagnosis not present

## 2024-06-29 DIAGNOSIS — E1140 Type 2 diabetes mellitus with diabetic neuropathy, unspecified: Secondary | ICD-10-CM | POA: Diagnosis not present

## 2024-06-29 DIAGNOSIS — Z556 Problems related to health literacy: Secondary | ICD-10-CM | POA: Diagnosis not present

## 2024-06-29 DIAGNOSIS — Z9181 History of falling: Secondary | ICD-10-CM | POA: Diagnosis not present

## 2024-07-03 ENCOUNTER — Ambulatory Visit (INDEPENDENT_AMBULATORY_CARE_PROVIDER_SITE_OTHER): Admitting: Family Medicine

## 2024-07-03 ENCOUNTER — Ambulatory Visit: Admitting: Urology

## 2024-07-03 ENCOUNTER — Encounter: Payer: Self-pay | Admitting: Family Medicine

## 2024-07-03 VITALS — BP 130/80 | HR 105 | Ht 70.0 in | Wt 226.2 lb

## 2024-07-03 DIAGNOSIS — I10 Essential (primary) hypertension: Secondary | ICD-10-CM

## 2024-07-03 DIAGNOSIS — F419 Anxiety disorder, unspecified: Secondary | ICD-10-CM

## 2024-07-03 DIAGNOSIS — E11621 Type 2 diabetes mellitus with foot ulcer: Secondary | ICD-10-CM | POA: Diagnosis not present

## 2024-07-03 DIAGNOSIS — L97422 Non-pressure chronic ulcer of left heel and midfoot with fat layer exposed: Secondary | ICD-10-CM | POA: Diagnosis not present

## 2024-07-03 DIAGNOSIS — M25512 Pain in left shoulder: Secondary | ICD-10-CM

## 2024-07-03 DIAGNOSIS — Z87820 Personal history of traumatic brain injury: Secondary | ICD-10-CM

## 2024-07-03 DIAGNOSIS — E1169 Type 2 diabetes mellitus with other specified complication: Secondary | ICD-10-CM

## 2024-07-03 DIAGNOSIS — Z604 Social exclusion and rejection: Secondary | ICD-10-CM | POA: Diagnosis not present

## 2024-07-03 DIAGNOSIS — M153 Secondary multiple arthritis: Secondary | ICD-10-CM

## 2024-07-03 DIAGNOSIS — G8921 Chronic pain due to trauma: Secondary | ICD-10-CM | POA: Diagnosis not present

## 2024-07-03 DIAGNOSIS — M7542 Impingement syndrome of left shoulder: Secondary | ICD-10-CM

## 2024-07-03 DIAGNOSIS — E1140 Type 2 diabetes mellitus with diabetic neuropathy, unspecified: Secondary | ICD-10-CM | POA: Diagnosis not present

## 2024-07-03 DIAGNOSIS — Z794 Long term (current) use of insulin: Secondary | ICD-10-CM | POA: Diagnosis not present

## 2024-07-03 DIAGNOSIS — R531 Weakness: Secondary | ICD-10-CM

## 2024-07-03 DIAGNOSIS — E1142 Type 2 diabetes mellitus with diabetic polyneuropathy: Secondary | ICD-10-CM | POA: Diagnosis not present

## 2024-07-03 DIAGNOSIS — Z9181 History of falling: Secondary | ICD-10-CM | POA: Diagnosis not present

## 2024-07-03 DIAGNOSIS — L97525 Non-pressure chronic ulcer of other part of left foot with muscle involvement without evidence of necrosis: Secondary | ICD-10-CM | POA: Diagnosis not present

## 2024-07-03 DIAGNOSIS — Z556 Problems related to health literacy: Secondary | ICD-10-CM | POA: Diagnosis not present

## 2024-07-03 DIAGNOSIS — G8929 Other chronic pain: Secondary | ICD-10-CM

## 2024-07-03 LAB — POCT GLYCOSYLATED HEMOGLOBIN (HGB A1C): Hemoglobin A1C: 12.7 % — AB (ref 4.0–5.6)

## 2024-07-03 MED ORDER — NAPROXEN 500 MG PO TABS: 500.0000 mg | ORAL_TABLET | Freq: Two times a day (BID) | ORAL | 2 refills | Status: AC

## 2024-07-03 MED ORDER — INSULIN LISPRO (1 UNIT DIAL) 100 UNIT/ML (KWIKPEN): PEN_INJECTOR | SUBCUTANEOUS | 3 refills | Status: DC

## 2024-07-03 MED ORDER — ALPRAZOLAM 1 MG PO TABS: 1.0000 mg | ORAL_TABLET | Freq: Every day | ORAL | 0 refills | Status: DC | PRN

## 2024-07-03 MED ORDER — OXYCODONE HCL 5 MG PO TABS: 5.0000 mg | ORAL_TABLET | ORAL | 0 refills | Status: DC | PRN

## 2024-07-03 MED ORDER — METFORMIN HCL ER 750 MG PO TB24: 1500.0000 mg | ORAL_TABLET | Freq: Every day | ORAL | 1 refills | Status: DC

## 2024-07-03 NOTE — Patient Instructions (Addendum)
 Thank you for coming to the office today.  Dexcom G7 sample Tresiba  insulin  daily 40 units  Ordered Humalog   Refills Xanax , Pain medication  #1 call dr Neill to make sure they can send in the correct wound care orders  I will work with Dexcom G7 sensors to make sure these are ordered correctly.  [facility]) application will be completed this week so you can get help at home 1 x weekly as you suggested with variety of health topics  1 x weekly, heavy load washing, grocery shopping/carrying, sweeping, mopping, food prep for 1 week    Please schedule a Follow-up Appointment to: Return in about 3 months (around 10/02/2024), or 3 month DM A1c, for 3 month DM A1c.  If you have any other questions or concerns, please feel free to call the office or send a message through MyChart. You may also schedule an earlier appointment if necessary.  Additionally, you may be receiving a survey about your experience at our office within a few days to 1 week by e-mail or mail. We value your feedback.  Marsa Officer, DO [facility], [place]

## 2024-07-03 NOTE — Progress Notes (Signed)
 Subjective:    Patient ID: Shawn Rivas, male    DOB: [birth year], 58 y.o.   MRN: [id number]  Shawn Rivas is a 58 y.o. male presenting on 07/03/2024 for Medical Management of Chronic Issues   HPI  Discussed the use of AI scribe software for clinical note transcription with the patient, who gave verbal consent to proceed.  History of Present Illness   Shawn Rivas is a 58 year old male with diabetes and hypertension who presents with issues related to medication management and home care support.  Type 2 Diabetes, uncontrolled Glycemic control and diabetes management - Recent emergency room visit on August 23rd for hyperglycemia - A1c measured at 12.7 today prior 14% - Currently prescribed metformin  at a lower dose than previously taken, he was on XR 500 x 2 = 1000mg , ER gave him IR 500mg  x 2.  - Uses Dexcom G7 sensor for continuous glucose monitoring; lost one sensor during a foot procedure but he now has access to Dexcom G7 - Ready to resume insulin . Previously On Tresiba  40u for daily insulin  and Humalog  20u for mealtime insulin   Musculoskeletal pain and functional limitations - Ongoing left shoulder pain following a prior incident, he has history L Chronic Shoulder Pain with impingement of rotator cuff - Uses Tylenol for pain management - Prefers to address the underlying cause rather than mask pain - Physical limitations impact ability to manage household tasks  Chronic Foot injuries and wound care - Recent laceration to foot from glass, requiring removal of glass shards by a foot specialist - Unable to ambulate for one week following the injury - Reduction in wound care visits from twice weekly to once weekly without notification, resulting in complications with leg wound  Home safety and accessibility - Hazardous ramp at home has resulted in falls and difficulty accessing mailbox - Missed a recent appointment due to a fall on the ramp and inability to secure  home  He needs personal care services - Lives alone and has difficulty managing household tasks such as heavy cleaning, laundry, and meal preparation - Daughter is minimally involved in care  Traumatic Brain Injury History  - History of traumatic brain injury affecting daily functioning - Requires patience and understanding from those around him     Post Traumatic Osteoarthritis Chronic Pain Due to limitations with mobility due to joint pain He has walker and mobility equipment. For his joint pain, and limited mobility due to pain. He is out of oxycodone . Has taken it sparingly over past 3 months. requests a refill of oxycodone  for pain management.  - Has used Oxycodone  5mg  AS NEEDED only if pain. Takes about 2-3 x per week  Shoulder pain and upper extremity neuropathy - Severe shoulder pain impairing ability to ambulate with a walker - Use of heating pad on the shoulder provides some relief - Numbness in the hand associated with shoulder pain - Tylenol used for pain, providing temporary relief   Anxiety - Uses Xanax  as needed for anxiety       05/15/2024   11:46 AM 05/01/2024    4:19 PM 04/03/2024    4:08 PM  Depression screen PHQ 2/9  Decreased Interest 0 0 1  Down, Depressed, Hopeless 0 0 0  PHQ - 2 Score 0 0 1       04/03/2024    4:08 PM 03/21/2024    3:01 PM 03/14/2024    7:49 PM 01/29/2023    2:23 PM  GAD  7 : Generalized Anxiety Score  Nervous, Anxious, on Edge 1 3 3 2   Control/stop worrying 1 3 3 2   Worry too much - different things 1 3 3 2   Trouble relaxing 1 2 3 2   Restless 1 2 3  0  Easily annoyed or irritable 2 2 3 3   Afraid - awful might happen 1 3 3  0  Total GAD 7 Score 8 18 21 11   Anxiety Difficulty Very difficult Very difficult Very difficult Very difficult    Social History   Tobacco Use   Smoking status: Former    Current packs/day: 0.00    Average packs/day: 3.0 packs/day for 20.0 years (60.0 ttl pk-yrs)    Types: Cigarettes    Start date:  10/12/1990    Quit date: 10/12/2010    Years since quitting: 13.7   Smokeless tobacco: Never  Vaping Use   Vaping status: Never Used  Substance Use Topics   Alcohol use: Yes   Drug use: No    Review of Systems Per HPI unless specifically indicated above     Objective:    BP 130/80 (BP Location: Right Arm, Patient Position: Sitting, Cuff Size: Normal)   Pulse (!) 105   Ht 5' 10 (1.778 m)   Wt 226 lb 4 oz (102.6 kg)   SpO2 96%   BMI 32.46 kg/m   Wt Readings from Last 3 Encounters:  07/03/24 226 lb 4 oz (102.6 kg)  06/03/24 244 lb 6.4 oz (110.9 kg)  05/29/24 225 lb 3.2 oz (102.2 kg)    Physical Exam Vitals and nursing note reviewed.  Constitutional:      General: He is not in acute distress.    Appearance: He is well-developed. He is not diaphoretic.     Comments: Well-appearing, comfortable, cooperative  HENT:     Head: Normocephalic and atraumatic.     Ears:     Comments: No cerumen impaction. He has effusion bilateral L>R Eyes:     General:        Right eye: No discharge.        Left eye: No discharge.     Conjunctiva/sclera: Conjunctivae normal.     Pupils: Pupils are equal, round, and reactive to light.  Neck:     Thyroid : No thyromegaly.  Cardiovascular:     Rate and Rhythm: Normal rate and regular rhythm.     Pulses: Normal pulses.     Heart sounds: Normal heart sounds. No murmur heard. Pulmonary:     Effort: Pulmonary effort is normal. No respiratory distress.     Breath sounds: Normal breath sounds. No wheezing or rales.  Abdominal:     General: Bowel sounds are normal. There is no distension.     Palpations: Abdomen is soft. There is no mass.     Tenderness: There is no abdominal tenderness.  Musculoskeletal:        General: No tenderness. Normal range of motion.     Cervical back: Normal range of motion and neck supple.     Comments: Using walker  Limited mobility with range of motion low back lumbar spine and knees Notable muscle atrophy lower  extremities and upper extremities. Muscle strength is 4/5 bilateral upper and lower extremities  Lymphadenopathy:     Cervical: No cervical adenopathy.  Skin:    General: Skin is warm and dry.     Findings: No erythema or rash.  Neurological:     Mental Status: He is alert and oriented to person,  place, and time.     Comments: Distal sensation intact to light touch all extremities  Psychiatric:        Mood and Affect: Mood normal.        Behavior: Behavior normal.        Thought Content: Thought content normal.     Comments: Well groomed, good eye contact, normal speech and thoughts     Results for orders placed or performed in visit on 07/03/24  POCT HgB A1C   Collection Time: 07/03/24  3:16 PM  Result Value Ref Range   Hemoglobin A1C 12.7 (A) 4.0 - 5.6 %   HbA1c POC (<> result, manual entry)     HbA1c, POC (prediabetic range)     HbA1c, POC (controlled diabetic range)        Assessment & Plan:   Problem List Items Addressed This Visit     Chronic pain due to trauma   Relevant Medications   oxyCODONE  (OXY IR/ROXICODONE ) 5 MG immediate release tablet   naproxen  (NAPROSYN ) 500 MG tablet   Diabetic polyneuropathy associated with type 2 diabetes mellitus (HCC)   Relevant Medications   metFORMIN  (GLUCOPHAGE -XR) 750 MG 24 hr tablet   insulin  lispro (HUMALOG  KWIKPEN) 100 UNIT/ML KwikPen   ALPRAZolam  (XANAX ) 1 MG tablet   Essential hypertension   Long-term insulin  use (HCC)   Post-traumatic osteoarthritis   Relevant Medications   oxyCODONE  (OXY IR/ROXICODONE ) 5 MG immediate release tablet   naproxen  (NAPROSYN ) 500 MG tablet   Type 2 diabetes mellitus with other specified complication (HCC) - Primary   Relevant Medications   metFORMIN  (GLUCOPHAGE -XR) 750 MG 24 hr tablet   insulin  lispro (HUMALOG  KWIKPEN) 100 UNIT/ML KwikPen   Other Relevant Orders   POCT HgB A1C (Completed)   Other Visit Diagnoses       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, with muscle involvement without evidence of necrosis (HCC)       Relevant Medications   metFORMIN  (GLUCOPHAGE -XR) 750 MG 24 hr tablet   insulin  lispro (HUMALOG  KWIKPEN) 100 UNIT/ML KwikPen     Anxiety       Relevant Medications   ALPRAZolam  (XANAX ) 1 MG tablet     Chronic left shoulder pain       Relevant Medications   oxyCODONE  (OXY IR/ROXICODONE ) 5 MG immediate release tablet   naproxen  (NAPROSYN ) 500 MG tablet     Rotator cuff impingement syndrome, left       Relevant Medications   naproxen  (NAPROSYN ) 500 MG tablet     Generalized weakness         History of traumatic brain injury            Type 2 diabetes mellitus with poor glycemic control A1c at 12.7 indicates poor control but improved from 14% Current regimen suboptimal due to adherence issues Now he has DexcomG7 coverage/support and orders arriving, so he is agreeable to re-instating his insulin  regimen. 1 sample DexcomG7 given today for failed sensor  - Prescribed metformin  extended-release 750 mg, two tablets once daily  = 1500mg  daily instead of XR 500 x 2 = 1000mg  - Prescribed Humalog  20 units per meal, caution hypoglycemia - Provided Tresiba  samples for daily insulin . 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 - he should contact his [facility] Dr Neill to discuss wound care orders and ensure proper regimen is ordered to [facility] - Note he is not going to [facility], and I don't have active wound care home orders for him, it should be managed by Podiatry  Chronic left shoulder pain with impingement/bursitis Pain likely from impingement or bursitis, exacerbated by movement. - Limited other options other  than cortisone injection / orthopedic involvement in future - Prescribed naproxen  twice daily with meals for 2-4 weeks. 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 Desires joint replacement for stability. Future plans  Hypertension Controlled currently  Traumatic brain injury, chronic Chronic TBI affecting cognitive function and daily activities. Frustration with healthcare coordination.  Generalized anxiety disorder Anxiety exacerbated by health and social stressors. Uses Xanax  sparingly. - Refilled Xanax  prescription.  Chronic pain syndrome Chronic pain managed with oxycodone  and Xanax . Prefers addressing underlying causes. - Refilled oxycodone  prescription to allow him to remain mobile and functional to continue improving his health  He is enrolled in [facility] Case Management team and Home Health programs  Difficulty with activities of daily living Requires assistance with ADLs. Home environment poses safety risks due to high fall risk  - Order Personal Care Services [facility]) today, form completed, will be faxed to appropriate agency [facility] to initiate services, he has many benefits for home care including heavy load laundry x 1 week, household chores, carrying groceries, meal preparation for the week  - Ongoing issues include volunteer community support to build ramp for his home. He has had issues with communication unable to receive the company's calls and not able to do direct communication with them to proceed w/ scheduling and ramp building. He may need other agency. Felecia McCray [facility] CM RN is working on this already and trying to connect him with appropriate resource.  - Dexcom G7 supply - resolved    He reports issues with his Mailbox still dangerous location. I advised him that we have written letters but unfortunately this is not under our control.    Orders Placed This Encounter   Procedures   POCT HgB A1C    Meds ordered this encounter  Medications   metFORMIN  (GLUCOPHAGE -XR) 750 MG 24 hr tablet    Sig: Take 2 tablets (1,500 mg total) by mouth daily with breakfast.    Dispense:  180 tablet    Refill:  1   insulin  lispro (HUMALOG  KWIKPEN) 100 UNIT/ML KwikPen    Sig: Inject 20 units with meal.    Dispense:  15 mL    Refill:  3    For questions regarding this prescription please call [phone].   oxyCODONE  (OXY IR/ROXICODONE ) 5 MG immediate release tablet    Sig: Take 1 tablet (5 mg total) by mouth every 4 (four) hours as needed for severe pain (pain score 7-10).    Dispense:  30 tablet    Refill:  0   ALPRAZolam  (XANAX ) 1 MG tablet    Sig: Take 1 tablet (1 mg total) by mouth daily as needed for anxiety.    Dispense:  30 tablet    Refill:  0   naproxen  (NAPROSYN ) 500 MG tablet    Sig: Take 1 tablet (500 mg total) by mouth 2 (two) times daily with a meal. For 2-4 weeks then as needed    Dispense:  60 tablet    Refill:  2    Follow up plan: Return in about 3 months (around 10/02/2024), or 3 month DM A1c, for 3 month DM A1c.  Marsa Officer, DO [facility] 07/03/2024, 3:19 PM

## 2024-07-04 ENCOUNTER — Telehealth: Payer: Self-pay

## 2024-07-04 DIAGNOSIS — Z9181 History of falling: Secondary | ICD-10-CM | POA: Diagnosis not present

## 2024-07-04 DIAGNOSIS — Z87820 Personal history of traumatic brain injury: Secondary | ICD-10-CM | POA: Diagnosis not present

## 2024-07-04 DIAGNOSIS — Z556 Problems related to health literacy: Secondary | ICD-10-CM | POA: Diagnosis not present

## 2024-07-04 DIAGNOSIS — Z604 Social exclusion and rejection: Secondary | ICD-10-CM | POA: Diagnosis not present

## 2024-07-04 DIAGNOSIS — Z794 Long term (current) use of insulin: Secondary | ICD-10-CM | POA: Diagnosis not present

## 2024-07-04 DIAGNOSIS — L97422 Non-pressure chronic ulcer of left heel and midfoot with fat layer exposed: Secondary | ICD-10-CM | POA: Diagnosis not present

## 2024-07-04 DIAGNOSIS — E1140 Type 2 diabetes mellitus with diabetic neuropathy, unspecified: Secondary | ICD-10-CM | POA: Diagnosis not present

## 2024-07-04 DIAGNOSIS — E11621 Type 2 diabetes mellitus with foot ulcer: Secondary | ICD-10-CM | POA: Diagnosis not present

## 2024-07-05 NOTE — Progress Notes (Signed)
 Shawn Rivas                                          MRN: [id number]   07/05/2024   The [facility] Quality Team Specialist reviewed this patient medical record for the purposes of chart review for care gap closure. The following were reviewed: chart review for care gap closure-glycemic status assessment.    [facility] Quality Team

## 2024-07-06 ENCOUNTER — Telehealth: Payer: Self-pay

## 2024-07-06 DIAGNOSIS — Z9181 History of falling: Secondary | ICD-10-CM | POA: Diagnosis not present

## 2024-07-06 DIAGNOSIS — Z556 Problems related to health literacy: Secondary | ICD-10-CM | POA: Diagnosis not present

## 2024-07-06 DIAGNOSIS — E1140 Type 2 diabetes mellitus with diabetic neuropathy, unspecified: Secondary | ICD-10-CM | POA: Diagnosis not present

## 2024-07-06 DIAGNOSIS — E11621 Type 2 diabetes mellitus with foot ulcer: Secondary | ICD-10-CM | POA: Diagnosis not present

## 2024-07-06 DIAGNOSIS — Z794 Long term (current) use of insulin: Secondary | ICD-10-CM | POA: Diagnosis not present

## 2024-07-06 DIAGNOSIS — Z604 Social exclusion and rejection: Secondary | ICD-10-CM | POA: Diagnosis not present

## 2024-07-06 DIAGNOSIS — L97422 Non-pressure chronic ulcer of left heel and midfoot with fat layer exposed: Secondary | ICD-10-CM | POA: Diagnosis not present

## 2024-07-06 DIAGNOSIS — Z87820 Personal history of traumatic brain injury: Secondary | ICD-10-CM | POA: Diagnosis not present

## 2024-07-06 NOTE — Patient Instructions (Signed)
 Wentworth S Santelli - I am sorry I was unable to reach you today for our scheduled appointment. I work with Edman, Marsa PARAS, DO and am calling to support your healthcare needs. Please contact me at [phone] at your earliest convenience. I look forward to speaking with you soon.   Thank you,   Jackson Acron [address] [facility] RN Care Manager Direct Dial : [phone]  Fax: [phone] Website: [email or web address]

## 2024-07-07 ENCOUNTER — Other Ambulatory Visit (HOSPITAL_COMMUNITY): Payer: Self-pay

## 2024-07-10 DIAGNOSIS — Z9181 History of falling: Secondary | ICD-10-CM | POA: Diagnosis not present

## 2024-07-10 DIAGNOSIS — Z556 Problems related to health literacy: Secondary | ICD-10-CM | POA: Diagnosis not present

## 2024-07-10 DIAGNOSIS — E1140 Type 2 diabetes mellitus with diabetic neuropathy, unspecified: Secondary | ICD-10-CM | POA: Diagnosis not present

## 2024-07-10 DIAGNOSIS — Z604 Social exclusion and rejection: Secondary | ICD-10-CM | POA: Diagnosis not present

## 2024-07-10 DIAGNOSIS — Z794 Long term (current) use of insulin: Secondary | ICD-10-CM | POA: Diagnosis not present

## 2024-07-10 DIAGNOSIS — Z87820 Personal history of traumatic brain injury: Secondary | ICD-10-CM | POA: Diagnosis not present

## 2024-07-10 DIAGNOSIS — L97422 Non-pressure chronic ulcer of left heel and midfoot with fat layer exposed: Secondary | ICD-10-CM | POA: Diagnosis not present

## 2024-07-10 DIAGNOSIS — E11621 Type 2 diabetes mellitus with foot ulcer: Secondary | ICD-10-CM | POA: Diagnosis not present

## 2024-07-11 DIAGNOSIS — E11621 Type 2 diabetes mellitus with foot ulcer: Secondary | ICD-10-CM | POA: Diagnosis not present

## 2024-07-11 DIAGNOSIS — Z9181 History of falling: Secondary | ICD-10-CM | POA: Diagnosis not present

## 2024-07-11 DIAGNOSIS — Z87820 Personal history of traumatic brain injury: Secondary | ICD-10-CM | POA: Diagnosis not present

## 2024-07-11 DIAGNOSIS — Z794 Long term (current) use of insulin: Secondary | ICD-10-CM | POA: Diagnosis not present

## 2024-07-11 DIAGNOSIS — E1140 Type 2 diabetes mellitus with diabetic neuropathy, unspecified: Secondary | ICD-10-CM | POA: Diagnosis not present

## 2024-07-11 DIAGNOSIS — Z556 Problems related to health literacy: Secondary | ICD-10-CM | POA: Diagnosis not present

## 2024-07-11 DIAGNOSIS — Z604 Social exclusion and rejection: Secondary | ICD-10-CM | POA: Diagnosis not present

## 2024-07-11 DIAGNOSIS — L97422 Non-pressure chronic ulcer of left heel and midfoot with fat layer exposed: Secondary | ICD-10-CM | POA: Diagnosis not present

## 2024-07-13 DIAGNOSIS — Z9181 History of falling: Secondary | ICD-10-CM | POA: Diagnosis not present

## 2024-07-13 DIAGNOSIS — E1140 Type 2 diabetes mellitus with diabetic neuropathy, unspecified: Secondary | ICD-10-CM | POA: Diagnosis not present

## 2024-07-13 DIAGNOSIS — E11621 Type 2 diabetes mellitus with foot ulcer: Secondary | ICD-10-CM | POA: Diagnosis not present

## 2024-07-13 DIAGNOSIS — Z87820 Personal history of traumatic brain injury: Secondary | ICD-10-CM | POA: Diagnosis not present

## 2024-07-13 DIAGNOSIS — Z556 Problems related to health literacy: Secondary | ICD-10-CM | POA: Diagnosis not present

## 2024-07-13 DIAGNOSIS — L97422 Non-pressure chronic ulcer of left heel and midfoot with fat layer exposed: Secondary | ICD-10-CM | POA: Diagnosis not present

## 2024-07-13 DIAGNOSIS — Z604 Social exclusion and rejection: Secondary | ICD-10-CM | POA: Diagnosis not present

## 2024-07-13 DIAGNOSIS — Z794 Long term (current) use of insulin: Secondary | ICD-10-CM | POA: Diagnosis not present

## 2024-07-17 DIAGNOSIS — Z87820 Personal history of traumatic brain injury: Secondary | ICD-10-CM | POA: Diagnosis not present

## 2024-07-17 DIAGNOSIS — E1140 Type 2 diabetes mellitus with diabetic neuropathy, unspecified: Secondary | ICD-10-CM | POA: Diagnosis not present

## 2024-07-17 DIAGNOSIS — Z604 Social exclusion and rejection: Secondary | ICD-10-CM | POA: Diagnosis not present

## 2024-07-17 DIAGNOSIS — Z9181 History of falling: Secondary | ICD-10-CM | POA: Diagnosis not present

## 2024-07-17 DIAGNOSIS — Z794 Long term (current) use of insulin: Secondary | ICD-10-CM | POA: Diagnosis not present

## 2024-07-17 DIAGNOSIS — L97422 Non-pressure chronic ulcer of left heel and midfoot with fat layer exposed: Secondary | ICD-10-CM | POA: Diagnosis not present

## 2024-07-17 DIAGNOSIS — E11621 Type 2 diabetes mellitus with foot ulcer: Secondary | ICD-10-CM | POA: Diagnosis not present

## 2024-07-17 DIAGNOSIS — Z556 Problems related to health literacy: Secondary | ICD-10-CM | POA: Diagnosis not present

## 2024-07-20 DIAGNOSIS — Z556 Problems related to health literacy: Secondary | ICD-10-CM | POA: Diagnosis not present

## 2024-07-20 DIAGNOSIS — L97422 Non-pressure chronic ulcer of left heel and midfoot with fat layer exposed: Secondary | ICD-10-CM | POA: Diagnosis not present

## 2024-07-20 DIAGNOSIS — Z9181 History of falling: Secondary | ICD-10-CM | POA: Diagnosis not present

## 2024-07-20 DIAGNOSIS — Z794 Long term (current) use of insulin: Secondary | ICD-10-CM | POA: Diagnosis not present

## 2024-07-20 DIAGNOSIS — Z87820 Personal history of traumatic brain injury: Secondary | ICD-10-CM | POA: Diagnosis not present

## 2024-07-20 DIAGNOSIS — Z604 Social exclusion and rejection: Secondary | ICD-10-CM | POA: Diagnosis not present

## 2024-07-20 DIAGNOSIS — E11621 Type 2 diabetes mellitus with foot ulcer: Secondary | ICD-10-CM | POA: Diagnosis not present

## 2024-07-20 DIAGNOSIS — E1140 Type 2 diabetes mellitus with diabetic neuropathy, unspecified: Secondary | ICD-10-CM | POA: Diagnosis not present

## 2024-07-24 ENCOUNTER — Telehealth: Payer: Self-pay

## 2024-07-24 DIAGNOSIS — E1169 Type 2 diabetes mellitus with other specified complication: Secondary | ICD-10-CM

## 2024-07-24 DIAGNOSIS — G8921 Chronic pain due to trauma: Secondary | ICD-10-CM

## 2024-07-24 DIAGNOSIS — I872 Venous insufficiency (chronic) (peripheral): Secondary | ICD-10-CM

## 2024-07-26 NOTE — Progress Notes (Signed)
 Shawn Rivas                                          MRN: [id number]   07/26/2024   The [facility] Quality Team Specialist reviewed this patient medical record for the purposes of chart review for care gap closure. The following were reviewed: chart review for care gap closure-colorectal cancer screening.    [facility] Quality Team

## 2024-07-27 DIAGNOSIS — Z794 Long term (current) use of insulin: Secondary | ICD-10-CM | POA: Diagnosis not present

## 2024-07-27 DIAGNOSIS — E1140 Type 2 diabetes mellitus with diabetic neuropathy, unspecified: Secondary | ICD-10-CM | POA: Diagnosis not present

## 2024-07-27 DIAGNOSIS — L97522 Non-pressure chronic ulcer of other part of left foot with fat layer exposed: Secondary | ICD-10-CM | POA: Diagnosis not present

## 2024-07-28 ENCOUNTER — Encounter: Payer: Self-pay | Admitting: Family Medicine

## 2024-07-28 ENCOUNTER — Telehealth: Payer: Self-pay

## 2024-07-28 DIAGNOSIS — G8921 Chronic pain due to trauma: Secondary | ICD-10-CM

## 2024-07-28 MED ORDER — OXYCODONE HCL 5 MG PO TABS: 5.0000 mg | ORAL_TABLET | ORAL | 0 refills | Status: DC | PRN

## 2024-07-28 NOTE — Progress Notes (Signed)
   Telephone encounter was:  Unsuccessful.  07/28/2024 Name: Shawn Rivas MRN: [id number] DOB: 10-27-1965  Unsuccessful outbound call made today to assist with:  Home Modifications  Outreach Attempt:  1st Attempt  No answer and unable to leave a message    Jon Colt [facility]  [facility] Guide, Phone: [phone] Fax: [phone] Website: [email or web address]

## 2024-08-01 ENCOUNTER — Telehealth: Payer: Self-pay

## 2024-08-01 NOTE — Progress Notes (Signed)
   Telephone encounter was:  Unsuccessful.  08/01/2024 Name: CAELAN BRANDEN MRN: [id number] DOB: Mar 31, 1966  Unsuccessful outbound call made today to assist with:  Home Modifications  Outreach Attempt:  2nd Attempt  No answer and unable to leave a message    Jon Colt [facility]  [facility] Guide, Phone: [phone] Fax: [phone] Website: [email or web address]

## 2024-08-03 ENCOUNTER — Telehealth: Payer: Self-pay

## 2024-08-03 NOTE — Progress Notes (Signed)
   Telephone encounter was:  Unsuccessful.  08/03/2024 Name: Shawn Rivas MRN: [id number] DOB: 02/13/1966  Unsuccessful outbound call made today to assist with:  Home Modifications  Outreach Attempt:  3rd Attempt.  Referral closed unable to contact patient.  A HIPAA compliant voice message was left requesting a return call.  Instructed patient to call back    Jon Colt [facility] Guide, Phone: [phone] Fax: [phone] Website: [email or web address]

## 2024-08-08 NOTE — Patient Outreach (Signed)
  08/08/2024 Name: BRANDUN PINN MRN: [id number] DOB: 1965/11/17   Notification received. Care team has made multiple attempts to reach Mr. Torre. Call attempts have been unsuccessful. Complex case will be closed. The care team will gladly assist if Mr. Knies requires assistance and agrees to outreach.  PLAN Will complete case closure Will update Primary Care Provider   Jackson Karoline [facility] Management [phone]

## 2024-08-14 ENCOUNTER — Other Ambulatory Visit: Admitting: Urology

## 2024-08-14 DIAGNOSIS — R32 Unspecified urinary incontinence: Secondary | ICD-10-CM

## 2024-08-15 ENCOUNTER — Encounter: Payer: Self-pay | Admitting: Urology

## 2024-08-22 MED ORDER — OXYCODONE HCL 5 MG PO TABS: 5.0000 mg | ORAL_TABLET | ORAL | 0 refills | Status: DC | PRN

## 2024-08-22 NOTE — Addendum Note (Signed)
 Addended by: EDMAN MARSA PARAS on: 08/22/2024 02:33 PM   Modules accepted: Orders

## 2024-08-29 ENCOUNTER — Encounter: Attending: Physician Assistant | Admitting: Physician Assistant

## 2024-08-29 ENCOUNTER — Other Ambulatory Visit: Payer: Self-pay | Admitting: Physician Assistant

## 2024-08-29 ENCOUNTER — Ambulatory Visit
Admission: RE | Admit: 2024-08-29 | Discharge: 2024-08-29 | Disposition: A | Discharge status: Home / Self Care | Attending: Physician Assistant | Admitting: Physician Assistant

## 2024-08-29 ENCOUNTER — Ambulatory Visit
Admission: RE | Admit: 2024-08-29 | Discharge: 2024-08-29 | Disposition: A | Discharge status: Home / Self Care | Source: Ambulatory Visit | Attending: Physician Assistant | Admitting: Physician Assistant

## 2024-08-29 DIAGNOSIS — I890 Lymphedema, not elsewhere classified: Secondary | ICD-10-CM | POA: Insufficient documentation

## 2024-08-29 DIAGNOSIS — M86172 Other acute osteomyelitis, left ankle and foot: Secondary | ICD-10-CM | POA: Insufficient documentation

## 2024-08-29 DIAGNOSIS — E1143 Type 2 diabetes mellitus with diabetic autonomic (poly)neuropathy: Secondary | ICD-10-CM | POA: Insufficient documentation

## 2024-08-29 DIAGNOSIS — L97322 Non-pressure chronic ulcer of left ankle with fat layer exposed: Secondary | ICD-10-CM | POA: Insufficient documentation

## 2024-08-29 DIAGNOSIS — E11621 Type 2 diabetes mellitus with foot ulcer: Secondary | ICD-10-CM | POA: Insufficient documentation

## 2024-08-29 DIAGNOSIS — Z87820 Personal history of traumatic brain injury: Secondary | ICD-10-CM | POA: Insufficient documentation

## 2024-08-29 DIAGNOSIS — L97522 Non-pressure chronic ulcer of other part of left foot with fat layer exposed: Secondary | ICD-10-CM | POA: Insufficient documentation

## 2024-09-01 ENCOUNTER — Telehealth: Payer: Self-pay

## 2024-09-01 NOTE — Telephone Encounter (Signed)
 Spoke with patient notified we will have to wait until Dr Edman is back in the office.

## 2024-09-01 NOTE — Telephone Encounter (Signed)
 Reviewed Dr. Edman last note.  Reports that he was taking oxycodone  2-3 times a week.  I think he is too soon for refill at this time.  Will need to wait until PCPs return.

## 2024-09-01 NOTE — Telephone Encounter (Signed)
 Copied from CRM #[id number]. Topic: Clinical - Prescription Issue >> Sep 01, 2024  1:39 PM Anairis L wrote: Reason for CRM: Patient needs oxyCODONE  (OXY IR/ROXICODONE ) 5 MG immediate release tablet Refill for pain, he is going today to the pharmacy fro his antibiotics and would like to see if another provider can approve medication since Dr. Edman is out today.

## 2024-09-01 NOTE — Telephone Encounter (Signed)
 In middle of the call the patient was recording the call with out my knowledge. I ended the call promptly. He was instancing that Angeline Laura has a problem with him and his family and that is why she would not refill his medication. This is after myself explained to him the directions of his prescription and we would have to wait until Dr Edman is back in the office on Monday.

## 2024-09-04 ENCOUNTER — Encounter: Payer: Self-pay | Admitting: Family Medicine

## 2024-09-04 ENCOUNTER — Encounter: Admitting: Physician Assistant

## 2024-09-04 DIAGNOSIS — E11621 Type 2 diabetes mellitus with foot ulcer: Secondary | ICD-10-CM

## 2024-09-04 DIAGNOSIS — G8921 Chronic pain due to trauma: Secondary | ICD-10-CM

## 2024-09-05 MED ORDER — OXYCODONE HCL 5 MG PO TABS: 5.0000 mg | ORAL_TABLET | ORAL | 0 refills | Status: DC | PRN

## 2024-09-05 NOTE — Addendum Note (Signed)
 Addended by: EDMAN MARSA PARAS on: 09/05/2024 12:55 PM   Modules accepted: Orders

## 2024-09-06 ENCOUNTER — Encounter: Payer: Self-pay | Admitting: Family Medicine

## 2024-09-06 DIAGNOSIS — I10 Essential (primary) hypertension: Secondary | ICD-10-CM | POA: Diagnosis not present

## 2024-09-06 NOTE — Progress Notes (Signed)
 Shawn Rivas                                          MRN: [id number]   09/06/2024   The [facility] Quality Team Specialist reviewed this patient medical record for the purposes of chart review for care gap closure. The following were reviewed: chart review for care gap closure-kidney health evaluation for diabetes:eGFR  and uACR.    [facility] Quality Team

## 2024-09-06 NOTE — Progress Notes (Signed)
 Shawn Rivas                                          MRN: [id number]   09/06/2024   The [facility] Quality Team Specialist reviewed this patient medical record for the purposes of chart review for care gap closure. The following were reviewed: chart review for care gap closure-glycemic status assessment.    [facility] Quality Team

## 2024-09-11 ENCOUNTER — Encounter: Attending: Physician Assistant | Admitting: Physician Assistant

## 2024-09-11 DIAGNOSIS — E11622 Type 2 diabetes mellitus with other skin ulcer: Secondary | ICD-10-CM | POA: Diagnosis not present

## 2024-09-11 DIAGNOSIS — E11621 Type 2 diabetes mellitus with foot ulcer: Secondary | ICD-10-CM | POA: Insufficient documentation

## 2024-09-11 DIAGNOSIS — L97322 Non-pressure chronic ulcer of left ankle with fat layer exposed: Secondary | ICD-10-CM | POA: Insufficient documentation

## 2024-09-11 DIAGNOSIS — L97522 Non-pressure chronic ulcer of other part of left foot with fat layer exposed: Secondary | ICD-10-CM | POA: Diagnosis not present

## 2024-09-11 DIAGNOSIS — Z87820 Personal history of traumatic brain injury: Secondary | ICD-10-CM | POA: Diagnosis not present

## 2024-09-11 DIAGNOSIS — E1143 Type 2 diabetes mellitus with diabetic autonomic (poly)neuropathy: Secondary | ICD-10-CM | POA: Diagnosis not present

## 2024-09-11 DIAGNOSIS — I890 Lymphedema, not elsewhere classified: Secondary | ICD-10-CM | POA: Insufficient documentation

## 2024-09-11 MED ORDER — VALSARTAN 160 MG PO TABS: 160.0000 mg | ORAL_TABLET | Freq: Every day | ORAL | 2 refills | Status: DC

## 2024-09-11 NOTE — Telephone Encounter (Signed)
Please see the MyChart message reply(ies) for my assessment and plan.    This patient gave consent for this Medical Advice Message and is aware that it may result in a bill to their insurance company, as well as the possibility of receiving a bill for a co-payment or deductible. They are an established patient, but are not seeking medical advice exclusively about a problem treated during an in person or video visit in the last seven days. I did not recommend an in person or video visit within seven days of my reply.    I spent a total of 10 minutes cumulative time within 7 days through MyChart messaging.  Hosea Hanawalt, DO   

## 2024-09-19 ENCOUNTER — Encounter: Payer: Self-pay | Admitting: Internal Medicine

## 2024-09-19 ENCOUNTER — Emergency Department

## 2024-09-19 ENCOUNTER — Other Ambulatory Visit: Payer: Self-pay

## 2024-09-19 ENCOUNTER — Inpatient Hospital Stay
Admission: EM | Admit: 2024-09-19 | Discharge: 2024-10-01 | DRG: 536 | Disposition: A | Discharge status: Skilled Nursing Facility | Attending: Internal Medicine | Admitting: Internal Medicine

## 2024-09-19 DIAGNOSIS — L97429 Non-pressure chronic ulcer of left heel and midfoot with unspecified severity: Secondary | ICD-10-CM | POA: Diagnosis present

## 2024-09-19 DIAGNOSIS — M978XXD Periprosthetic fracture around other internal prosthetic joint, subsequent encounter: Secondary | ICD-10-CM | POA: Diagnosis not present

## 2024-09-19 DIAGNOSIS — E7800 Pure hypercholesterolemia, unspecified: Secondary | ICD-10-CM | POA: Diagnosis present

## 2024-09-19 DIAGNOSIS — I10 Essential (primary) hypertension: Secondary | ICD-10-CM | POA: Diagnosis present

## 2024-09-19 DIAGNOSIS — E1151 Type 2 diabetes mellitus with diabetic peripheral angiopathy without gangrene: Secondary | ICD-10-CM | POA: Diagnosis present

## 2024-09-19 DIAGNOSIS — Z794 Long term (current) use of insulin: Secondary | ICD-10-CM | POA: Diagnosis not present

## 2024-09-19 DIAGNOSIS — E1165 Type 2 diabetes mellitus with hyperglycemia: Secondary | ICD-10-CM | POA: Diagnosis present

## 2024-09-19 DIAGNOSIS — Z91148 Patient's other noncompliance with medication regimen for other reason: Secondary | ICD-10-CM

## 2024-09-19 DIAGNOSIS — Z96659 Presence of unspecified artificial knee joint: Secondary | ICD-10-CM | POA: Diagnosis present

## 2024-09-19 DIAGNOSIS — Z981 Arthrodesis status: Secondary | ICD-10-CM

## 2024-09-19 DIAGNOSIS — E11621 Type 2 diabetes mellitus with foot ulcer: Secondary | ICD-10-CM | POA: Diagnosis present

## 2024-09-19 DIAGNOSIS — Z96649 Presence of unspecified artificial hip joint: Secondary | ICD-10-CM

## 2024-09-19 DIAGNOSIS — N182 Chronic kidney disease, stage 2 (mild): Secondary | ICD-10-CM | POA: Diagnosis present

## 2024-09-19 DIAGNOSIS — E872 Acidosis, unspecified: Secondary | ICD-10-CM | POA: Diagnosis present

## 2024-09-19 DIAGNOSIS — N179 Acute kidney failure, unspecified: Secondary | ICD-10-CM | POA: Diagnosis present

## 2024-09-19 DIAGNOSIS — W19XXXA Unspecified fall, initial encounter: Secondary | ICD-10-CM

## 2024-09-19 DIAGNOSIS — I129 Hypertensive chronic kidney disease with stage 1 through stage 4 chronic kidney disease, or unspecified chronic kidney disease: Secondary | ICD-10-CM | POA: Diagnosis present

## 2024-09-19 DIAGNOSIS — E1169 Type 2 diabetes mellitus with other specified complication: Secondary | ICD-10-CM | POA: Diagnosis present

## 2024-09-19 DIAGNOSIS — E7849 Other hyperlipidemia: Secondary | ICD-10-CM | POA: Diagnosis present

## 2024-09-19 DIAGNOSIS — E1122 Type 2 diabetes mellitus with diabetic chronic kidney disease: Secondary | ICD-10-CM | POA: Diagnosis present

## 2024-09-19 DIAGNOSIS — Z7984 Long term (current) use of oral hypoglycemic drugs: Secondary | ICD-10-CM | POA: Diagnosis not present

## 2024-09-19 DIAGNOSIS — Z87820 Personal history of traumatic brain injury: Secondary | ICD-10-CM | POA: Diagnosis not present

## 2024-09-19 DIAGNOSIS — W07XXXA Fall from chair, initial encounter: Secondary | ICD-10-CM | POA: Diagnosis present

## 2024-09-19 DIAGNOSIS — S72102A Unspecified trochanteric fracture of left femur, initial encounter for closed fracture: Principal | ICD-10-CM

## 2024-09-19 DIAGNOSIS — T383X6A Underdosing of insulin and oral hypoglycemic [antidiabetic] drugs, initial encounter: Secondary | ICD-10-CM | POA: Diagnosis present

## 2024-09-19 DIAGNOSIS — Z884 Allergy status to anesthetic agent status: Secondary | ICD-10-CM

## 2024-09-19 DIAGNOSIS — L97329 Non-pressure chronic ulcer of left ankle with unspecified severity: Secondary | ICD-10-CM | POA: Diagnosis present

## 2024-09-19 DIAGNOSIS — S72112A Displaced fracture of greater trochanter of left femur, initial encounter for closed fracture: Secondary | ICD-10-CM | POA: Diagnosis present

## 2024-09-19 DIAGNOSIS — Z885 Allergy status to narcotic agent status: Secondary | ICD-10-CM

## 2024-09-19 DIAGNOSIS — Z602 Problems related to living alone: Secondary | ICD-10-CM | POA: Diagnosis present

## 2024-09-19 DIAGNOSIS — Z91128 Patient's intentional underdosing of medication regimen for other reason: Secondary | ICD-10-CM

## 2024-09-19 DIAGNOSIS — Z87891 Personal history of nicotine dependence: Secondary | ICD-10-CM

## 2024-09-19 DIAGNOSIS — E871 Hypo-osmolality and hyponatremia: Secondary | ICD-10-CM | POA: Diagnosis present

## 2024-09-19 DIAGNOSIS — I2510 Atherosclerotic heart disease of native coronary artery without angina pectoris: Secondary | ICD-10-CM | POA: Diagnosis present

## 2024-09-19 DIAGNOSIS — I878 Other specified disorders of veins: Secondary | ICD-10-CM | POA: Diagnosis present

## 2024-09-19 DIAGNOSIS — E113212 Type 2 diabetes mellitus with mild nonproliferative diabetic retinopathy with macular edema, left eye: Principal | ICD-10-CM

## 2024-09-19 DIAGNOSIS — I739 Peripheral vascular disease, unspecified: Secondary | ICD-10-CM | POA: Diagnosis present

## 2024-09-19 DIAGNOSIS — F419 Anxiety disorder, unspecified: Secondary | ICD-10-CM

## 2024-09-19 DIAGNOSIS — M9702XA Periprosthetic fracture around internal prosthetic left hip joint, initial encounter: Secondary | ICD-10-CM | POA: Diagnosis present

## 2024-09-19 DIAGNOSIS — Z8249 Family history of ischemic heart disease and other diseases of the circulatory system: Secondary | ICD-10-CM

## 2024-09-19 DIAGNOSIS — M978XXA Periprosthetic fracture around other internal prosthetic joint, initial encounter: Secondary | ICD-10-CM | POA: Diagnosis not present

## 2024-09-19 DIAGNOSIS — E1142 Type 2 diabetes mellitus with diabetic polyneuropathy: Secondary | ICD-10-CM | POA: Diagnosis present

## 2024-09-19 DIAGNOSIS — R809 Proteinuria, unspecified: Secondary | ICD-10-CM | POA: Diagnosis present

## 2024-09-19 DIAGNOSIS — E1140 Type 2 diabetes mellitus with diabetic neuropathy, unspecified: Secondary | ICD-10-CM | POA: Diagnosis present

## 2024-09-19 DIAGNOSIS — G8921 Chronic pain due to trauma: Secondary | ICD-10-CM

## 2024-09-19 LAB — CBC WITH DIFFERENTIAL/PLATELET
Abs Immature Granulocytes: 0.06 K/uL (ref 0.00–0.07)
Basophils Absolute: 0.1 K/uL (ref 0.0–0.1)
Basophils Relative: 1 %
Eosinophils Absolute: 0.1 K/uL (ref 0.0–0.5)
Eosinophils Relative: 1 %
HCT: 43.4 % (ref 39.0–52.0)
Hemoglobin: 15.4 g/dL (ref 13.0–17.0)
Immature Granulocytes: 1 %
Lymphocytes Relative: 13 %
Lymphs Abs: 1.6 K/uL (ref 0.7–4.0)
MCH: 30.4 pg (ref 26.0–34.0)
MCHC: 35.5 g/dL (ref 30.0–36.0)
MCV: 85.6 fL (ref 80.0–100.0)
Monocytes Absolute: 0.8 K/uL (ref 0.1–1.0)
Monocytes Relative: 6 %
Neutro Abs: 9.4 K/uL — ABNORMAL HIGH (ref 1.7–7.7)
Neutrophils Relative %: 78 %
Platelets: 348 K/uL (ref 150–400)
RBC: 5.07 MIL/uL (ref 4.22–5.81)
RDW: 12.6 % (ref 11.5–15.5)
WBC: 12 K/uL — ABNORMAL HIGH (ref 4.0–10.5)
nRBC: 0 % (ref 0.0–0.2)

## 2024-09-19 LAB — BASIC METABOLIC PANEL WITH GFR
Anion gap: 13 (ref 5–15)
BUN: 27 mg/dL — ABNORMAL HIGH (ref 6–20)
CO2: 20 mmol/L — ABNORMAL LOW (ref 22–32)
Calcium: 9.5 mg/dL (ref 8.9–10.3)
Chloride: 100 mmol/L (ref 98–111)
Creatinine, Ser: 1.12 mg/dL (ref 0.61–1.24)
GFR, Estimated: >60 mL/min (ref >60)
Glucose, Bld: 366 mg/dL — ABNORMAL HIGH (ref 70–99)
Potassium: 4.1 mmol/L (ref 3.5–5.1)
Sodium: 133 mmol/L — ABNORMAL LOW (ref 135–145)

## 2024-09-19 LAB — MAGNESIUM: Magnesium: 1.9 mg/dL (ref 1.7–2.4)

## 2024-09-19 LAB — GLUCOSE, CAPILLARY: Glucose-Capillary: 288 mg/dL — ABNORMAL HIGH (ref 70–99)

## 2024-09-19 MED ORDER — OXYCODONE HCL 5 MG PO TABS
5.0000 mg | ORAL_TABLET | Freq: Once | ORAL | Status: AC
  Administered 2024-09-19: 5 mg via ORAL
  Filled 2024-09-19: qty 1

## 2024-09-19 MED ORDER — MELATONIN 5 MG PO TABS
2.5000 mg | ORAL_TABLET | Freq: Every day | ORAL | Status: DC
  Administered 2024-09-19 – 2024-09-24 (×5): 2.5 mg via ORAL
  Filled 2024-09-19 (×7): qty 1

## 2024-09-19 MED ORDER — FENTANYL CITRATE (PF) 50 MCG/ML IJ SOSY
50.0000 ug | PREFILLED_SYRINGE | Freq: Once | INTRAMUSCULAR | Status: AC
  Administered 2024-09-19: 50 ug via INTRAVENOUS
  Filled 2024-09-19: qty 1

## 2024-09-19 MED ORDER — FENTANYL CITRATE (PF) 50 MCG/ML IJ SOSY
PREFILLED_SYRINGE | INTRAMUSCULAR | Status: DC
  Filled 2024-09-19: qty 1

## 2024-09-19 MED ORDER — MORPHINE SULFATE (PF) 2 MG/ML IV SOLN
2.0000 mg | INTRAVENOUS | Status: DC | PRN
  Administered 2024-09-19 – 2024-09-24 (×4): 2 mg via INTRAVENOUS
  Filled 2024-09-19 (×5): qty 1

## 2024-09-19 MED ORDER — SODIUM CHLORIDE 0.9% FLUSH
3.0000 mL | Freq: Two times a day (BID) | INTRAVENOUS | Status: DC
  Administered 2024-09-19 – 2024-09-30 (×19): 3 mL via INTRAVENOUS

## 2024-09-19 MED ORDER — HEPARIN SODIUM (PORCINE) 5000 UNIT/ML IJ SOLN
5000.0000 [IU] | Freq: Three times a day (TID) | INTRAMUSCULAR | Status: DC
  Filled 2024-09-19: qty 1

## 2024-09-19 MED ORDER — ROSUVASTATIN CALCIUM 20 MG PO TABS
20.0000 mg | ORAL_TABLET | Freq: Every day | ORAL | Status: DC
  Administered 2024-09-20 – 2024-09-30 (×11): 20 mg via ORAL
  Filled 2024-09-19 (×11): qty 1

## 2024-09-19 MED ORDER — ONDANSETRON HCL 4 MG/2ML IJ SOLN: 4.0000 mg | Freq: Four times a day (QID) | INTRAMUSCULAR | Status: DC | PRN

## 2024-09-19 MED ORDER — SENNOSIDES-DOCUSATE SODIUM 8.6-50 MG PO TABS: 1.0000 | ORAL_TABLET | Freq: Every evening | ORAL | Status: DC | PRN

## 2024-09-19 MED ORDER — INSULIN ASPART 100 UNIT/ML IJ SOLN
0.0000 [IU] | Freq: Three times a day (TID) | INTRAMUSCULAR | Status: DC
  Administered 2024-09-20 (×2): 8 [IU] via SUBCUTANEOUS
  Administered 2024-09-20: 11 [IU] via SUBCUTANEOUS
  Administered 2024-09-21 (×2): 8 [IU] via SUBCUTANEOUS
  Administered 2024-09-21 – 2024-09-22 (×2): 5 [IU] via SUBCUTANEOUS
  Administered 2024-09-22 – 2024-09-23 (×2): 3 [IU] via SUBCUTANEOUS
  Administered 2024-09-23: 5 [IU] via SUBCUTANEOUS
  Administered 2024-09-24 – 2024-09-25 (×3): 3 [IU] via SUBCUTANEOUS
  Administered 2024-09-26 – 2024-09-28 (×3): 2 [IU] via SUBCUTANEOUS
  Administered 2024-09-28: 17:00:00 3 [IU] via SUBCUTANEOUS
  Administered 2024-09-28 – 2024-09-30 (×5): 2 [IU] via SUBCUTANEOUS
  Administered 2024-09-30: 3 [IU] via SUBCUTANEOUS
  Filled 2024-09-19: qty 3
  Filled 2024-09-19: qty 11
  Filled 2024-09-19: qty 2
  Filled 2024-09-19: qty 3
  Filled 2024-09-19: qty 5
  Filled 2024-09-19: qty 3
  Filled 2024-09-19 (×2): qty 2
  Filled 2024-09-19: qty 3
  Filled 2024-09-19: qty 8
  Filled 2024-09-19: qty 3
  Filled 2024-09-19: qty 2
  Filled 2024-09-19: qty 5
  Filled 2024-09-19: qty 8
  Filled 2024-09-19: qty 2
  Filled 2024-09-19: qty 3
  Filled 2024-09-19: qty 8
  Filled 2024-09-19 (×2): qty 2
  Filled 2024-09-19: qty 5
  Filled 2024-09-19: qty 3
  Filled 2024-09-19: qty 2
  Filled 2024-09-19: qty 8

## 2024-09-19 MED ORDER — SODIUM CHLORIDE 0.9 % IV SOLN: 12.5000 mg | Freq: Four times a day (QID) | INTRAVENOUS | Status: DC | PRN

## 2024-09-19 MED ORDER — ACETAMINOPHEN 650 MG RE SUPP: 650.0000 mg | Freq: Four times a day (QID) | RECTAL | Status: DC | PRN

## 2024-09-19 MED ORDER — OXYCODONE HCL 5 MG PO TABS
5.0000 mg | ORAL_TABLET | ORAL | Status: DC | PRN
  Administered 2024-09-20 (×2): 5 mg via ORAL
  Filled 2024-09-19 (×2): qty 1

## 2024-09-19 MED ORDER — IRBESARTAN 150 MG PO TABS
150.0000 mg | ORAL_TABLET | Freq: Every day | ORAL | Status: DC
  Administered 2024-09-20 – 2024-09-22 (×3): 150 mg via ORAL
  Filled 2024-09-19 (×3): qty 1

## 2024-09-19 MED ORDER — LIDOCAINE 5 % EX PTCH
1.0000 | MEDICATED_PATCH | CUTANEOUS | Status: DC
  Administered 2024-09-19 – 2024-09-30 (×12): 1 via TRANSDERMAL
  Filled 2024-09-19 (×12): qty 1

## 2024-09-19 MED ORDER — KETOROLAC TROMETHAMINE 15 MG/ML IJ SOLN
15.0000 mg | Freq: Once | INTRAMUSCULAR | Status: AC
  Administered 2024-09-19: 15 mg via INTRAMUSCULAR
  Filled 2024-09-19: qty 1

## 2024-09-19 MED ORDER — INSULIN ASPART 100 UNIT/ML IJ SOLN
0.0000 [IU] | Freq: Every day | INTRAMUSCULAR | Status: DC
  Administered 2024-09-19: 3 [IU] via SUBCUTANEOUS
  Administered 2024-09-20: 2 [IU] via SUBCUTANEOUS
  Filled 2024-09-19: qty 2
  Filled 2024-09-19: qty 3

## 2024-09-19 MED ORDER — ACETAMINOPHEN 325 MG PO TABS
650.0000 mg | ORAL_TABLET | Freq: Four times a day (QID) | ORAL | Status: DC | PRN
  Administered 2024-09-20 – 2024-09-24 (×2): 650 mg via ORAL
  Filled 2024-09-19 (×2): qty 2

## 2024-09-19 NOTE — ED Triage Notes (Signed)
 Patient states he also hit head. Denies LOC. Denies thinners.  AAOx3. Skin warm and dry. nad

## 2024-09-19 NOTE — ED Provider Notes (Signed)
 [facility] Provider Note    Event Date/Time   First MD Initiated Contact with Patient 09/19/24 1501     (approximate)   History   Fall   HPI  Shawn Rivas is a 58 y.o. male who comes in for a fall.  Patient was sitting in his computer chair when he reached to grab something and fell onto the floor with left hip pain.  He has some shortening and rotation noted.  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  He did get fentanyl  and route.  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  Not currently on antibiotics secondary to not being able to tolerate it.  He states that he is here today due to concerns for mechanical fall.  Denies feeling sick prior to this.  At baseline he reports numbness in his left foot and inability to wiggle his toes or move his ankle secondary to prior ankle fixation.  He states that his new pain today is in his left hip and left femur after falling directly onto his left hip.  He does report striking his head.  He denies any LOC.  He reports otherwise feeling in his baseline self although having severe pain in the left leg.   Physical Exam   Triage Vital Signs: ED Triage Vitals  Encounter Vitals Group     BP 09/19/24 1344 (!) 172/100     Girls Systolic BP Percentile --      Girls Diastolic BP Percentile --      Boys Systolic BP Percentile --      Boys Diastolic BP Percentile --      Pulse Rate 09/19/24 1344 (!) 102     Resp 09/19/24 1344 16     Temp 09/19/24 1344 97.8 F (36.6 C)     Temp Source 09/19/24 1344 Oral     SpO2 09/19/24 1344 96 %     Weight 09/19/24 1345 226 lb 3.1 oz (102.6 kg)     Height --      Head Circumference --      Peak Flow --      Pain Score 09/19/24 1345 10     Pain Loc --      Pain Education --      Exclude from Growth Chart --     Most recent vital signs: Vitals:   09/19/24 1344  BP: (!) 172/100  Pulse: (!) 102  Resp: 16   Temp: 97.8 F (36.6 C)  SpO2: 96%     General: Awake, no distress.  CV:  Good peripheral perfusion.  Resp:  Normal effort.  No chest wall tenderness Abd:  No distention.  Soft and nontender Other:  Patient has pain on the left hip and left mid femur.  No significant pain on the l left knee, down. Patient has a wrap in her left ankle that he states that I am unable to take down the dressing as it is exposed to air that it would make it worse.  He also reports baseline numbness and inability to wiggle his toes on the left foot.  I am able to feel the pulses through the dressing.  He denies any back pain.  No obvious hematoma to the head.   Dressing taken down ulcerations noted.  No obvious redness, warmth.  Patient has warm well-perfused foot.  ED Results / Procedures / Treatments   Labs (  all labs ordered are listed, but only abnormal results are displayed) Labs Reviewed  CBC WITH DIFFERENTIAL/PLATELET - Abnormal; Notable for the following components:      Result Value   WBC 12.0 (*)    Neutro Abs 9.4 (*)    All other components within normal limits  BASIC METABOLIC PANEL WITH GFR - Abnormal; Notable for the following components:   Sodium 133 (*)    CO2 20 (*)    Glucose, Bld 366 (*)    BUN 27 (*)    All other components within normal limits     EKG  My interpretation of EKG:    RADIOLOGY I have reviewed the xray personally and interpreted patient has significant hardware noted.  There is possibly fracture above the acetabulum.   PROCEDURES:  Critical Care performed: No  Procedures   MEDICATIONS ORDERED IN ED: Medications  lidocaine  (LIDODERM ) 5 % 1 patch (1 patch Transdermal Patch Applied 09/19/24 1755)  fentaNYL  (SUBLIMAZE ) injection 50 mcg (50 mcg Intravenous Given 09/19/24 1512)  oxyCODONE  (Oxy IR/ROXICODONE ) immediate release tablet 5 mg (5 mg Oral Given 09/19/24 1755)  ketorolac  (TORADOL ) 15 MG/ML injection 15 mg (15 mg Intramuscular Given 09/19/24 1755)      IMPRESSION / MDM / ASSESSMENT AND PLAN / ED COURSE  I reviewed the triage vital signs and the nursing notes.   Patient's presentation is most consistent with acute presentation with potential threat to life or bodily function.   Patient comes in with mechanical fall.  Will give patient some IV fentanyl  to help with pain.  Patient appears vascularly intact but difficult to assess neurostatus given baseline neurodeficits in this leg.  Patient's x-ray difficult to interpret secondary to multiple areas of hardware and there is a possible fracture noted but given his significant pain I will order CT imaging to get a better look as well as CT head CT cervical given concern for head trauma as well as distracting injury.  Patient given some fentanyl  to help with pain.  Patient will get some preop labs ordered.  BMP shows elevated glucose similar to prior but creatinine is at baseline.  CBC shows slightly elevated white count.  CT imaging shows mildly displaced periprosthetic fracture of the left greater trochanter. CT of the head and neck are negative.  Discussed with Dr. Ezra from orthopedics.  This is weightbearing as tolerated.  Patient ambulates with a walker at baseline.  Will attempt ambulation but if unable to ambulate patient will need admission for pain control, PT, OT.  6:37 PM patient unable to stand up and ambulate therefore will discuss with the hospital team for admission due to concerns for new fracture, acute pain and inability to ambulate  The patient is on the cardiac monitor to evaluate for evidence of arrhythmia and/or significant heart rate changes.      FINAL CLINICAL IMPRESSION(S) / ED DIAGNOSES   Final diagnoses:  Closed fracture of trochanter of left femur, initial encounter (HCC)  Fall, initial encounter     Rx / DC Orders   ED Discharge Orders     None        Note:  This document was prepared using Dragon voice recognition software and may  include unintentional dictation errors.   Ernest Ronal BRAVO, MD 09/19/24 [phone]

## 2024-09-19 NOTE — ED Notes (Signed)
 Lab at bedside

## 2024-09-19 NOTE — ED Notes (Signed)
 Pt taken to CT.
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
## 2024-09-19 NOTE — ED Notes (Signed)
 Offered patient fentanyl  as ordered, patient states he is ok at this time. Reports pain 0/10 unless he moves his leg.

## 2024-09-19 NOTE — ED Triage Notes (Signed)
 BIB by [facility]. Sitting in computer chair and reached out to grab something and fell and hit the floor.  Left hip shortening and rotation.  Hip replaced 20 years ago.  181/121 99 16 99%  22 RAC 100 mcg Fentanyl  given PTA

## 2024-09-20 ENCOUNTER — Telehealth (HOSPITAL_COMMUNITY): Payer: Self-pay

## 2024-09-20 ENCOUNTER — Encounter: Admitting: Internal Medicine

## 2024-09-20 ENCOUNTER — Other Ambulatory Visit (HOSPITAL_COMMUNITY): Payer: Self-pay

## 2024-09-20 ENCOUNTER — Encounter: Payer: Self-pay | Admitting: Internal Medicine

## 2024-09-20 DIAGNOSIS — M978XXA Periprosthetic fracture around other internal prosthetic joint, initial encounter: Secondary | ICD-10-CM

## 2024-09-20 DIAGNOSIS — Z96649 Presence of unspecified artificial hip joint: Secondary | ICD-10-CM

## 2024-09-20 LAB — CBC
HCT: 43.2 % (ref 39.0–52.0)
Hemoglobin: 14.9 g/dL (ref 13.0–17.0)
MCH: 30.3 pg (ref 26.0–34.0)
MCHC: 34.5 g/dL (ref 30.0–36.0)
MCV: 88 fL (ref 80.0–100.0)
Platelets: 344 K/uL (ref 150–400)
RBC: 4.91 MIL/uL (ref 4.22–5.81)
RDW: 12.7 % (ref 11.5–15.5)
WBC: 9 K/uL (ref 4.0–10.5)
nRBC: 0 % (ref 0.0–0.2)

## 2024-09-20 LAB — BASIC METABOLIC PANEL WITH GFR
Anion gap: 13 (ref 5–15)
BUN: 32 mg/dL — ABNORMAL HIGH (ref 6–20)
CO2: 22 mmol/L (ref 22–32)
Calcium: 9.3 mg/dL (ref 8.9–10.3)
Chloride: 99 mmol/L (ref 98–111)
Creatinine, Ser: 1.5 mg/dL — ABNORMAL HIGH (ref 0.61–1.24)
GFR, Estimated: 54 mL/min — ABNORMAL LOW (ref >60)
Glucose, Bld: 324 mg/dL — ABNORMAL HIGH (ref 70–99)
Potassium: 4.3 mmol/L (ref 3.5–5.1)
Sodium: 134 mmol/L — ABNORMAL LOW (ref 135–145)

## 2024-09-20 LAB — GLUCOSE, CAPILLARY
Glucose-Capillary: 292 mg/dL — ABNORMAL HIGH (ref 70–99)
Glucose-Capillary: 326 mg/dL — ABNORMAL HIGH (ref 70–99)
Glucose-Capillary: 248 mg/dL — ABNORMAL HIGH (ref 70–99)
Glucose-Capillary: 250 mg/dL — ABNORMAL HIGH (ref 70–99)

## 2024-09-20 LAB — HIV ANTIBODY (ROUTINE TESTING W REFLEX): HIV Screen 4th Generation wRfx: NONREACTIVE

## 2024-09-20 MED ORDER — INSULIN GLARGINE 100 UNIT/ML ~~LOC~~ SOLN
35.0000 [IU] | Freq: Every day | SUBCUTANEOUS | Status: DC
  Administered 2024-09-20: 35 [IU] via SUBCUTANEOUS
  Filled 2024-09-20 (×2): qty 0.35

## 2024-09-20 MED ORDER — ALPRAZOLAM 1 MG PO TABS
1.0000 mg | ORAL_TABLET | Freq: Every evening | ORAL | Status: DC | PRN
  Administered 2024-09-21 – 2024-09-23 (×3): 1 mg via ORAL
  Filled 2024-09-20 (×3): qty 1

## 2024-09-20 MED ORDER — OXYCODONE HCL 5 MG PO TABS
5.0000 mg | ORAL_TABLET | ORAL | Status: DC | PRN
  Administered 2024-09-20: 10 mg via ORAL
  Administered 2024-09-20: 5 mg via ORAL
  Administered 2024-09-21 – 2024-09-22 (×4): 10 mg via ORAL
  Administered 2024-09-23: 5 mg via ORAL
  Administered 2024-09-23 (×2): 10 mg via ORAL
  Administered 2024-09-23: 5 mg via ORAL
  Filled 2024-09-20 (×3): qty 2
  Filled 2024-09-20 (×2): qty 1
  Filled 2024-09-20 (×4): qty 2

## 2024-09-20 MED ORDER — ORAL CARE MOUTH RINSE: 15.0000 mL | OROMUCOSAL | Status: DC | PRN

## 2024-09-20 NOTE — Consult Note (Addendum)
 WOC Nurse Consult Note: Reason for Consult: Consult requested for left foot wounds.  Pt is followed by the outpatient wound care center and home health assistance for dressing changes.  He wanted to call and have me speak to both of those nurses that follow his case.  He was previously using Hydroferra blue, but I informed the patient that we do not carry that topical treatment in the [facility] formulary and I will substitute a similar product, Aquacel, and both nurses were in agreement, along with the patient, for this plan of care.  The wound care center states they have ordered a total contact cast to be applied when the patient has his next visit at the outpatient wound care center.  Wound type: Left plantar foot with full thickness chronic wound, red and dry; .8X.3X.2cm, no odor, drainage or fluctuance.  Dry yellow slightly raised callous surrounding the wound edges.    Left outer ankle with chronic full thickness wound; red and dry, 1X.5X.2cm, no odor, drainage, or fluctuance.   Dressing procedure/placement/frequency: Topical treatment orders provided for bedside nurses to perform as follows: Bedside nurse; please change dressing to left plantar foot and left outer ankle Q WED/SAT/MON as follows: Cut piece of Aquacel Soila # [phone]) and apply to both wounds each time, then cover with foam dressing.  Moisten previous dressings with NS to assist with removal each time.  Pt should resume follow-up at the outpatient wound care center after discharge.   Please re-consult if further assistance is needed.  Thank-you,  Stephane Fought MSN, RN, CWOCN, CWCN-AP, CNS Contact Mon-Fri 0700-1500: [phone]

## 2024-09-20 NOTE — Evaluation (Signed)
 Physical Therapy Evaluation Patient Details Name: Shawn Rivas MRN: [id number] DOB: 15-Jun-1966 Today's Date: 09/20/2024  History of Present Illness  Shawn Rivas is a 58 y.o. year old male with medical history of TBI, HTN, HLD, T2DM presenting to the ED after a fall. Sustained a L periprosthetic fx, non surgical.  Clinical Impression  Patient received in bed, he is agreeable to PT/OT assessment, although states he cannot get up due to wound on foot. Patient required mod A +2 for bed mobility due to increased pain. He is unable to scoot out to edge of bed with assistance due to pain. Therefore returned to bed. Patient will continue to benefit from skilled PT to improve functional independence.           If plan is discharge home, recommend the following: Two people to help with walking and/or transfers;A lot of help with bathing/dressing/bathroom;Assist for transportation   Can travel by private vehicle   No    Equipment Recommendations Wheelchair (measurements PT);Wheelchair cushion (measurements PT)  Recommendations for Other Services       Functional Status Assessment Patient has had a recent decline in their functional status and demonstrates the ability to make significant improvements in function in a reasonable and predictable amount of time.     Precautions / Restrictions Restrictions Other Position/Activity Restrictions: patient also has wound on the bottom of his left foot which limits his ability to put weight on that leg.      Mobility  Bed Mobility Overal bed mobility: Needs Assistance Bed Mobility: Supine to Sit, Sit to Supine     Supine to sit: Mod assist, HOB elevated, Used rails, +2 for physical assistance Sit to supine: Mod assist, Used rails, +2 for physical assistance   General bed mobility comments: patient is pain limited, requires cues for mobility and sequencing.    Transfers                   General transfer comment: unable, could not  scoot forward in sitting due to pain    Ambulation/Gait               General Gait Details: unable  Stairs            Wheelchair Mobility     Tilt Bed    Modified Rankin (Stroke Patients Only)       Balance Overall balance assessment: Needs assistance Sitting-balance support: Feet unsupported, Bilateral upper extremity supported Sitting balance-Leahy Scale: Fair Sitting balance - Comments: unable to get all the way to edge of bed due to pain                                     Pertinent Vitals/Pain Pain Assessment Pain Assessment: 0-10 Pain Score: 10-Worst pain ever Pain Location: L hip with movement Pain Descriptors / Indicators: Discomfort, Grimacing, Guarding, Other (Comment) (swearing) Pain Intervention(s): Monitored during session, Repositioned    Home Living Family/patient expects to be discharged to:: Private residence Living Arrangements: Alone Available Help at Discharge: Available PRN/intermittently;Family Type of Home: House Home Access: Ramped entrance       Home Layout: One level Home Equipment: Agricultural Consultant (2 wheels)      Prior Function Prior Level of Function : Independent/Modified Independent             Mobility Comments: was not very ambulatory prior to coming in due to L foot  wound. ADLs Comments: was independent prior to admission     Extremity/Trunk Assessment   Upper Extremity Assessment Upper Extremity Assessment: Defer to OT evaluation    Lower Extremity Assessment Lower Extremity Assessment: LLE deficits/detail LLE: Unable to fully assess due to pain LLE Coordination: decreased gross motor    Cervical / Trunk Assessment Cervical / Trunk Assessment: Normal  Communication   Communication Communication: Impaired Factors Affecting Communication: Difficulty expressing self    Cognition Arousal: Alert Behavior During Therapy: Anxious   PT - Cognitive impairments: Problem solving,  Safety/Judgement, Sequencing                       PT - Cognition Comments: TBI history Following commands: Impaired Following commands impaired: Follows one step commands inconsistently, Follows one step commands with increased time     Cueing Cueing Techniques: Verbal cues, Gestural cues, Tactile cues     General Comments      Exercises     Assessment/Plan    PT Assessment Patient needs continued PT services  PT Problem List Decreased strength;Decreased range of motion;Decreased activity tolerance;Decreased balance;Decreased mobility;Pain;Decreased cognition;Decreased safety awareness;Decreased knowledge of precautions;Decreased knowledge of use of DME;Impaired sensation;Decreased skin integrity       PT Treatment Interventions DME instruction;Gait training;Functional mobility training;Therapeutic activities;Therapeutic exercise;Patient/family education;Cognitive remediation;Wheelchair mobility training    PT Goals (Current goals can be found in the Care Plan section)  Acute Rehab PT Goals Patient Stated Goal: to improve pain and mobility PT Goal Formulation: With patient Time For Goal Achievement: 10/04/24 Potential to Achieve Goals: Fair    Frequency Min 2X/week     Co-evaluation PT/OT/SLP Co-Evaluation/Treatment: Yes Reason for Co-Treatment: For patient/therapist safety;To address functional/ADL transfers PT goals addressed during session: Mobility/safety with mobility;Balance         AM-PAC PT 6 Clicks Mobility  Outcome Measure Help needed turning from your back to your side while in a flat bed without using bedrails?: A Lot Help needed moving from lying on your back to sitting on the side of a flat bed without using bedrails?: A Lot Help needed moving to and from a bed to a chair (including a wheelchair)?: Total Help needed standing up from a chair using your arms (e.g., wheelchair or bedside chair)?: Total Help needed to walk in hospital room?:  Total Help needed climbing 3-5 steps with a railing? : Total 6 Click Score: 8    End of Session Equipment Utilized During Treatment: Gait belt Activity Tolerance: Patient limited by pain Patient left: in bed;with call bell/phone within reach;with bed alarm set Nurse Communication: Mobility status;Patient requests pain meds PT Visit Diagnosis: Other abnormalities of gait and mobility (R26.89);Muscle weakness (generalized) (M62.81);Pain Pain - Right/Left: Left Pain - part of body: Hip;Leg;Ankle and joints of foot    Time: 8681-8660 PT Time Calculation (min) (ACUTE ONLY): 21 min   Charges:   PT Evaluation $PT Eval Moderate Complexity: 1 Mod   PT General Charges $$ ACUTE PT VISIT: 1 Visit         Ieshia Hatcher, PT, GCS 09/20/24,1:56 PM

## 2024-09-20 NOTE — Evaluation (Signed)
 Occupational Therapy Evaluation Patient Details Name: Shawn Rivas MRN: [id number] DOB: 30-Sep-1966 Today's Date: 09/20/2024   History of Present Illness   Shawn Rivas is a 58 y.o. year old male with medical history of TBI, HTN, HLD, T2DM presenting to the ED after a fall. Sustained a L periprosthetic fx, non surgical.     Clinical Impressions Patient was seen for OT evaluation this date. Prior to hospital admission, patient was living alone, limited mobility due to wound to L foot but was managing ADLs on his own. Patient limited by pain throughout OT/PT eval, mod-max A x 2 to transition from supine to EOB, unable to scoot or be scooted to sit EOB due to pain; returned to bed. Patient presents with deficits in pain tolerance, bed mobility, standing ability, affecting safe and optimal ADL completion. Patient is currently requiring max A for ADLs.  Paient would benefit from skilled OT services to address noted impairments and functional limitations (see below for any additional details) in order to maximize safety and independence while minimizing future risk of falls, injury, and readmission. Anticipate the need for follow up OT services upon acute hospital DC.      If plan is discharge home, recommend the following:   Two people to help with walking and/or transfers;A lot of help with bathing/dressing/bathroom;Assist for transportation;Help with stairs or ramp for entrance     Functional Status Assessment   Patient has had a recent decline in their functional status and demonstrates the ability to make significant improvements in function in a reasonable and predictable amount of time.     Equipment Recommendations   Other (comment) (defer to next venue of care)     Recommendations for Other Services         Precautions/Restrictions   Precautions Precautions: Fall (Simultaneous filing. User may not have seen previous data.) Recall of Precautions/Restrictions:  Intact (Simultaneous filing. User may not have seen previous data.) Precaution/Restrictions Comments: no side stepping Restrictions Weight Bearing Restrictions Per Provider Order: Yes (Simultaneous filing. User may not have seen previous data.) LLE Weight Bearing Per Provider Order: Weight bearing as tolerated (Simultaneous filing. User may not have seen previous data.)     Mobility Bed Mobility Overal bed mobility: Needs Assistance Bed Mobility: Supine to Sit, Sit to Supine     Supine to sit: Mod assist, Max assist, +2 for physical assistance Sit to supine: Mod assist, Max assist, +2 for physical assistance   General bed mobility comments: limited by pain, A to move LLE and to lift trunk    Transfers                   General transfer comment: unable, patient not able to scoot forward to EOB      Balance Overall balance assessment: Needs assistance Sitting-balance support: Feet unsupported Sitting balance-Leahy Scale: Fair                                     ADL either performed or assessed with clinical judgement   ADL Overall ADL's : Needs assistance/impaired             Lower Body Bathing: Maximal assistance       Lower Body Dressing: Maximal assistance   Toilet Transfer: Total assistance   Toileting- Clothing Manipulation and Hygiene: Total assistance         General ADL Comments: patient unable to tolerate  sitting EOB due to pain; unable to reach down to B distal LE to don socks     Vision         Perception         Praxis         Pertinent Vitals/Pain Pain Assessment Pain Assessment: 0-10 Pain Score: 10-Worst pain ever Pain Descriptors / Indicators: Discomfort, Aching, Sharp Pain Intervention(s): Limited activity within patient's tolerance, Monitored during session, Patient requesting pain meds-RN notified     Extremity/Trunk Assessment Upper Extremity Assessment Upper Extremity Assessment: Defer to OT  evaluation   Lower Extremity Assessment Lower Extremity Assessment: LLE deficits/detail LLE: Unable to fully assess due to pain LLE Coordination: decreased gross motor   Cervical / Trunk Assessment Cervical / Trunk Assessment: Normal   Communication Communication Communication: Impaired Factors Affecting Communication: Difficulty expressing self   Cognition Arousal: Alert Behavior During Therapy: WFL for tasks assessed/performed Cognition: No apparent impairments                               Following commands: Intact       Cueing  General Comments   Cueing Techniques: Verbal cues      Exercises     Shoulder Instructions      Home Living Family/patient expects to be discharged to:: Private residence Living Arrangements: Alone Available Help at Discharge: Available PRN/intermittently;Family Type of Home: House Home Access: Ramped entrance     Home Layout: One level     Bathroom Shower/Tub: Producer, Television/film/video: Standard Bathroom Accessibility: Yes   Home Equipment: Agricultural Consultant (2 wheels)          Prior Functioning/Environment Prior Level of Function : Independent/Modified Independent             Mobility Comments: was not very ambulatory prior to coming in due to L foot wound. ADLs Comments: was independent prior to admission    OT Problem List: Decreased strength;Decreased activity tolerance;Impaired balance (sitting and/or standing);Decreased safety awareness;Pain   OT Treatment/Interventions: Self-care/ADL training;Therapeutic exercise;Energy conservation;DME and/or AE instruction;Patient/family education;Balance training      OT Goals(Current goals can be found in the care plan section)   Acute Rehab OT Goals Patient Stated Goal: to go home OT Goal Formulation: With patient Time For Goal Achievement: 10/04/24 Potential to Achieve Goals: Fair ADL Goals Pt Will Perform Grooming: with  supervision;sitting;standing Pt Will Perform Lower Body Dressing: with min assist;sit to/from stand Pt Will Transfer to Toilet: stand pivot transfer;bedside commode   OT Frequency:  Min 2X/week    Co-evaluation   Reason for Co-Treatment: For patient/therapist safety;To address functional/ADL transfers PT goals addressed during session: Mobility/safety with mobility;Balance        AM-PAC OT 6 Clicks Daily Activity     Outcome Measure Help from another person eating meals?: None Help from another person taking care of personal grooming?: A Little Help from another person toileting, which includes using toliet, bedpan, or urinal?: A Lot Help from another person bathing (including washing, rinsing, drying)?: A Lot   Help from another person to put on and taking off regular lower body clothing?: Total 6 Click Score: 12   End of Session Nurse Communication: Mobility status;Patient requests pain meds  Activity Tolerance: Patient limited by pain Patient left: in bed;with call bell/phone within reach;with bed alarm set  OT Visit Diagnosis: Unsteadiness on feet (R26.81);Muscle weakness (generalized) (M62.81);History of falling (Z91.81)  Time: 8681-8660 OT Time Calculation (min): 21 min Charges:  OT General Charges $OT Visit: 1 Visit OT Evaluation $OT Eval Low Complexity: 1 Low  Rogers Clause, OT/L MSOT, 09/20/2024

## 2024-09-20 NOTE — Plan of Care (Signed)

## 2024-09-20 NOTE — Progress Notes (Signed)
 Progress Note   Patient: Shawn Rivas FMW:[id number] DOB: 15-Apr-1966 DOA: 09/19/2024     1 DOS: the patient was seen and examined on 09/20/2024   Brief hospital course: Per H&P HPI CREWS MCCOLLAM is a 58 y.o. year old male with medical history of HTN, HLD, T2DM presenting to the ED after a fall.    Patient states he fell from standing position as he was trying to reach something but slipped off the chair hurting his left hip. He states he lives alone and needs thinks he may need more help then he previously did.    On arrival to the ED patient was noted to be HDS stable.  Lab work and imaging obtained.  CBC with mild leukocytosis otherwise unremarkable.  BMP with mild hyponatremia and significant hyperglycemia otherwise normal renal function.  Imaging showed acute appearing, mildly displaced periprosthetic fracture of the left greater trochanter.  Given this, EDP discussed with Dr. Welford from orthopedic who recommended that this was to be managed nonoperatively with weightbearing as tolerated.  Given this, [facility] contacted for admission.   Review of Systems: As mentioned in the history of present illness. All other systems reviewed and are negative.    Assessment and Plan: Periprosthetic fracture of L greater trochanter repair  Manage nonoperatively per Ortho.  Weightbearing as tolerated.  Pain control.  Patient has wounds on left foot that causes him significant pain with ambulation.  He is supposed to get a cast for his left foot.  T2DM complicated by chronic foot ulcers Uncontrolled A1c 12.72 months ago.  Patient is currently not taking his insulin  but is taking metformin .  - Start Lantus  35 units plus continue sliding scale insulin  - Hold metformin  while inpatient - Foot ulcers do not appear infected he sees wound care outpatient for this, and care consulted  HLD Not currently on statin  LDL 158 6 months ago.  Will need to discuss this with his PCP on discharge.  AKI on  CKD2A2 With significant microalbuminuria. Encouraged adherence with antidiabetes medication.  On ARB.  Will continue to monitor.   HTN Continue formulary equivalent of patient's home medication.     Subjective: Not in any pain at rest.  Physical Exam: Vitals:   09/20/24 0400 09/20/24 0500 09/20/24 0700 09/20/24 1500  BP: (!) 160/86  (!) 157/100 (!) 171/105  Pulse: (!) 101  (!) 106 (!) 101  Resp: 18  17 17   Temp: 98.8 F (37.1 C)  99.4 F (37.4 C) 99.4 F (37.4 C)  TempSrc:    Oral  SpO2: 95%  99% 97%  Weight:  104.8 kg    Height:       Physical Exam  Constitutional: In no distress.  Cardiovascular: Normal rate, regular rhythm. No lower extremity edema  Pulmonary: Non labored breathing on room air, no wheezing or rales.   Abdominal: Soft. Non distended and non tender Musculoskeletal: Normal range of motion.     Neurological: Alert and oriented to person, place, and time. Non focal  Skin: Skin is warm and dry.  Plantar ulcers on left foot, no drainage, no surrounding erythema   Data Reviewed:     Latest Ref Rng & Units 09/20/2024    5:44 AM 09/19/2024    5:43 PM 06/03/2024    2:45 AM  BMP  Glucose 70 - 99 mg/dL 675  633  554   BUN 6 - 20 mg/dL 32  27  27   Creatinine 0.61 - 1.24 mg/dL 8.49  1.12  1.43   Sodium 135 - 145 mmol/L 134  133  133   Potassium 3.5 - 5.1 mmol/L 4.3  4.1  4.0   Chloride 98 - 111 mmol/L 99  100  99   CO2 22 - 32 mmol/L 22  20  26    Calcium  8.9 - 10.3 mg/dL 9.3  9.5  9.0       Latest Ref Rng & Units 09/20/2024    5:44 AM 09/19/2024    5:43 PM 06/03/2024    2:45 AM  CBC  WBC 4.0 - 10.5 K/uL 9.0  12.0  14.7   Hemoglobin 13.0 - 17.0 g/dL 85.0  84.5  85.9   Hematocrit 39.0 - 52.0 % 43.2  43.4  40.5   Platelets 150 - 400 K/uL 344  348  365      Family Communication: None at bedside.   Disposition: Status is: Inpatient Remains inpatient appropriate because: management of pain of periprosthetic fracture.   Planned Discharge Destination:  Pending PT/OT eval     Time spent: 35 minutes  Author: Alban Pepper, MD 09/20/2024 4:54 PM  For on call review [email or web address].

## 2024-09-20 NOTE — Telephone Encounter (Signed)
 Pharmacy Patient Advocate Encounter  Insurance verification completed.    The patient is insured through [facility]. Patient has Medicare and is not eligible for a copay card, but may be able to apply for patient assistance or Medicare RX Payment Plan (Patient Must reach out to their plan, if eligible for payment plan), if available.    Ran test claim for Lantus  100unit Pen and the current 30 day co-pay is $35.  Ran test claim for Generic Humalog  100unit Pen and the current 30 day co-pay is $35.  This test claim was processed through [facility]- copay amounts may vary at other pharmacies due to [employer], or as the patient moves through the different stages of their insurance plan.
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
## 2024-09-20 NOTE — Inpatient Diabetes Management (Addendum)
 Inpatient Diabetes Program Recommendations  AACE/ADA: New Consensus Statement on Inpatient Glycemic Control  Target Ranges:  Prepandial:   less than 140 mg/dL      Peak postprandial:   less than 180 mg/dL (1-2 hours)      Critically ill patients:  140 - 180 mg/dL    Latest Reference Range & Units 09/19/24 21:43 09/20/24 09:05 09/20/24 12:05  Glucose-Capillary 70 - 99 mg/dL 711 (H) 707 (H) 673 (H)    Latest Reference Range & Units 07/03/24 15:16  Hemoglobin A1C 4.0 - 5.6 % 12.7 !   Review of Glycemic Control  Diabetes history: DM2 Outpatient Diabetes medications: Metformin  1500 mg daily, Tresiba  30 units daily (prescribed 40 units but was rationing insulin ; ran out several weeks ago and can not afford to get filled) Current orders for Inpatient glycemic control: Novolog  0-15 units TID with meals, Novolog  0-5 units QHS  Inpatient Diabetes Program Recommendations:    Insulin : Please consider ordering insulin  glargine 35 units Q24H to start now.  Outpatient DM: Patient reports that he ran out of insulin  and not able to afford to get it refilled. Will need Rx for Lantus  pens [phone]) and Humalog  insulin  pens (#[id number]) as they are insulins covered under insurance and copay is $35 each.  NOTE: Patient admitted after fall with femur fracture. Called patient over the phone to inquire about DM medications he is taking outpatient. Patient states that the only thing he is taking currently is Metformin . Patient states he was taking Tresiba  30 units daily (prescribed 40 units daily but was rationing insulin ) and he could not afford to get it refilled. Patient states that he was using Dexcom G7 sensors but he does not have any more sensors at home and waiting for them to be delivered through supplier. Discussed importance of glycemic control to decrease risk of further complications and to promote wound healing. Informed patient I have a sample of Dexcom G7 sensor I will bring him and I would ask outpatient  [facility] pharmacy to check and see which insulins are preferred and copay.   Addendum 09/20/24@14 :45-Took patient Dexcom G7 sensors (2) and informed patient that his insurance covers Lantus  and Humalog  insulin  and copay is $35 each. Patient states that he use to be on Lantus  and Humalog  in the past but his new PCP changed his insulins. Patient states that he would like to have the Rx for Lantus  and Humalog  send to [facility] in [place]. Stressed importance of getting insulin  filled and taking it as prescribed to get DM under control.   Thanks, Earnie Gainer, RN, MSN, CDCES Diabetes Coordinator Inpatient Diabetes Program [phone] (Team Pager from 8am to 5pm)

## 2024-09-21 DIAGNOSIS — M978XXD Periprosthetic fracture around other internal prosthetic joint, subsequent encounter: Secondary | ICD-10-CM

## 2024-09-21 LAB — BASIC METABOLIC PANEL WITH GFR
Anion gap: 13 (ref 5–15)
BUN: 32 mg/dL — ABNORMAL HIGH (ref 6–20)
CO2: 20 mmol/L — ABNORMAL LOW (ref 22–32)
Calcium: 8.8 mg/dL — ABNORMAL LOW (ref 8.9–10.3)
Chloride: 99 mmol/L (ref 98–111)
Creatinine, Ser: 1.53 mg/dL — ABNORMAL HIGH (ref 0.61–1.24)
GFR, Estimated: 52 mL/min — ABNORMAL LOW (ref >60)
Glucose, Bld: 280 mg/dL — ABNORMAL HIGH (ref 70–99)
Potassium: 3.9 mmol/L (ref 3.5–5.1)
Sodium: 132 mmol/L — ABNORMAL LOW (ref 135–145)

## 2024-09-21 LAB — CBC
HCT: 40 % (ref 39.0–52.0)
Hemoglobin: 13.9 g/dL (ref 13.0–17.0)
MCH: 30.5 pg (ref 26.0–34.0)
MCHC: 34.8 g/dL (ref 30.0–36.0)
MCV: 87.9 fL (ref 80.0–100.0)
Platelets: 318 K/uL (ref 150–400)
RBC: 4.55 MIL/uL (ref 4.22–5.81)
RDW: 12.7 % (ref 11.5–15.5)
WBC: 9.2 K/uL (ref 4.0–10.5)
nRBC: 0 % (ref 0.0–0.2)

## 2024-09-21 LAB — GLUCOSE, CAPILLARY
Glucose-Capillary: 263 mg/dL — ABNORMAL HIGH (ref 70–99)
Glucose-Capillary: 294 mg/dL — ABNORMAL HIGH (ref 70–99)
Glucose-Capillary: 213 mg/dL — ABNORMAL HIGH (ref 70–99)
Glucose-Capillary: 131 mg/dL — ABNORMAL HIGH (ref 70–99)

## 2024-09-21 MED ORDER — METFORMIN HCL ER 750 MG PO TB24
1500.0000 mg | ORAL_TABLET | Freq: Every day | ORAL | Status: DC
  Administered 2024-09-22 – 2024-09-30 (×8): 1500 mg via ORAL
  Filled 2024-09-21 (×10): qty 2

## 2024-09-21 MED ORDER — INSULIN GLARGINE 100 UNIT/ML ~~LOC~~ SOLN
40.0000 [IU] | Freq: Every day | SUBCUTANEOUS | Status: DC
  Administered 2024-09-21 – 2024-09-22 (×2): 40 [IU] via SUBCUTANEOUS
  Filled 2024-09-21 (×3): qty 0.4

## 2024-09-21 MED ORDER — INSULIN ASPART 100 UNIT/ML IJ SOLN: 5.0000 [IU] | Freq: Three times a day (TID) | INTRAMUSCULAR | Status: DC

## 2024-09-21 MED ORDER — ENOXAPARIN SODIUM 60 MG/0.6ML IJ SOSY
0.5000 mg/kg | PREFILLED_SYRINGE | INTRAMUSCULAR | Status: DC
  Administered 2024-09-21 – 2024-09-30 (×9): 55 mg via SUBCUTANEOUS
  Filled 2024-09-21 (×10): qty 0.6

## 2024-09-21 MED ORDER — METOCLOPRAMIDE HCL 5 MG PO TABS
10.0000 mg | ORAL_TABLET | Freq: Once | ORAL | Status: AC
  Administered 2024-09-21: 10 mg via ORAL
  Filled 2024-09-21: qty 2

## 2024-09-21 MED ORDER — INSULIN ASPART 100 UNIT/ML IJ SOLN
8.0000 [IU] | Freq: Three times a day (TID) | INTRAMUSCULAR | Status: DC
  Administered 2024-09-21 – 2024-09-24 (×8): 8 [IU] via SUBCUTANEOUS
  Filled 2024-09-21 (×8): qty 8

## 2024-09-21 MED ORDER — DIPHENHYDRAMINE HCL 25 MG PO CAPS
25.0000 mg | ORAL_CAPSULE | Freq: Every day | ORAL | Status: DC | PRN
  Administered 2024-09-21 – 2024-09-25 (×4): 25 mg via ORAL
  Filled 2024-09-21 (×8): qty 1

## 2024-09-21 MED ORDER — ONDANSETRON 4 MG PO TBDP
4.0000 mg | ORAL_TABLET | Freq: Four times a day (QID) | ORAL | Status: DC | PRN
  Administered 2024-09-21 – 2024-09-26 (×3): 4 mg via ORAL
  Filled 2024-09-21 (×3): qty 1

## 2024-09-21 MED ORDER — METOCLOPRAMIDE HCL 5 MG/ML IJ SOLN: 10.0000 mg | Freq: Once | INTRAMUSCULAR | Status: DC

## 2024-09-21 NOTE — Progress Notes (Signed)
 Occupational Therapy Treatment Patient Details Name: Shawn Rivas MRN: [id number] DOB: 05-13-1966 Today's Date: 09/21/2024   History of present illness Shawn Rivas is a 58 y.o. year old male with medical history of TBI, HTN, HLD, T2DM presenting to the ED after a fall. Sustained a L periprosthetic fx, non surgical.   OT comments  Shawn Rivas was seen for OT/PT co-treatment on this date. Upon arrival to room pt in bed, agreeable to tx with encouragement - states I was pain free at 3am where were you then? Pt requires MOD A x2 exit bed, significantly increased time due to pain. Once sitting sit<>stand MIN A x2 + RW, tolerates ~1 min standing. Assist for lateral leans in sitting to change bed pads. On return to bed +emesis with RN for medication end of session. Pt making progress toward goals, will continue to follow POC. Discharge recommendation remains appropriate.       If plan is discharge home, recommend the following:  Two people to help with walking and/or transfers;A lot of help with bathing/dressing/bathroom;Assist for transportation;Help with stairs or ramp for entrance   Equipment Recommendations  Other (comment)    Recommendations for Other Services      Precautions / Restrictions Precautions Precautions: Fall Recall of Precautions/Restrictions: Intact Restrictions Weight Bearing Restrictions Per Provider Order: Yes LLE Weight Bearing Per Provider Order: Weight bearing as tolerated       Mobility Bed Mobility Overal bed mobility: Needs Assistance Bed Mobility: Supine to Sit, Sit to Supine     Supine to sit: Mod assist, HOB elevated, Used rails, +2 for physical assistance Sit to supine: Used rails, +2 for physical assistance, Max assist        Transfers Overall transfer level: Needs assistance Equipment used: Rolling walker (2 wheels) Transfers: Sit to/from Stand Sit to Stand: Min assist, +2 physical assistance, From elevated surface                  Balance Overall balance assessment: Needs assistance Sitting-balance support: Feet unsupported, Bilateral upper extremity supported Sitting balance-Leahy Scale: Fair     Standing balance support: Bilateral upper extremity supported Standing balance-Leahy Scale: Fair                             ADL either performed or assessed with clinical judgement   ADL Overall ADL's : Needs assistance/impaired                                       General ADL Comments: TOTAL A don B socks in sitting     Communication Communication Communication: Impaired Factors Affecting Communication: Difficulty expressing self   Cognition Arousal: Alert Behavior During Therapy: Anxious Cognition: No apparent impairments                               Following commands: Impaired Following commands impaired: Follows one step commands inconsistently, Follows one step commands with increased time      Cueing   Cueing Techniques: Verbal cues, Gestural cues, Tactile cues             Pertinent Vitals/ Pain       Pain Assessment Pain Assessment: 0-10 Pain Score: 10-Worst pain ever Pain Location: L hip with movement Pain Descriptors / Indicators: Discomfort, Grimacing, Guarding, Other (Comment)  Pain Intervention(s): Premedicated before session   Frequency  Min 2X/week        Progress Toward Goals  OT Goals(current goals can now be found in the care plan section)  Progress towards OT goals: Progressing toward goals  Acute Rehab OT Goals OT Goal Formulation: With patient Time For Goal Achievement: 10/04/24 Potential to Achieve Goals: Fair ADL Goals Pt Will Perform Grooming: with supervision;sitting;standing Pt Will Perform Lower Body Dressing: with min assist;sit to/from stand Pt Will Transfer to Toilet: stand pivot transfer;bedside commode  Plan      Co-evaluation    PT/OT/SLP Co-Evaluation/Treatment: Yes Reason for Co-Treatment: For  patient/therapist safety;Necessary to address cognition/behavior during functional activity;To address functional/ADL transfers PT goals addressed during session: Mobility/safety with mobility;Balance;Proper use of DME OT goals addressed during session: ADL's and self-care      AM-PAC OT 6 Clicks Daily Activity     Outcome Measure   Help from another person eating meals?: None Help from another person taking care of personal grooming?: A Little Help from another person toileting, which includes using toliet, bedpan, or urinal?: A Lot Help from another person bathing (including washing, rinsing, drying)?: A Lot Help from another person to put on and taking off regular upper body clothing?: A Lot Help from another person to put on and taking off regular lower body clothing?: Total 6 Click Score: 14    End of Session Equipment Utilized During Treatment: Rolling walker (2 wheels);Gait belt  OT Visit Diagnosis: Unsteadiness on feet (R26.81);Muscle weakness (generalized) (M62.81);History of falling (Z91.81)   Activity Tolerance Patient limited by pain   Patient Left in bed;with call bell/phone within reach;with nursing/sitter in room   Nurse Communication          Time: [phone] OT Time Calculation (min): 26 min  Charges: OT General Charges $OT Visit: 1 Visit OT Treatments $Self Care/Home Management : 8-22 mins  Elston Slot, M.S. OTR/L  09/21/2024, 10:23 AM  ascom [phone]

## 2024-09-21 NOTE — Progress Notes (Signed)
 Patient vomited x 1 in blue bag, given PRN zofran  sublingual    09/21/24 0900  Emesis Measurement/Characteristics  Emesis (mL) 200 mL  Emesis Appearance Undigested food

## 2024-09-21 NOTE — Progress Notes (Signed)
 Physical Therapy Treatment Patient Details Name: Shawn Rivas MRN: [id number] DOB: Jan 27, 1966 Today's Date: 09/21/2024   History of Present Illness Shawn Rivas is a 58 y.o. year old male with medical history of TBI, HTN, HLD, T2DM presenting to the ED after a fall. Sustained a L periprosthetic fx, non surgical.    PT Comments  Patient received in bed, he is agreeable to PT/OT session although states it has not been a good morning. Patient slightly agitated throughout session. He requires min A for L LE moving in bed. Cues for sequencing. He was able to stand at edge of bed briefly with RW. Limited by nausea and dizziness in standing and vomited upon returning to bed which he required +2 mod/max A for. Patient will continue to benefit from skilled PT to improve functional independence and safety.     If plan is discharge home, recommend the following: Two people to help with walking and/or transfers;A lot of help with bathing/dressing/bathroom;Assist for transportation   Can travel by private vehicle     No  Equipment Recommendations  Wheelchair cushion (measurements PT);Wheelchair (measurements PT)    Recommendations for Other Services       Precautions / Restrictions Precautions Precautions: Fall Recall of Precautions/Restrictions: Intact Precaution/Restrictions Comments: no side stepping Restrictions Weight Bearing Restrictions Per Provider Order: Yes LLE Weight Bearing Per Provider Order: Weight bearing as tolerated Other Position/Activity Restrictions: patient also has wound on the bottom of his left foot which limits his ability to put weight on that leg.     Mobility  Bed Mobility Overal bed mobility: Needs Assistance Bed Mobility: Supine to Sit, Sit to Supine     Supine to sit: Min assist, HOB elevated, Used rails Sit to supine: Mod assist, +2 for physical assistance   General bed mobility comments: patient is pain limited, requires cues for mobility and  sequencing.    Transfers Overall transfer level: Needs assistance Equipment used: Rolling walker (2 wheels) Transfers: Sit to/from Stand Sit to Stand: Min assist, +2 physical assistance, From elevated surface           General transfer comment: able to stand at edge of bed briefly. Limited by dizziness and nausea. Vomited after returning to bed.    Ambulation/Gait               General Gait Details: unable   Stairs             Wheelchair Mobility     Tilt Bed    Modified Rankin (Stroke Patients Only)       Balance Overall balance assessment: Needs assistance Sitting-balance support: Feet supported Sitting balance-Leahy Scale: Good     Standing balance support: Bilateral upper extremity supported, During functional activity, Reliant on assistive device for balance Standing balance-Leahy Scale: Fair                              Hotel Manager: Impaired Factors Affecting Communication: Difficulty expressing self  Cognition Arousal: Alert Behavior During Therapy: Anxious   PT - Cognitive impairments: Problem solving, Safety/Judgement, Sequencing                       PT - Cognition Comments: TBI history Following commands: Impaired Following commands impaired: Follows one step commands inconsistently, Follows one step commands with increased time    Cueing Cueing Techniques: Verbal cues, Gestural cues, Tactile cues  Exercises  General Comments        Pertinent Vitals/Pain Pain Assessment Pain Assessment: Faces Faces Pain Scale: Hurts whole lot Pain Location: L hip with movement Pain Descriptors / Indicators: Discomfort, Grimacing, Guarding, Other (Comment) Pain Intervention(s): Monitored during session, Repositioned, Premedicated before session    Home Living                          Prior Function            PT Goals (current goals can now be found in the care plan  section) Acute Rehab PT Goals Patient Stated Goal: to improve pain and mobility PT Goal Formulation: With patient Time For Goal Achievement: 10/04/24 Potential to Achieve Goals: Fair Progress towards PT goals: Progressing toward goals    Frequency    Min 2X/week      PT Plan      Co-evaluation PT/OT/SLP Co-Evaluation/Treatment: Yes Reason for Co-Treatment: For patient/therapist safety;Necessary to address cognition/behavior during functional activity;To address functional/ADL transfers PT goals addressed during session: Mobility/safety with mobility;Balance;Proper use of DME OT goals addressed during session: ADL's and self-care      AM-PAC PT 6 Clicks Mobility   Outcome Measure  Help needed turning from your back to your side while in a flat bed without using bedrails?: A Lot Help needed moving from lying on your back to sitting on the side of a flat bed without using bedrails?: A Lot Help needed moving to and from a bed to a chair (including a wheelchair)?: Total Help needed standing up from a chair using your arms (e.g., wheelchair or bedside chair)?: A Lot Help needed to walk in hospital room?: Total Help needed climbing 3-5 steps with a railing? : Total 6 Click Score: 9    End of Session Equipment Utilized During Treatment: Gait belt Activity Tolerance: Patient limited by pain Patient left: in bed;with call bell/phone within reach;with nursing/sitter in room Nurse Communication: Mobility status PT Visit Diagnosis: Other abnormalities of gait and mobility (R26.89);Muscle weakness (generalized) (M62.81);Pain Pain - Right/Left: Left Pain - part of body: Leg;Hip;Ankle and joints of foot     Time: 0926-0953 PT Time Calculation (min) (ACUTE ONLY): 27 min  Charges:    $Therapeutic Activity: 8-22 mins PT General Charges $$ ACUTE PT VISIT: 1 Visit                     Aminah Zabawa, PT, GCS 09/21/2024,10:03 AM

## 2024-09-21 NOTE — Discharge Instructions (Signed)
 Do you feel isolated?  [facility] offers a [employer] that anyone can call toll free at [phone]. The friendship line is available 24 hours a day  [facility] is a Program of All-inclusive Care for the Elderly (PACE). Their mission is to promote and sustain the independence of seniors wishing to remain in the community. They provide seniors with comprehensive long-term health, social, medical and dietary care. Their program is a safe alternative to nursing home care. [phone]  [facility] Physical Address [facility] [address] [place], [place] [address] Phone: [phone]. . Online zoom yoga class, connect with others without leaving your home [facility] offers [place] dance cardio sessions for individuals via Zoom. This program provides: - Dance fitness activities Please contact program for more information. Servinganyone in need adults 18+ hiv/aids individuals families Call [phone]  Email [email or web address] [email or web address] to get more info  [employer] offers an online [employer] to individuals where they can receive help to focus on their best health. Whether you're a [employer] member or not, the neighborhood center offers a... Main Serviceshealth education  exercise & fitness  community support services  recreation  virtual support Other Servicessupport groups Servinganyone in need adults young adults teens seniors individuals families [email or web address] to get more info  Schedule on their website  [facility] offers an array of activities for adults age 92 and over. This program provides:- Fitness and health programs- Tech classes- Activity books Main Serviceshealth education  community support services  exercise & fitness  recreation  more education Servingseniors  Call [phone]    For more resources go online to [email or web address] and type in you zipcode  Keep  log of sugars at home and discuss with PCP

## 2024-09-21 NOTE — Progress Notes (Signed)
 Progress Note   Patient: Shawn Rivas FMW:[id number] DOB: 04-22-1966 DOA: 09/19/2024     2 DOS: the patient was seen and examined on 09/21/2024   Brief hospital course: Per H&P HPI Shawn Rivas is a 58 y.o. year old male with medical history of HTN, HLD, T2DM presenting to the ED after a fall.    Patient states he fell from standing position as he was trying to reach something but slipped off the chair hurting his left hip. He states he lives alone and needs thinks he may need more help then he previously did.    On arrival to the ED patient was noted to be HDS stable.  Lab work and imaging obtained.  CBC with mild leukocytosis otherwise unremarkable.  BMP with mild hyponatremia and significant hyperglycemia otherwise normal renal function.  Imaging showed acute appearing, mildly displaced periprosthetic fracture of the left greater trochanter.  Given this, EDP discussed with Dr. Welford from orthopedic who recommended that this was to be managed nonoperatively with weightbearing as tolerated.  Given this, TRH contacted for admission.   Review of Systems: As mentioned in the history of present illness. All other systems reviewed and are negative.    Assessment and Plan: Periprosthetic fracture of L greater trochanter repair  Manage nonoperatively per Ortho.  Weightbearing as tolerated.  Pain control.  Patient has wounds on left foot that causes him significant pain with ambulation.  He is supposed to get a cast for his left foot.  T2DM complicated by chronic foot ulcers Uncontrolled A1c 12.7 2 months ago.  Patient is currently not taking his insulin  but is taking metformin .  Increase Lantus  to 40 units Resume metformin  Continue 8 u TID with meals  - Foot ulcers do not appear infected he sees wound care outpatient for this, and wound care consulted here  HLD Not currently on statin  LDL 158 6 months ago.  Will need to discuss this with his PCP on discharge.  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 Encouraged adherence with antidiabetes medication.  On ARB.  Will continue to monitor.   HTN Continue formulary equivalent of patient's home medication.     Subjective: Having some nausea, otherwise no significant issues.  Physical Exam: Vitals:   09/21/24 0413 09/21/24 0500 09/21/24 0823 09/21/24 1631  BP: 139/87  (!) 137/92 (!) 155/80  Pulse: 88  90 (!) 104  Resp: 16  18 18   Temp: 98.2 F (36.8 C)  98.1 F (36.7 C) 97.8 F (36.6 C)  TempSrc: Oral     SpO2: 100%  99% 98%  Weight:  111.9 kg    Height:        Constitutional: In no distress.  Cardiovascular: Normal rate, regular rhythm. No lower extremity edema  Pulmonary: Non labored breathing on room air, no wheezing or rales.   Abdominal: Soft. Non distended and non tender Musculoskeletal: Normal range of motion.     Neurological: Alert and oriented to person, place, and time. Non focal  Skin: Plantar ulcers on left foot, no drainage no surrounding erythema   Data Reviewed:     Latest Ref Rng & Units 09/21/2024    4:32 AM 09/20/2024    5:44 AM 09/19/2024    5:43 PM  BMP  Glucose 70 - 99 mg/dL 719  675  633   BUN 6 - 20 mg/dL 32  32  27   Creatinine 0.61 - 1.24 mg/dL 8.46  8.49  8.87   Sodium 135 -  145 mmol/L 132  134  133   Potassium 3.5 - 5.1 mmol/L 3.9  4.3  4.1   Chloride 98 - 111 mmol/L 99  99  100   CO2 22 - 32 mmol/L 20  22  20    Calcium  8.9 - 10.3 mg/dL 8.8  9.3  9.5       Latest Ref Rng & Units 09/21/2024    4:32 AM 09/20/2024    5:44 AM 09/19/2024    5:43 PM  CBC  WBC 4.0 - 10.5 K/uL 9.2  9.0  12.0   Hemoglobin 13.0 - 17.0 g/dL 86.0  85.0  84.5   Hematocrit 39.0 - 52.0 % 40.0  43.2  43.4   Platelets 150 - 400 K/uL 318  344  348      Family Communication: None at bedside.   Disposition: Status is: Inpatient Remains inpatient appropriate because: management of pain of periprosthetic fracture.   Planned Discharge Destination: Pending PT/OT eval     Time  spent: 35 minutes  Author: Alban Pepper, MD 09/21/2024 6:12 PM  For on call review [email or web address].

## 2024-09-21 NOTE — Plan of Care (Signed)
  Problem: Pain Managment: Goal: General experience of comfort will improve and/or be controlled Outcome: Progressing   Problem: Safety: Goal: Ability to remain free from injury will improve Outcome: Progressing

## 2024-09-21 NOTE — Care Management Important Message (Signed)
 Important Message  Patient Details  Name: Shawn Rivas MRN: [id number] Date of Birth: [birth year]   Important Message Given:  Yes - Medicare IM     [name] W, CMA 09/21/2024, 1:15 PM

## 2024-09-21 NOTE — TOC CM/SW Note (Signed)
 Transition of Care [facility]) - Inpatient Brief Assessment   Patient Details  Name: Shawn Rivas MRN: [id number] Date of Birth: October 20, 1965  [facility]) CM/SW Contact:    Daved JONETTA Hamilton, RN Phone Number: 09/21/2024, 10:42 AM   Clinical Narrative:  This CM met with patient and introduced self. Provider at bedside conducting assessment with patient. Provider mentioned to patient that therapy is recommending rehab, patient verbalized I'm not going to rehab, I'm not doing that, I've had bad experiences in the past at those places. This CM ask patient which facilities he has been to before, patient verbalized he doesn't remember but he isn't going to one. Patient then verbally requested this CM come back later, I'm not in a good place right now, I just threw up everywhere. Can you just come back later? This CM verbalized agreement to come back and see patient at a later time. Brief assessment complete.   TOC will continue to follow.   Transition of Care Asessment: Insurance and Status: Insurance coverage has been reviewed Patient has primary care physician: Yes   Prior level of function:: Independent Prior/Current Home Services: No current home services Social Drivers of Health Review: SDOH reviewed interventions complete Readmission risk has been reviewed: Yes Transition of care needs: transition of care needs identified, TOC will continue to follow

## 2024-09-22 LAB — CBC
HCT: 40.6 % (ref 39.0–52.0)
Hemoglobin: 13.5 g/dL (ref 13.0–17.0)
MCH: 30.1 pg (ref 26.0–34.0)
MCHC: 33.3 g/dL (ref 30.0–36.0)
MCV: 90.6 fL (ref 80.0–100.0)
Platelets: 330 K/uL (ref 150–400)
RBC: 4.48 MIL/uL (ref 4.22–5.81)
RDW: 12.7 % (ref 11.5–15.5)
WBC: 8.9 K/uL (ref 4.0–10.5)
nRBC: 0 % (ref 0.0–0.2)

## 2024-09-22 LAB — BASIC METABOLIC PANEL WITH GFR
Anion gap: 9 (ref 5–15)
BUN: 38 mg/dL — ABNORMAL HIGH (ref 6–20)
CO2: 24 mmol/L (ref 22–32)
Calcium: 9.2 mg/dL (ref 8.9–10.3)
Chloride: 102 mmol/L (ref 98–111)
Creatinine, Ser: 1.63 mg/dL — ABNORMAL HIGH (ref 0.61–1.24)
GFR, Estimated: 49 mL/min — ABNORMAL LOW (ref >60)
Glucose, Bld: 190 mg/dL — ABNORMAL HIGH (ref 70–99)
Potassium: 3.8 mmol/L (ref 3.5–5.1)
Sodium: 135 mmol/L (ref 135–145)

## 2024-09-22 LAB — GLUCOSE, CAPILLARY
Glucose-Capillary: 188 mg/dL — ABNORMAL HIGH (ref 70–99)
Glucose-Capillary: 209 mg/dL — ABNORMAL HIGH (ref 70–99)
Glucose-Capillary: 117 mg/dL — ABNORMAL HIGH (ref 70–99)
Glucose-Capillary: 135 mg/dL — ABNORMAL HIGH (ref 70–99)

## 2024-09-22 MED ORDER — TIZANIDINE HCL 4 MG PO TABS
4.0000 mg | ORAL_TABLET | Freq: Once | ORAL | Status: AC
  Administered 2024-09-22: 4 mg via ORAL
  Filled 2024-09-22: qty 1

## 2024-09-22 MED ORDER — AMLODIPINE BESYLATE 5 MG PO TABS
5.0000 mg | ORAL_TABLET | Freq: Every day | ORAL | Status: DC
  Administered 2024-09-23 – 2024-09-24 (×2): 5 mg via ORAL
  Filled 2024-09-22 (×2): qty 1

## 2024-09-22 NOTE — Progress Notes (Signed)
 Patient refused wound care, stated he'd rather have the wound care nurse do it.

## 2024-09-22 NOTE — Hospital Course (Addendum)
 Need to call  Mandie at [phone]  Opioid itching helped.

## 2024-09-22 NOTE — Progress Notes (Addendum)
 Occupational Therapy Treatment Patient Details Name: Shawn Rivas MRN: [id number] DOB: Sep 24, 1966 Today's Date: 09/22/2024   History of present illness Shawn Rivas is a 58 y.o. year old male with medical history of TBI, HTN, HLD, T2DM presenting to the ED after a fall. Sustained a L periprosthetic fx, non surgical.   OT comments  Pt seen for OT and PT co-tx to optimize safety and ADL mobility. Pt required MOD A for sup>sit, heavy assist and time/effort to scoot anteriorly to EOB 2/2 L hip pain. Pt easily distracted and easily overwhelmed with multitasking or multiple people in the room but able to voice concerns. PRN VC to redirect to task. VC for hand placement to stand with RW. MAX A for donning socks seated EOB. Issues with IV which RN came to address. Pt provided with yellow theraband to support BUE exercises in between therapy sessions. Will continue to progress to maximize safety/indep.       If plan is discharge home, recommend the following:  Two people to help with walking and/or transfers;A lot of help with bathing/dressing/bathroom;Assist for transportation;Help with stairs or ramp for entrance   Equipment Recommendations  Other (comment) (defer)    Recommendations for Other Services      Precautions / Restrictions Precautions Precautions: Fall Recall of Precautions/Restrictions: Intact Precaution/Restrictions Comments: no side stepping Restrictions Weight Bearing Restrictions Per Provider Order: Yes LLE Weight Bearing Per Provider Order: Weight bearing as tolerated Other Position/Activity Restrictions: patient also has wound on the bottom of his left foot which limits his ability to put weight on that leg.       Mobility Bed Mobility Overal bed mobility: Needs Assistance Bed Mobility: Supine to Sit     Supine to sit: Mod assist, Used rails, HOB elevated     General bed mobility comments: pain limited, increased time needed    Transfers Overall transfer  level: Needs assistance Equipment used: Rolling walker (2 wheels) Transfers: Sit to/from Stand, Bed to chair/wheelchair/BSC Sit to Stand: Mod assist, +2 physical assistance, From elevated surface Stand pivot transfers: Min assist, +2 physical assistance, From elevated surface         General transfer comment: able to pivot in standing to his right side with min +2 assist and RW     Balance Overall balance assessment: Needs assistance Sitting-balance support: Feet supported Sitting balance-Leahy Scale: Good     Standing balance support: Bilateral upper extremity supported, During functional activity, Reliant on assistive device for balance Standing balance-Leahy Scale: Fair                             ADL either performed or assessed with clinical judgement   ADL Overall ADL's : Needs assistance/impaired     Grooming: Sitting;Set up               Lower Body Dressing: Maximal assistance;Sitting/lateral leans Lower Body Dressing Details (indicate cue type and reason): don socks Toilet Transfer: Minimal assistance;Moderate assistance;Rolling walker (2 wheels);+2 for physical assistance Toilet Transfer Details (indicate cue type and reason): simulated to recliner                Extremity/Trunk Assessment              Vision       Perception     Praxis     Communication Communication Communication: Impaired Factors Affecting Communication: Difficulty expressing self   Cognition Arousal: Alert Behavior During Therapy: Anxious  OT - Cognition Comments: hx TBI, impaired attention requiring VC to redirect, easily overwhelmed with multitasking and multiple people in the room                 Following commands: Impaired Following commands impaired: Follows one step commands inconsistently, Follows one step commands with increased time      Cueing   Cueing Techniques: Verbal cues, Gestural cues  Exercises       Shoulder Instructions       General Comments      Pertinent Vitals/ Pain       Pain Assessment Pain Assessment: 0-10 Pain Score: 10-Worst pain ever Faces Pain Scale: Hurts even more Pain Location: L hip with movement Pain Descriptors / Indicators: Discomfort, Grimacing, Guarding Pain Intervention(s): Monitored during session, Repositioned, Premedicated before session  Home Living                                          Prior Functioning/Environment              Frequency  Min 2X/week        Progress Toward Goals  OT Goals(current goals can now be found in the care plan section)  Progress towards OT goals: Progressing toward goals  Acute Rehab OT Goals Patient Stated Goal: go home OT Goal Formulation: With patient Time For Goal Achievement: 10/04/24 Potential to Achieve Goals: Fair  Plan      Co-evaluation    PT/OT/SLP Co-Evaluation/Treatment: Yes Reason for Co-Treatment: For patient/therapist safety;Necessary to address cognition/behavior during functional activity;To address functional/ADL transfers PT goals addressed during session: Mobility/safety with mobility;Balance;Proper use of DME OT goals addressed during session: ADL's and self-care      AM-PAC OT 6 Clicks Daily Activity     Outcome Measure   Help from another person eating meals?: None Help from another person taking care of personal grooming?: None Help from another person toileting, which includes using toliet, bedpan, or urinal?: Total Help from another person bathing (including washing, rinsing, drying)?: A Lot Help from another person to put on and taking off regular upper body clothing?: A Lot Help from another person to put on and taking off regular lower body clothing?: A Lot 6 Click Score: 15    End of Session Equipment Utilized During Treatment: Gait belt;Rolling walker (2 wheels)  OT Visit Diagnosis: Unsteadiness on feet (R26.81);Muscle weakness  (generalized) (M62.81);History of falling (Z91.81)   Activity Tolerance Patient limited by pain   Patient Left in chair;with call bell/phone within reach;with nursing/sitter in room   Nurse Communication Other (comment);Patient requests pain meds (IV issues)        Time: 8953-8875 OT Time Calculation (min): 38 min  Charges: OT General Charges $OT Visit: 1 Visit OT Treatments $Therapeutic Activity: 8-22 mins  Warren SAUNDERS., MPH, MS, OTR/L ascom [phone] 09/22/2024, 2:23 PM

## 2024-09-22 NOTE — Inpatient Diabetes Management (Addendum)
 Inpatient Diabetes Program Recommendations  AACE/ADA: New Consensus Statement on Inpatient Glycemic Control   Target Ranges:  Prepandial:   less than 140 mg/dL      Peak postprandial:   less than 180 mg/dL (1-2 hours)      Critically ill patients:  140 - 180 mg/dL    Latest Reference Range & Units 09/21/24 08:25 09/21/24 11:55 09/21/24 16:28 09/21/24 21:28 09/22/24 08:39  Glucose-Capillary 70 - 99 mg/dL 736 (H) 705 (H) 786 (H) 131 (H) 188 (H)    Latest Reference Range & Units 03/14/24 15:41 07/03/24 15:16  Hemoglobin A1C 4.0 - 5.6 % >14.0 (H) 12.7 !   Review of Glycemic Control  Diabetes history: DM2 Outpatient Diabetes medications: Metformin  1500 mg daily, Tresiba  30 units daily (prescribed 40 units but was rationing insulin ; ran out several weeks ago and can not afford to get filled) Current orders for Inpatient glycemic control: Lantus  40 units daily, Novolog  8 units TID with meals, Novolog  0-15 units TID with meals, Novolog  0-5 units at bedtime, Metformin  XR 1500 mg QPM  Inpatient Diabetes Program Recommendations:    Discharge Recommendations: Long acting recommendations: Insulin  Glargine (LANTUS ) Solostar Pen 40 units daily  Short acting recommendations:  Meal + Correction coverage Insulin  lispro (HUMALOG ) KwikPen  Moderate Scale.  10 units TID with meals plus correction Supply/Referral recommendations: Pen needles - standard   Use Adult Diabetes Insulin  Treatment Post Discharge order set.  Thanks, Earnie Gainer, RN, MSN, CDCES Diabetes Coordinator Inpatient Diabetes Program [phone] (Team Pager from 8am to 5pm)

## 2024-09-22 NOTE — Progress Notes (Signed)
 Progress Note   Patient: Shawn Rivas FMW:[id number] DOB: Jul 06, 1966 DOA: 09/19/2024     3 DOS: the patient was seen and examined on 09/22/2024   Brief hospital course: Per H&P HPI Shawn Rivas is a 58 y.o. year old male with medical history of HTN, HLD, T2DM presenting to the ED after a fall.    Patient states he fell from standing position as he was trying to reach something but slipped off the chair hurting his left hip. He states he lives alone and needs thinks he may need more help then he previously did.    On arrival to the ED patient was noted to be HDS stable.  Lab work and imaging obtained.  CBC with mild leukocytosis otherwise unremarkable.  BMP with mild hyponatremia and significant hyperglycemia otherwise normal renal function.  Imaging showed acute appearing, mildly displaced periprosthetic fracture of the left greater trochanter.  Given this, EDP discussed with Dr. Welford from orthopedic who recommended that this was to be managed nonoperatively with weightbearing as tolerated.  Given this, [facility] contacted for admission.   Review of Systems: As mentioned in the history of present illness. All other systems reviewed and are negative.    Assessment and Plan: Periprosthetic fracture of L greater trochanter repair  Manage nonoperatively per Ortho.  Weightbearing as tolerated.  Pain control.  Patient has wounds on left foot that causes him significant pain with ambulation.  He is supposed to get a cast or boot for the left foot to help manage pain.  He does note that taking pain medications prior to PT helps him be more participatory.  Plan for SNF.  T2DM complicated by chronic foot ulcers Uncontrolled A1c 12.7 2 months ago.  Patient is currently not taking his insulin  but is taking metformin .  Continue Lantus  to 40 units Continue metformin  Continue 8 u TID with meals  CBG (last 3)  Recent Labs    09/22/24 0839 09/22/24 1157 09/22/24 1720  GLUCAP 188* 209* 117*    -  Foot ulcers do not appear infected he sees wound care outpatient for this, and wound care consulted here  HLD Not currently on statin  LDL 158 6 months ago.  Will need to discuss this with his PCP on discharge.  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 Encouraged adherence with antidiabetes medication.  On ARB.  Will hold ARB given worsening serum creatinine - Continue to monitor BMP  HTN Hold patient's ARB given worsening serum creatinine     Subjective: Having some nausea, otherwise no significant issues.  Physical Exam: Vitals:   09/21/24 1932 09/22/24 0450 09/22/24 0843 09/22/24 1659  BP: (!) 144/98 128/75 125/81 130/72  Pulse: (!) 104  84 (!) 105  Resp: 18 17 17 18   Temp: 98.1 F (36.7 C)     TempSrc: Oral     SpO2: 96% 97% (!) 89% 100%  Weight:      Height:         Constitutional: In no distress.  Cardiovascular: Normal rate, regular rhythm.  Pulmonary: Non labored breathing on room air, no wheezing or rales.   Abdominal: Soft. Non distended and non tender Musculoskeletal: Trace Bilateral Edema, LLE larger than RLE this is chronic after accident.     Neurological: Alert and oriented to person, place, and time. Non focal  Skin: Plantar ulcer on left foot bandage, left lateral malleolus ulcer bandage  Data Reviewed:     Latest Ref Rng & Units 09/22/2024    6:42  AM 09/21/2024    4:32 AM 09/20/2024    5:44 AM  BMP  Glucose 70 - 99 mg/dL 809  719  675   BUN 6 - 20 mg/dL 38  32  32   Creatinine 0.61 - 1.24 mg/dL 8.36  8.46  8.49   Sodium 135 - 145 mmol/L 135  132  134   Potassium 3.5 - 5.1 mmol/L 3.8  3.9  4.3   Chloride 98 - 111 mmol/L 102  99  99   CO2 22 - 32 mmol/L 24  20  22    Calcium  8.9 - 10.3 mg/dL 9.2  8.8  9.3       Latest Ref Rng & Units 09/22/2024    6:42 AM 09/21/2024    4:32 AM 09/20/2024    5:44 AM  CBC  WBC 4.0 - 10.5 K/uL 8.9  9.2  9.0   Hemoglobin 13.0 - 17.0 g/dL 86.4  86.0  85.0   Hematocrit 39.0 - 52.0 % 40.6  40.0  43.2    Platelets 150 - 400 K/uL 330  318  344      Family Communication: None at bedside.   Disposition: Status is: Inpatient Remains inpatient appropriate because: management of pain of periprosthetic fracture.   Planned Discharge Destination: Pending PT/OT eval     Time spent: 35 minutes  Author: Alban Pepper, MD 09/22/2024 5:38 PM  For on call review [email or web address].

## 2024-09-22 NOTE — Plan of Care (Signed)
  Problem: Education: Goal: Ability to describe self-care measures that may prevent or decrease complications (Diabetes Survival Skills Education) will improve Outcome: Progressing   Problem: Coping: Goal: Ability to adjust to condition or change in health will improve Outcome: Progressing

## 2024-09-22 NOTE — Progress Notes (Signed)
 Physical Therapy Treatment Patient Details Name: Shawn Rivas MRN: [id number] DOB: Jan 24, 1966 Today's Date: 09/22/2024   History of Present Illness Shawn Rivas is a 58 y.o. year old male with medical history of TBI, HTN, HLD, T2DM presenting to the ED after a fall. Sustained a L periprosthetic fx, non surgical.    PT Comments  Patient received in bed, he is anxious, agreeable to PT session. Wants to get up to recliner today. Patient continues to be limited significantly. Requires increased time for processing and due to pain. Patient requires mod A for bed mobility and mod +2 for sit to stand and to pivot to recliner. Patient will continue to benefit from skilled PT to improve mobility and independence.     If plan is discharge home, recommend the following: Two people to help with walking and/or transfers;A lot of help with bathing/dressing/bathroom;Assist for transportation   Can travel by private vehicle     No  Equipment Recommendations  Wheelchair cushion (measurements PT);Wheelchair (measurements PT)    Recommendations for Other Services       Precautions / Restrictions Precautions Precautions: Fall Recall of Precautions/Restrictions: Intact Precaution/Restrictions Comments: no side stepping Restrictions Weight Bearing Restrictions Per Provider Order: Yes LLE Weight Bearing Per Provider Order: Weight bearing as tolerated Other Position/Activity Restrictions: patient also has wound on the bottom of his left foot which limits his ability to put weight on that leg.     Mobility  Bed Mobility Overal bed mobility: Needs Assistance Bed Mobility: Supine to Sit     Supine to sit: Mod assist, Used rails, HOB elevated     General bed mobility comments: pain limited, increased time needed    Transfers Overall transfer level: Needs assistance Equipment used: Rolling walker (2 wheels) Transfers: Sit to/from Stand, Bed to chair/wheelchair/BSC Sit to Stand: Mod assist,  +2 physical assistance, From elevated surface Stand pivot transfers: Min assist, +2 physical assistance, From elevated surface         General transfer comment: able to pivot in standing to his right side with min +2 assist and RW    Ambulation/Gait               General Gait Details: unable   Stairs             Wheelchair Mobility     Tilt Bed    Modified Rankin (Stroke Patients Only)       Balance Overall balance assessment: Needs assistance Sitting-balance support: Feet supported Sitting balance-Leahy Scale: Good     Standing balance support: Bilateral upper extremity supported, During functional activity, Reliant on assistive device for balance Standing balance-Leahy Scale: Fair                              Hotel Manager: Impaired Factors Affecting Communication: Difficulty expressing self  Cognition Arousal: Alert Behavior During Therapy: Anxious   PT - Cognitive impairments: Problem solving, Safety/Judgement, Attention, Sequencing                       PT - Cognition Comments: TBI history Following commands: Impaired Following commands impaired: Follows one step commands inconsistently, Follows one step commands with increased time    Cueing Cueing Techniques: Verbal cues, Gestural cues  Exercises      General Comments        Pertinent Vitals/Pain Pain Assessment Pain Assessment: 0-10 Pain Score: 10-Worst pain ever Pain Location:  L hip with movement Pain Descriptors / Indicators: Discomfort, Grimacing, Guarding Pain Intervention(s): Premedicated before session, Repositioned    Home Living                          Prior Function            PT Goals (current goals can now be found in the care plan section) Acute Rehab PT Goals Patient Stated Goal: to improve pain and mobility PT Goal Formulation: With patient Time For Goal Achievement: 10/04/24 Potential to Achieve  Goals: Fair Progress towards PT goals: Progressing toward goals    Frequency    Min 2X/week      PT Plan      Co-evaluation PT/OT/SLP Co-Evaluation/Treatment: Yes Reason for Co-Treatment: For patient/therapist safety;Necessary to address cognition/behavior during functional activity;To address functional/ADL transfers PT goals addressed during session: Mobility/safety with mobility;Balance;Proper use of DME OT goals addressed during session: ADL's and self-care      AM-PAC PT 6 Clicks Mobility   Outcome Measure  Help needed turning from your back to your side while in a flat bed without using bedrails?: A Lot Help needed moving from lying on your back to sitting on the side of a flat bed without using bedrails?: A Lot Help needed moving to and from a bed to a chair (including a wheelchair)?: A Lot Help needed standing up from a chair using your arms (e.g., wheelchair or bedside chair)?: A Lot Help needed to walk in hospital room?: Total Help needed climbing 3-5 steps with a railing? : Total 6 Click Score: 10    End of Session Equipment Utilized During Treatment: Gait belt Activity Tolerance: Patient limited by pain Patient left: in chair;with call bell/phone within reach;Other (comment) (chair alarm in recliner, no box to hook it up to. RN notified) Nurse Communication: Mobility status PT Visit Diagnosis: Other abnormalities of gait and mobility (R26.89);Muscle weakness (generalized) (M62.81);Pain;Difficulty in walking, not elsewhere classified (R26.2);History of falling (Z91.81) Pain - Right/Left: Left Pain - part of body: Leg;Ankle and joints of foot     Time: 8954-8874 PT Time Calculation (min) (ACUTE ONLY): 40 min  Charges:    $Therapeutic Activity: 23-37 mins PT General Charges $$ ACUTE PT VISIT: 1 Visit                     Andretta Ergle, PT, GCS 09/22/2024,11:37 AM

## 2024-09-22 NOTE — TOC Progression Note (Signed)
 Transition of Care [facility]) - Progression Note    Patient Details  Name: Shawn Rivas MRN: [id number] Date of Birth: 1966-02-14  Transition of Care [facility] CM/SW Contact  Nathanael CHRISTELLA Ring, RN Phone Number: 09/22/2024, 4:22 PM  Clinical Narrative:     Met with patient at the bedside this afternoon, introduced self and explained role in DC planning.  CM attempted to to speak with him this morning but he was too overwhelmed trying to work with PT and OT and his IV was bleeding.   This afternoon he is more relaxed.  He is lying in bed, alert, willing to talk.  He reports that he will not go to a SNF he has had too many bad experiences in SNF.  He makes sure to say that he cannot go home because he can't even get into his mobile home he would need an ambulance and he lives alone and wouldn't be able to get into the bathroom.  He also says he hasn't gotten any therapy here at the hospital. CM explains to him that the hospital is not designed to rehab him but to evaluate him and that we can arrange EMS transport home and set up [facility].  He already has [facility] for nursing, he was not too happy with there PT and SW but loves his nurse so would not want to jeopardize losing her.   I did tell him to look at the reviews for [facility] and rehab and [facility] to see what he thinks, he says that he will.    Expected Discharge Plan: Home w Home Health Services Barriers to Discharge: Continued Medical Work up               Expected Discharge Plan and Services   Discharge Planning Services: CM Consult   Living arrangements for the past 2 months: Mobile Home                                       Social Drivers of Health (SDOH) Interventions SDOH Screenings   Food Insecurity: No Food Insecurity (09/19/2024)  Housing: Low Risk (09/19/2024)  Transportation Needs: No Transportation Needs (09/19/2024)  Utilities: Not At Risk (09/19/2024)  Depression (PHQ2-9): Low Risk (05/15/2024)  Financial  Resource Strain: Low Risk  (06/29/2024)   Received from [facility]  Physical Activity: Inactive (03/21/2024)  Social Connections: Moderately Isolated (09/19/2024)  Stress: Stress Concern Present (03/21/2024)  Tobacco Use: Medium Risk (09/19/2024)  Health Literacy: Adequate Health Literacy (03/21/2024)    Readmission Risk Interventions     No data to display

## 2024-09-23 LAB — CBC
HCT: 40.7 % (ref 39.0–52.0)
Hemoglobin: 13.8 g/dL (ref 13.0–17.0)
MCH: 30.2 pg (ref 26.0–34.0)
MCHC: 33.9 g/dL (ref 30.0–36.0)
MCV: 89.1 fL (ref 80.0–100.0)
Platelets: 324 K/uL (ref 150–400)
RBC: 4.57 MIL/uL (ref 4.22–5.81)
RDW: 12.6 % (ref 11.5–15.5)
WBC: 8.9 K/uL (ref 4.0–10.5)
nRBC: 0 % (ref 0.0–0.2)

## 2024-09-23 LAB — BASIC METABOLIC PANEL WITH GFR
Anion gap: 12 (ref 5–15)
BUN: 46 mg/dL — ABNORMAL HIGH (ref 6–20)
CO2: 20 mmol/L — ABNORMAL LOW (ref 22–32)
Calcium: 8.8 mg/dL — ABNORMAL LOW (ref 8.9–10.3)
Chloride: 100 mmol/L (ref 98–111)
Creatinine, Ser: 1.65 mg/dL — ABNORMAL HIGH (ref 0.61–1.24)
GFR, Estimated: 48 mL/min — ABNORMAL LOW (ref >60)
Glucose, Bld: 191 mg/dL — ABNORMAL HIGH (ref 70–99)
Potassium: 3.9 mmol/L (ref 3.5–5.1)
Sodium: 132 mmol/L — ABNORMAL LOW (ref 135–145)

## 2024-09-23 LAB — GLUCOSE, CAPILLARY
Glucose-Capillary: 162 mg/dL — ABNORMAL HIGH (ref 70–99)
Glucose-Capillary: 93 mg/dL (ref 70–99)
Glucose-Capillary: 201 mg/dL — ABNORMAL HIGH (ref 70–99)
Glucose-Capillary: 179 mg/dL — ABNORMAL HIGH (ref 70–99)

## 2024-09-23 MED ORDER — POLYETHYLENE GLYCOL 3350 17 G PO PACK
17.0000 g | PACK | Freq: Every day | ORAL | Status: DC
  Filled 2024-09-23 (×5): qty 1

## 2024-09-23 MED ORDER — SENNOSIDES-DOCUSATE SODIUM 8.6-50 MG PO TABS
1.0000 | ORAL_TABLET | Freq: Two times a day (BID) | ORAL | Status: DC
  Administered 2024-09-26 – 2024-09-29 (×4): 1 via ORAL
  Filled 2024-09-23 (×10): qty 1

## 2024-09-23 MED ORDER — INSULIN GLARGINE 100 UNIT/ML ~~LOC~~ SOLN
45.0000 [IU] | Freq: Every day | SUBCUTANEOUS | Status: DC
  Administered 2024-09-23 – 2024-09-26 (×4): 45 [IU] via SUBCUTANEOUS
  Filled 2024-09-23 (×6): qty 0.45

## 2024-09-23 MED ORDER — ALUM & MAG HYDROXIDE-SIMETH 200-200-20 MG/5ML PO SUSP
30.0000 mL | ORAL | Status: DC | PRN
  Administered 2024-09-23 – 2024-09-29 (×4): 30 mL via ORAL
  Filled 2024-09-23 (×4): qty 30

## 2024-09-23 NOTE — Progress Notes (Signed)
 Physical Therapy Treatment Patient Details Name: Shawn Rivas MRN: [id number] DOB: 09/13/1966 Today's Date: 09/23/2024   History of Present Illness Shawn Rivas is a 58 y.o. year old male with medical history of TBI, HTN, HLD, T2DM presenting to the ED after a fall. Sustained a L periprosthetic fx, non surgical.    PT Comments  Pt insists on LLE NWB  (2/2 wound in L foot) and requires Mod A +2 for transfers with RW.  Progressed transfers to transitioning to L and R sides on/off commode with RW. Pt continues to experience limitations to mobility, demonstrating decreased activity tolerance, general weakness, LLE pain, self limiting (seeming to be very anxious about mobility and argues about sequencing and weight bearing status).  Continue to recommend post acute rehab <3 hours therapy/day upon d/c.      If plan is discharge home, recommend the following: Two people to help with walking and/or transfers;A lot of help with bathing/dressing/bathroom;Assist for transportation   Can travel by private vehicle     No  Equipment Recommendations  Wheelchair cushion (measurements PT);Other (comment);Wheelchair (measurements PT) (TBD at next venue of care)    Recommendations for Other Services       Precautions / Restrictions Precautions Precautions: Fall Recall of Precautions/Restrictions: Intact Precaution/Restrictions Comments: no side stepping Restrictions Weight Bearing Restrictions Per Provider Order: Yes LLE Weight Bearing Per Provider Order: Weight bearing as tolerated Other Position/Activity Restrictions: patient also has wound on the bottom of his left foot which limits his ability to put weight on that leg.     Mobility  Bed Mobility Overal bed mobility: Needs Assistance Bed Mobility: Supine to Sit     Supine to sit: Mod assist, Used rails, HOB elevated Sit to supine: Mod assist, +2 for physical assistance   General bed mobility comments: pain limited, increased time  needed    Transfers Overall transfer level: Needs assistance Equipment used: Rolling walker (2 wheels) Transfers: Sit to/from Stand, Bed to chair/wheelchair/BSC Sit to Stand: Mod assist, +2 physical assistance, From elevated surface Stand pivot transfers: Min assist, +2 physical assistance, From elevated surface         General transfer comment: able to pivot in standing to his right and Left side with min +2 assist and RW    Ambulation/Gait               General Gait Details: unable   Stairs             Wheelchair Mobility     Tilt Bed    Modified Rankin (Stroke Patients Only)       Balance Overall balance assessment: Needs assistance Sitting-balance support: Feet supported Sitting balance-Leahy Scale: Good Sitting balance - Comments: able to get all the way to edge of bed with increased time due to pain   Standing balance support: Bilateral upper extremity supported, During functional activity, Reliant on assistive device for balance Standing balance-Leahy Scale: Fair                              Hotel Manager: Impaired Factors Affecting Communication: Difficulty expressing self  Cognition Arousal: Alert Behavior During Therapy: Anxious   PT - Cognitive impairments: Problem solving, Safety/Judgement, Attention, Sequencing                       PT - Cognition Comments: TBI history Following commands: Impaired Following commands impaired: Follows one step commands  inconsistently, Follows one step commands with increased time    Cueing Cueing Techniques: Verbal cues, Gestural cues  Exercises      General Comments        Pertinent Vitals/Pain Pain Assessment Faces Pain Scale: Hurts whole lot Pain Location: L hip with movement Pain Descriptors / Indicators: Discomfort, Grimacing, Guarding Pain Intervention(s): Premedicated before session, Monitored during session, Limited activity within  patient's tolerance    Home Living                          Prior Function            PT Goals (current goals can now be found in the care plan section) Acute Rehab PT Goals Patient Stated Goal: to improve pain and mobility PT Goal Formulation: With patient Time For Goal Achievement: 10/04/24 Potential to Achieve Goals: Fair Progress towards PT goals: Progressing toward goals    Frequency    Min 2X/week      PT Plan      Co-evaluation              AM-PAC PT 6 Clicks Mobility   Outcome Measure  Help needed turning from your back to your side while in a flat bed without using bedrails?: A Lot Help needed moving from lying on your back to sitting on the side of a flat bed without using bedrails?: A Lot Help needed moving to and from a bed to a chair (including a wheelchair)?: Total Help needed standing up from a chair using your arms (e.g., wheelchair or bedside chair)?: Total Help needed to walk in hospital room?: Total Help needed climbing 3-5 steps with a railing? : Total 6 Click Score: 8    End of Session Equipment Utilized During Treatment: Gait belt Activity Tolerance: Patient limited by pain Patient left: in bed;with call bell/phone within reach Nurse Communication: Mobility status PT Visit Diagnosis: Other abnormalities of gait and mobility (R26.89);Muscle weakness (generalized) (M62.81);Pain;Difficulty in walking, not elsewhere classified (R26.2);History of falling (Z91.81) Pain - Right/Left: Left Pain - part of body: Leg;Ankle and joints of foot     Time: 8879-8784 PT Time Calculation (min) (ACUTE ONLY): 55 min  Charges:    $Therapeutic Activity: 53-67 mins PT General Charges $$ ACUTE PT VISIT: 1 Visit                    Harland Irving, PTA  09/23/2024, 12:34 PM

## 2024-09-23 NOTE — Progress Notes (Signed)
 Attempted Lt foot dressing change. Pt refused dressing change. Pt states he would like to wait for wound care on Monday.

## 2024-09-23 NOTE — Plan of Care (Signed)

## 2024-09-23 NOTE — Progress Notes (Addendum)
 Pt refused 1200 Novolog  8 units d/t CBD being 93. Pt rec'd Lantus  45 units at 1047. He does not meet parameters for sliding scale 1200 noon dosage. He also requested two (4) oz containers of OJ. Provider informed.

## 2024-09-23 NOTE — Progress Notes (Signed)
 Progress Note   Patient: Shawn Rivas FMW:[id number] DOB: July 22, 1966 DOA: 09/19/2024     4 DOS: the patient was seen and examined on 09/23/2024   Brief hospital course: Per H&P HPI Shawn Rivas is a 58 y.o. year old male with medical history of HTN, HLD, T2DM presenting to the ED after a fall.    Patient states he fell from standing position as he was trying to reach something but slipped off the chair hurting his left hip. He states he lives alone and needs thinks he may need more help then he previously did.    On arrival to the ED patient was noted to be HDS stable.  Lab work and imaging obtained.  CBC with mild leukocytosis otherwise unremarkable.  BMP with mild hyponatremia and significant hyperglycemia otherwise normal renal function.  Imaging showed acute appearing, mildly displaced periprosthetic fracture of the left greater trochanter.  Given this, EDP discussed with Dr. Welford from orthopedic who recommended that this was to be managed nonoperatively with weightbearing as tolerated.  Given this, [facility] contacted for admission.   Review of Systems: As mentioned in the history of present illness. All other systems reviewed and are negative.    Assessment and Plan: Periprosthetic fracture of L greater trochanter repair  Manage nonoperatively per Ortho.  Weightbearing as tolerated.  Pain control.  Patient has wounds on left foot that causes him significant pain with ambulation.  He is supposed to get a cast or boot for the left foot to help manage pain.  He does note that taking pain medications prior to PT helps him be more participatory.  Plan for SNF.  T2DM complicated by chronic foot ulcers Uncontrolled A1c 12.7 2 months ago.  Patient is currently not taking his insulin  but is taking metformin .  Blood sugars are better controlled.  His a.m. blood sugar was 190 on BMP. Increase Lantus  to 45 units daily Continue metformin  Continue 8 u TID with meals  CBG (last 3)  Recent Labs     09/22/24 2108 09/23/24 0842 09/23/24 1204  GLUCAP 135* 162* 93   - Foot ulcers do not appear infected he sees wound care outpatient for this, and wound care consulted here  HLD Not currently on statin  LDL 158 6 months ago.  Will need to discuss this with his PCP on discharge.  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 Encouraged adherence with antidiabetes medication.  On ARB.  Will continue to hold ARB, serum creatinine stable this a.m. - Continue to monitor BMP  HTN Hold patient's ARB given worsening serum creatinine Continue amlodipine      Subjective: No significant issues overnight.  Physical Exam: Vitals:   09/22/24 1659 09/22/24 2028 09/23/24 0500 09/23/24 0821  BP: 130/72 137/79 130/68 (!) 130/92  Pulse: (!) 105 (!) 106 99 88  Resp: 18 18 18 19   Temp:  98.5 F (36.9 C) 98.2 F (36.8 C) 98.4 F (36.9 C)  TempSrc:      SpO2: 100% 99% 100% 99%  Weight:      Height:         Constitutional: In no distress.  Cardiovascular: Normal rate, regular rhythm.  Pulmonary: Non labored breathing on room air, no wheezing or rales.   Abdominal: Soft. Non distended and non tender Musculoskeletal: Trace Bilateral Edema, LLE larger than RLE this is chronic after accident.     Neurological: Alert and oriented to person, place, and time. Non focal  Skin: Plantar ulcer on left foot bandage, left  lateral malleolus ulcer bandage  Data Reviewed:     Latest Ref Rng & Units 09/23/2024    5:23 AM 09/22/2024    6:42 AM 09/21/2024    4:32 AM  BMP  Glucose 70 - 99 mg/dL 808  809  719   BUN 6 - 20 mg/dL 46  38  32   Creatinine 0.61 - 1.24 mg/dL 8.34  8.36  8.46   Sodium 135 - 145 mmol/L 132  135  132   Potassium 3.5 - 5.1 mmol/L 3.9  3.8  3.9   Chloride 98 - 111 mmol/L 100  102  99   CO2 22 - 32 mmol/L 20  24  20    Calcium  8.9 - 10.3 mg/dL 8.8  9.2  8.8       Latest Ref Rng & Units 09/23/2024    5:23 AM 09/22/2024    6:42 AM 09/21/2024    4:32 AM  CBC  WBC 4.0  - 10.5 K/uL 8.9  8.9  9.2   Hemoglobin 13.0 - 17.0 g/dL 86.1  86.4  86.0   Hematocrit 39.0 - 52.0 % 40.7  40.6  40.0   Platelets 150 - 400 K/uL 324  330  318      Family Communication: None at bedside.   Disposition: Status is: Inpatient Remains inpatient appropriate because: management of pain of periprosthetic fracture.   Planned Discharge Destination: Pending PT/OT eval     Time spent: 35 minutes  Author: Alban Pepper, MD 09/23/2024 2:52 PM  For on call review [email or web address].

## 2024-09-24 DIAGNOSIS — M978XXD Periprosthetic fracture around other internal prosthetic joint, subsequent encounter: Secondary | ICD-10-CM | POA: Diagnosis not present

## 2024-09-24 DIAGNOSIS — Z96649 Presence of unspecified artificial hip joint: Secondary | ICD-10-CM | POA: Diagnosis not present

## 2024-09-24 LAB — BASIC METABOLIC PANEL WITH GFR
Anion gap: 14 (ref 5–15)
BUN: 44 mg/dL — ABNORMAL HIGH (ref 6–20)
CO2: 18 mmol/L — ABNORMAL LOW (ref 22–32)
Calcium: 9 mg/dL (ref 8.9–10.3)
Chloride: 102 mmol/L (ref 98–111)
Creatinine, Ser: 1.49 mg/dL — ABNORMAL HIGH (ref 0.61–1.24)
GFR, Estimated: 54 mL/min — ABNORMAL LOW (ref >60)
Glucose, Bld: 194 mg/dL — ABNORMAL HIGH (ref 70–99)
Potassium: 4.4 mmol/L (ref 3.5–5.1)
Sodium: 134 mmol/L — ABNORMAL LOW (ref 135–145)

## 2024-09-24 LAB — GLUCOSE, CAPILLARY
Glucose-Capillary: 196 mg/dL — ABNORMAL HIGH (ref 70–99)
Glucose-Capillary: 180 mg/dL — ABNORMAL HIGH (ref 70–99)
Glucose-Capillary: 118 mg/dL — ABNORMAL HIGH (ref 70–99)
Glucose-Capillary: 96 mg/dL (ref 70–99)

## 2024-09-24 MED ORDER — INSULIN ASPART 100 UNIT/ML IJ SOLN
12.0000 [IU] | Freq: Three times a day (TID) | INTRAMUSCULAR | Status: DC
  Administered 2024-09-24 – 2024-09-26 (×4): 12 [IU] via SUBCUTANEOUS
  Filled 2024-09-24 (×5): qty 12

## 2024-09-24 MED ORDER — OXYCODONE HCL 5 MG PO TABS
5.0000 mg | ORAL_TABLET | ORAL | Status: DC | PRN
  Administered 2024-09-25 – 2024-09-30 (×3): 5 mg via ORAL
  Filled 2024-09-24 (×3): qty 1

## 2024-09-24 MED ORDER — ALPRAZOLAM 0.5 MG PO TABS
0.5000 mg | ORAL_TABLET | Freq: Two times a day (BID) | ORAL | Status: DC | PRN
  Administered 2024-09-24 – 2024-10-01 (×8): 0.5 mg via ORAL
  Filled 2024-09-24 (×9): qty 1

## 2024-09-24 MED ORDER — MORPHINE SULFATE (PF) 2 MG/ML IV SOLN: 2.0000 mg | INTRAVENOUS | Status: DC | PRN

## 2024-09-24 MED ORDER — LACTULOSE 10 GM/15ML PO SOLN
20.0000 g | Freq: Once | ORAL | Status: AC
  Administered 2024-09-30: 20 g via ORAL
  Filled 2024-09-24 (×2): qty 30

## 2024-09-24 MED ORDER — AMLODIPINE BESYLATE 10 MG PO TABS
10.0000 mg | ORAL_TABLET | Freq: Every day | ORAL | Status: DC
  Administered 2024-09-25 – 2024-09-26 (×2): 10 mg via ORAL
  Filled 2024-09-24 (×2): qty 1

## 2024-09-24 MED ORDER — OXYCODONE HCL 5 MG PO TABS
10.0000 mg | ORAL_TABLET | ORAL | Status: DC | PRN
  Administered 2024-09-24 – 2024-10-01 (×19): 10 mg via ORAL
  Filled 2024-09-24 (×19): qty 2

## 2024-09-24 NOTE — Progress Notes (Signed)
 PT Cancellation Note  Patient Details Name: ELIH MOONEY MRN: [id number] DOB: 06/10/1966   Cancelled Treatment:    Reason Eval/Treat Not Completed: Other (comment)  Pt asking for pain medication prior to session.  RN in and obtaining for pt.  Returned at tim requested.  Pt resting in bed but refused session.  There is too much going on right now  Stated he will not be participating today.  I'm sorry  Will return at later time/date.     Lauraine Gills 09/24/2024, 10:16 AM

## 2024-09-24 NOTE — Progress Notes (Signed)
 Patient has been observed to be verbally dismissive and hostile toward nursing staff throughout the shift. Patient repeatedly requested IV pain medication and patient was educated he needed to take PO pain medication first as IV pain medication is for working with physical therapy and breakthrough pain, despite education provided regarding medication pain plan and orders. Patient refused to participate with physical therapy sessions today per dayshift RN and patient stated he refused as well. This RN provided education regarding importance of mobility and therapy in recovery but patient grabbed his phone and chose to disregard eye contact with RN and verbalized understanding, he continued to decline participation as he stated if you all would just listen to me I know my body best.  Patient refusing RN staff do patients wound care and stated I only want the wound care nurse to do my dressing changes. Education was provided to patient about it being within this RN's scope of practice and competency to do so and patient still declined.  Patient made multiple statements expressing dissatisfaction with care, including stating that the healthcare team is incompetent and that he knows best regarding his medical care. Attempts made to address concerns, provide reassurance, and redirect conversation to plan of care. Patient remained unreceptive.  Patient was observed recording this RN and this RN stated are you recording me? If you are recording me I do not consent to being recorded. Please delete any recording of me as I do not give you my consent to being recorded. Patient stated you can talk to my attorney. I have been recording everyone that has come into my room. Brewing Technologist. This RN, supervisor, and security all went to ask patient to delete recordings and to stop recording staff and care interactions. Patient refused to delete recordings and stated I will not stop  recording.

## 2024-09-24 NOTE — Progress Notes (Signed)
 Progress Note   Patient: Shawn Rivas FMW:[id number] DOB: 1966-05-09 DOA: 09/19/2024     5 DOS: the patient was seen and examined on 09/24/2024   Brief hospital course: Per H&P HPI KMARION RAWL is a 58 y.o. year old male with medical history of HTN, HLD, T2DM presenting to the ED after a fall.    Patient states he fell from standing position as he was trying to reach something but slipped off the chair hurting his left hip. He states he lives alone and needs thinks he may need more help then he previously did.    On arrival to the ED patient was noted to be HDS stable.  Lab work and imaging obtained.  CBC with mild leukocytosis otherwise unremarkable.  BMP with mild hyponatremia and significant hyperglycemia otherwise normal renal function.  Imaging showed acute appearing, mildly displaced periprosthetic fracture of the left greater trochanter.  Given this, EDP discussed with Dr. Welford from orthopedic who recommended that this was to be managed nonoperatively with weightbearing as tolerated.  Given this, [facility] contacted for admission.     Assessment and Plan: Periprosthetic fracture of L greater trochanter repair  Manage nonoperatively per Ortho.  Weightbearing as tolerated.  Pain control.  Patient has wounds on left foot that causes him significant pain with ambulation.  He is supposed to get a cast or boot for the left foot to help manage pain.  He does note that taking pain medications prior to PT helps him be more participatory.  Plan for SNF. - Will discuss with patient's outpatient wound care team during which we will be helpful for his lower extremity.  T2DM complicated by chronic foot ulcers Uncontrolled A1c 12.7 2 months ago.  Patient is currently not taking his insulin  but is taking metformin .  Blood sugars are better controlled.  His a.m. blood sugar was 190 on BMP. Continue Lantus  to 45 units daily Continue metformin  Increase to 12u TID with meals from 8u  CBG (last 3)   Recent Labs    09/23/24 2041 09/24/24 0723 09/24/24 1132  GLUCAP 179* 196* 180*   - Foot ulcers do not appear infected he sees wound care outpatient for this, and wound care consulted here  HLD Not currently on statin  LDL 158 6 months ago.  Will need to discuss this with his PCP on discharge.  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 Encouraged adherence with antidiabetes medication.  On ARB. Serum creatinine of 1.4 could be baseline now.  Will continue to hold ARB, serum creatinine improved this a.m. - Continue to monitor BMP  NAMGA Mild in the setting of CKD. Will continue to monitor for now.    HTN Hold patient's ARB given worsening serum creatinine Will increase the amlodipine  to 10 mg daily     Subjective: No significant issues overnight.  Physical Exam: Vitals:   09/23/24 1512 09/23/24 2042 09/24/24 0300 09/24/24 0722  BP: (!) 147/104 (!) 138/94 130/74 (!) 154/73  Pulse: (!) 59 (!) 109 90 97  Resp: 19 19 18 17   Temp: 97.9 F (36.6 C) 98.6 F (37 C) 98.1 F (36.7 C) (!) 97.5 F (36.4 C)  TempSrc:   Oral   SpO2: 91% 99% 100% 96%  Weight:      Height:           Constitutional: In no distress.  Cardiovascular: Normal rate, regular rhythm.  Pulmonary: Non labored breathing on room air, no wheezing or rales.   Abdominal: Soft. Non distended  and non tender Musculoskeletal: Trace Bilateral Edema, LLE larger than RLE this is chronic after accident.  Unchagned from prior    Neurological: Alert and oriented to person, place, and time. Non focal  Skin: Plantar ulcer on L foot bandaged, LL malleolus ulcer bandaged    Data Reviewed:     Latest Ref Rng & Units 09/24/2024    8:47 AM 09/23/2024    5:23 AM 09/22/2024    6:42 AM  BMP  Glucose 70 - 99 mg/dL 805  808  809   BUN 6 - 20 mg/dL 44  46  38   Creatinine 0.61 - 1.24 mg/dL 8.50  8.34  8.36   Sodium 135 - 145 mmol/L 134  132  135   Potassium 3.5 - 5.1 mmol/L 4.4  3.9  3.8   Chloride 98 -  111 mmol/L 102  100  102   CO2 22 - 32 mmol/L 18  20  24    Calcium  8.9 - 10.3 mg/dL 9.0  8.8  9.2       Latest Ref Rng & Units 09/23/2024    5:23 AM 09/22/2024    6:42 AM 09/21/2024    4:32 AM  CBC  WBC 4.0 - 10.5 K/uL 8.9  8.9  9.2   Hemoglobin 13.0 - 17.0 g/dL 86.1  86.4  86.0   Hematocrit 39.0 - 52.0 % 40.7  40.6  40.0   Platelets 150 - 400 K/uL 324  330  318      Family Communication: None at bedside.   Disposition: Status is: Inpatient Remains inpatient appropriate because: management of pain of periprosthetic fracture.   Planned Discharge Destination: Pending PT/OT eval     Time spent: 35 minutes  Author: Alban Pepper, MD 09/24/2024 12:35 PM  For on call review [email or web address].

## 2024-09-24 NOTE — Plan of Care (Signed)

## 2024-09-25 DIAGNOSIS — M978XXD Periprosthetic fracture around other internal prosthetic joint, subsequent encounter: Secondary | ICD-10-CM | POA: Diagnosis not present

## 2024-09-25 DIAGNOSIS — Z96649 Presence of unspecified artificial hip joint: Secondary | ICD-10-CM | POA: Diagnosis not present

## 2024-09-25 LAB — GLUCOSE, CAPILLARY
Glucose-Capillary: 115 mg/dL — ABNORMAL HIGH (ref 70–99)
Glucose-Capillary: 169 mg/dL — ABNORMAL HIGH (ref 70–99)
Glucose-Capillary: 91 mg/dL (ref 70–99)
Glucose-Capillary: 77 mg/dL (ref 70–99)

## 2024-09-25 LAB — BASIC METABOLIC PANEL WITH GFR
Anion gap: 11 (ref 5–15)
BUN: 38 mg/dL — ABNORMAL HIGH (ref 6–20)
CO2: 22 mmol/L (ref 22–32)
Calcium: 9.3 mg/dL (ref 8.9–10.3)
Chloride: 102 mmol/L (ref 98–111)
Creatinine, Ser: 1.43 mg/dL — ABNORMAL HIGH (ref 0.61–1.24)
GFR, Estimated: 57 mL/min — ABNORMAL LOW (ref >60)
Glucose, Bld: 115 mg/dL — ABNORMAL HIGH (ref 70–99)
Potassium: 4.4 mmol/L (ref 3.5–5.1)
Sodium: 135 mmol/L (ref 135–145)

## 2024-09-25 MED ORDER — PSEUDOEPHEDRINE HCL ER 120 MG PO TB12
120.0000 mg | ORAL_TABLET | Freq: Every day | ORAL | Status: AC | PRN
  Filled 2024-09-25: qty 1

## 2024-09-25 MED ORDER — MORPHINE SULFATE (PF) 2 MG/ML IV SOLN
2.0000 mg | Freq: Every day | INTRAVENOUS | Status: DC | PRN
  Administered 2024-09-26: 14:00:00 2 mg via INTRAVENOUS
  Filled 2024-09-25: qty 1

## 2024-09-25 MED ORDER — FLUTICASONE PROPIONATE 50 MCG/ACT NA SUSP
1.0000 | Freq: Every day | NASAL | Status: DC
  Administered 2024-09-25: 16:00:00 1 via NASAL
  Filled 2024-09-25: qty 16

## 2024-09-25 MED ORDER — SALINE SPRAY 0.65 % NA SOLN
1.0000 | NASAL | Status: DC | PRN
  Filled 2024-09-25: qty 44

## 2024-09-25 NOTE — Plan of Care (Signed)
 This patient remains on AR-1A as of time of writing. The patient is AA+Ox4. Wound care provided by this RN overnight per orders. Fall / injury prevention measures initiated overnight by this RN.   Problem: Education: Goal: Ability to describe self-care measures that may prevent or decrease complications (Diabetes Survival Skills Education) will improve Outcome: Progressing Goal: Individualized Educational Video(s) Outcome: Progressing   Problem: Coping: Goal: Ability to adjust to condition or change in health will improve Outcome: Progressing   Problem: Fluid Volume: Goal: Ability to maintain a balanced intake and output will improve Outcome: Progressing   Problem: Health Behavior/Discharge Planning: Goal: Ability to identify and utilize available resources and services will improve Outcome: Progressing Goal: Ability to manage health-related needs will improve Outcome: Progressing   Problem: Metabolic: Goal: Ability to maintain appropriate glucose levels will improve Outcome: Progressing   Problem: Nutritional: Goal: Maintenance of adequate nutrition will improve Outcome: Progressing Goal: Progress toward achieving an optimal weight will improve Outcome: Progressing   Problem: Skin Integrity: Goal: Risk for impaired skin integrity will decrease Outcome: Progressing   Problem: Tissue Perfusion: Goal: Adequacy of tissue perfusion will improve Outcome: Progressing   Problem: Education: Goal: Knowledge of General Education information will improve Description: Including pain rating scale, medication(s)/side effects and non-pharmacologic comfort measures Outcome: Progressing   Problem: Health Behavior/Discharge Planning: Goal: Ability to manage health-related needs will improve Outcome: Progressing   Problem: Clinical Measurements: Goal: Ability to maintain clinical measurements within normal limits will improve Outcome: Progressing Goal: Will remain free from  infection Outcome: Progressing Goal: Diagnostic test results will improve Outcome: Progressing Goal: Respiratory complications will improve Outcome: Progressing Goal: Cardiovascular complication will be avoided Outcome: Progressing   Problem: Activity: Goal: Risk for activity intolerance will decrease Outcome: Progressing   Problem: Nutrition: Goal: Adequate nutrition will be maintained Outcome: Progressing   Problem: Coping: Goal: Level of anxiety will decrease Outcome: Progressing   Problem: Elimination: Goal: Will not experience complications related to bowel motility Outcome: Progressing Goal: Will not experience complications related to urinary retention Outcome: Progressing   Problem: Pain Managment: Goal: General experience of comfort will improve and/or be controlled Outcome: Progressing   Problem: Safety: Goal: Ability to remain free from injury will improve Outcome: Progressing   Problem: Skin Integrity: Goal: Risk for impaired skin integrity will decrease Outcome: Progressing   Problem: Education: Goal: Knowledge of disease and its progression will improve Outcome: Progressing Goal: Individualized Educational Video(s) Outcome: Progressing   Problem: Fluid Volume: Goal: Compliance with measures to maintain balanced fluid volume will improve Outcome: Progressing   Problem: Health Behavior/Discharge Planning: Goal: Ability to manage health-related needs will improve Outcome: Progressing   Problem: Nutritional: Goal: Ability to make healthy dietary choices will improve Outcome: Progressing   Problem: Clinical Measurements: Goal: Complications related to the disease process, condition or treatment will be avoided or minimized Outcome: Progressing

## 2024-09-25 NOTE — Progress Notes (Signed)
 Physical Therapy Treatment Patient Details Name: Shawn Rivas MRN: [id number] DOB: 12/31/1965 Today's Date: 09/25/2024   History of Present Illness [name] is a 58 y.o. year old male with medical history of TBI, HTN, HLD, T2DM presenting to the ED after a fall. Sustained a L periprosthetic fx, non surgical.    PT Comments  CAM boot arrived in room.  Agreeable to try.  He gets to EOB after dressing prior with min a x 1.  He needs mod a x 2 to stand to RW to pull up shorts.  Unable to let go of walker to assist.  He sits back down and is given time to try to don boot on his own.  While he can do top straps he is unable to manage the 3 lower ones and does need assist.  He is able to stand x 2 with mod a x 2 and take 2 small side steps to reposition.  He does not have his R shoe and AFO here and perseverates on leg length differences.  Stated he has asked family/friends to bring them in since last week and calls again during session, they stated they will not do so for him and that increases his frustration.  He does seem to like the CAM boot though with no c/o of pressure of pain from boot.  I feel better about this now.  Opts to keep boot on in bed after session To get used to it   Pt continues to require mod a x 2 for mobility and limited gait to no more than transfers at this time.  During session he does call a friend for a ride and stated he will be going home today and not to rehab.  Pt will be home alone and while he knows he will struggle to get into his home - has a ramp - I will just lay in bed until I die.  Pt overall shows poor safety awareness and judgement.  Pt will benefit from his shoe and AFO although I anticipate balance will be decreased with CAM boot increasing risk of falls.  Pt seems to have limited and unreliable assist at home.   If plan is discharge home, recommend the following: Two people to help with walking and/or transfers;A lot of help with  bathing/dressing/bathroom;Assist for transportation   Can travel by private vehicle        Equipment Recommendations  Wheelchair cushion (measurements PT);Other (comment);Wheelchair (measurements PT)    Recommendations for Other Services       Precautions / Restrictions Precautions Precautions: Fall Recall of Precautions/Restrictions: Intact Precaution/Restrictions Comments: no side stepping Restrictions Weight Bearing Restrictions Per Provider Order: Yes LLE Weight Bearing Per Provider Order: Weight bearing as tolerated Other Position/Activity Restrictions: patient also has wound on the bottom of his left foot which limits his ability to put weight on that leg.     Mobility  Bed Mobility Overal bed mobility: Needs Assistance Bed Mobility: Supine to Sit, Sit to Supine     Supine to sit: Min assist, Used rails, HOB elevated Sit to supine: Min assist, Used rails, HOB elevated     Patient Response: Impulsive, Anxious (TBI)  Transfers Overall transfer level: Needs assistance Equipment used: Rolling walker (2 wheels) Transfers: Sit to/from Stand Sit to Stand: Mod assist, +2 physical assistance, From elevated surface                Ambulation/Gait Ambulation/Gait assistance: Mod assist, +2 physical assistance Gait Distance (  Feet): 2 Feet Assistive device: Rolling walker (2 wheels) Gait Pattern/deviations: Step-to pattern Gait velocity: dec     General Gait Details: small sidesteps along bed.  opts to return to bed vs chair.   Stairs             Wheelchair Mobility     Tilt Bed Tilt Bed Patient Response: Impulsive, Anxious (TBI)  Modified Rankin (Stroke Patients Only)       Balance Overall balance assessment: Needs assistance Sitting-balance support: Feet supported Sitting balance-Leahy Scale: Good     Standing balance support: Bilateral upper extremity supported, During functional activity, Reliant on assistive device for balance Standing  balance-Leahy Scale: Poor Standing balance comment: +2 for safety and support                            Communication Communication Communication: Impaired Factors Affecting Communication: Difficulty expressing self  Cognition Arousal: Alert Behavior During Therapy: Anxious   PT - Cognitive impairments: Problem solving, Safety/Judgement, Attention, Sequencing                       PT - Cognition Comments: TBI history Following commands: Impaired Following commands impaired: Follows one step commands inconsistently, Follows one step commands with increased time    Cueing Cueing Techniques: Verbal cues, Gestural cues  Exercises      General Comments        Pertinent Vitals/Pain Pain Assessment Pain Assessment: Faces Faces Pain Scale: Hurts even more Pain Location: L hip with movement Pain Descriptors / Indicators: Discomfort, Grimacing, Guarding Pain Intervention(s): Limited activity within patient's tolerance, Monitored during session, Premedicated before session, Repositioned    Home Living                          Prior Function            PT Goals (current goals can now be found in the care plan section) Progress towards PT goals: Progressing toward goals    Frequency    Min 2X/week      PT Plan      Co-evaluation              AM-PAC PT 6 Clicks Mobility   Outcome Measure  Help needed turning from your back to your side while in a flat bed without using bedrails?: A Little Help needed moving from lying on your back to sitting on the side of a flat bed without using bedrails?: A Little Help needed moving to and from a bed to a chair (including a wheelchair)?: A Lot Help needed standing up from a chair using your arms (e.g., wheelchair or bedside chair)?: A Lot Help needed to walk in hospital room?: Total Help needed climbing 3-5 steps with a railing? : Total 6 Click Score: 12    End of Session Equipment  Utilized During Treatment: Gait belt Activity Tolerance: Patient limited by pain Patient left: in bed;with call bell/phone within reach;with bed alarm set Nurse Communication: Mobility status PT Visit Diagnosis: Other abnormalities of gait and mobility (R26.89);Muscle weakness (generalized) (M62.81);Pain;Difficulty in walking, not elsewhere classified (R26.2);History of falling (Z91.81) Pain - Right/Left: Left Pain - part of body: Leg;Ankle and joints of foot     Time: 1128-1204 PT Time Calculation (min) (ACUTE ONLY): 36 min  Charges:    $Therapeutic Activity: 23-37 mins PT General Charges $$ ACUTE PT VISIT: 1 Visit  Lauraine Gills, PTA 09/25/2024, 12:49 PM

## 2024-09-25 NOTE — Plan of Care (Addendum)
 Per patient - pressed call light 0705 to inform bed is soaking wet.  Secretary confirmed call for assistance and Chatted x2 NT for assistance.  Patient states he has been trying to get help for over an hour and noone would come.  Patient used urinal and was cleaned up as needed.  9189 NT was informed by patient  - I am hurting and going to take me home pain meds if you dont get me the meds I'm requesting NOW. (NT stated that a pill bottle was in patient's hand.) Dr. Informed and rounded to discuss safety/expectations with patient. When asked about having his home pain meds at bedside, patient stated, I was misunderstood, I dont have my personal meds here!  0945  While attempting to give daily meds, patient refused miralax , senna and lovenox  shot.  RN educated, but patient still refused.  CBG was 115 this AM - Dr. Informed and gave verbal ok to give base novolog12U and LA 45Units (but NO additional sliding), ONLY IF patient eats.  This RN asked patient if he was going to eat his food and replied, not now I might in a little bit.  Patient asked to please let staff know if he eats, so appropriate insulin  can be given.  Insulin  to be held, unless patient eats.  CBG to be re-checked at lunchtime.

## 2024-09-25 NOTE — TOC Progression Note (Signed)
 Transition of Care [facility]) - Progression Note    Patient Details  Name: Shawn Rivas MRN: [id number] Date of Birth: 03-17-1966  [facility] CM/SW Contact  Alvaro Louder, [place] Phone Number: 09/25/2024, 3:56 PM  Clinical Narrative:   LCSWA met with the patient at the bedside to discuss [facility] VS SNF. Patient indicated that he still does not want to go to SNF and prefers HH. LCSWA indicated understanding and proceeded with selected [facility] for him. [facility] to follow at DC.   TOC to follow for discharge    Expected Discharge Plan: Home w Home Health Services Barriers to Discharge: Continued Medical Work up               Expected Discharge Plan and Services   Discharge Planning Services: CM Consult   Living arrangements for the past 2 months: Mobile Home                                       Social Drivers of Health (SDOH) Interventions SDOH Screenings   Food Insecurity: No Food Insecurity (09/19/2024)  Housing: Low Risk (09/19/2024)  Transportation Needs: No Transportation Needs (09/19/2024)  Utilities: Not At Risk (09/19/2024)  Depression (PHQ2-9): Low Risk (05/15/2024)  Financial Resource Strain: Low Risk  (06/29/2024)   Received from [facility]  Physical Activity: Inactive (03/21/2024)  Social Connections: Moderately Isolated (09/19/2024)  Stress: Stress Concern Present (03/21/2024)  Tobacco Use: Medium Risk (09/19/2024)  Health Literacy: Adequate Health Literacy (03/21/2024)    Readmission Risk Interventions     No data to display

## 2024-09-25 NOTE — Consult Note (Signed)
 WOC Nurse Consult Note: Reason for Consult: Requested by hospitalist to place Cam walker (Defender boot) for offloading plantar surface wound Contacted materials department for Cam offloading boot. Contacted [facility] orthopedic tech for appropriate sizing based on patient's height and weight.  Large Cam boot picked up and delivered to the room.  If this does not offload enough I will contact [name] orthopedic tech again, they will need to add to the boot to elevate plantar surface more.   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 Moisten previous dressings with NS to assist with removal each time.   PT to notified WOC if offloading not sufficient with Cam boot.   Team aware via [place].   Discussed POC with patient and bedside nurse.  Re consult if needed, will not follow at this time. Thanks  Andilynn Delavega [employer], RN,CWOCN, CNS, [employer] [phone]

## 2024-09-25 NOTE — Progress Notes (Signed)
 Signed      Spoke with patient about staff concerns that he had been making recordings with his cell phone. I requested that he delete any recordings that he made of staff or care interactions and he adamantly refused to delete recordings.

## 2024-09-25 NOTE — Progress Notes (Addendum)
 " Progress Note   Patient: Shawn Rivas FMW:[id number] DOB: 1966-03-31 DOA: 09/19/2024     6 DOS: the patient was seen and examined on 09/25/2024   Brief hospital course: Per H&P HPI JARL SELLITTO is a 58 y.o. year old male with medical history of HTN, HLD, T2DM presenting to the ED after a fall.    Patient states he fell from standing position as he was trying to reach something but slipped off the chair hurting his left hip. He states he lives alone and needs thinks he may need more help then he previously did.    On arrival to the ED patient was noted to be HDS stable.  Lab work and imaging obtained.  CBC with mild leukocytosis otherwise unremarkable.  BMP with mild hyponatremia and significant hyperglycemia otherwise normal renal function.  Imaging showed acute appearing, mildly displaced periprosthetic fracture of the left greater trochanter.  Given this, EDP discussed with Dr. Welford from orthopedic who recommended that this was to be managed nonoperatively with weightbearing as tolerated.  Given this, [facility] contacted for admission.     Assessment and Plan: Periprosthetic fracture of L greater trochanter repair  Manage nonoperatively per Ortho.  Weightbearing as tolerated.  Pain control.  Patient has wounds on left foot that causes him significant pain with ambulation.  He is supposed to get a cast or boot for the left foot to help manage pain.  He does note that taking pain medications prior to PT helps him be more participatory.  Plan for SNF. - Has cam boot delivered, plan for PT  T2DM complicated by chronic foot ulcers Uncontrolled A1c 12.7 2 months ago.  Patient is currently not taking his insulin  but is taking metformin .  Blood sugars are better controlled.  His a.m. blood sugar was 190 on BMP. Continue Lantus  to 45 units daily Continue metformin  continue 12 units 3 times daily and mealtime insulin  CBG (last 3)  Recent Labs    09/24/24 2126 09/25/24 0804 09/25/24 1214  GLUCAP  96 115* 169*   - Foot ulcers do not appear infected he sees wound care outpatient for this, and wound care consulted here - Patient will need to discharge on insulin .  HLD Not currently on statin  LDL 158 6 months ago.  Will need to discuss this with his PCP on discharge.  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 Encouraged adherence with antidiabetes medication.  On ARB. Serum creatinine of 1.4 could be baseline now.  Serum creatinine 4.12 possibly erroneous. Will continue to hold ARB, serum creatinine improved this a.m. - Continue to monitor BMP  Non anion gap metabolic acidosis Resolved   HTN Hold patient's ARB given worsening serum creatinine Continue amlodipine  to 10 mg daily     Subjective: No significant issues overnight.  Physical Exam: Vitals:   09/24/24 2040 09/25/24 0441 09/25/24 0500 09/25/24 1444  BP: (!) 150/83 (!) 153/84  137/83  Pulse: 100 97  98  Resp: 16 16  18   Temp: (!) 97.5 F (36.4 C) 98 F (36.7 C)  98.4 F (36.9 C)  TempSrc:    Oral  SpO2: 100% 100%  95%  Weight:   107 kg   Height:         Constitutional: In no distress.  Cardiovascular: Normal rate, regular rhythm. No lower extremity edema  Pulmonary: Non labored breathing on room air, no wheezing or rales.   Abdominal: Soft. Non distended and non tender Musculoskeletal: Normal range of motion.  LLE larger than RLE this is chronic after accident.  Unchagned from prior    Neurological: Alert and oriented to person, place, and time. Non focal  Skin: Skin is warm and dry. Plantar ulcer on L foot bandaged, LL malleolus ulcer bandaged   Data Reviewed:     Latest Ref Rng & Units 09/25/2024    5:22 AM 09/24/2024    8:47 AM 09/23/2024    5:23 AM  BMP  Glucose 70 - 99 mg/dL 884  805  808   BUN 6 - 20 mg/dL 38  44  46   Creatinine 0.61 - 1.24 mg/dL 8.56  8.50  8.34   Sodium 135 - 145 mmol/L 135  134  132   Potassium 3.5 - 5.1 mmol/L 4.4  4.4  3.9   Chloride 98 - 111 mmol/L 102   102  100   CO2 22 - 32 mmol/L 22  18  20    Calcium  8.9 - 10.3 mg/dL 9.3  9.0  8.8       Latest Ref Rng & Units 09/23/2024    5:23 AM 09/22/2024    6:42 AM 09/21/2024    4:32 AM  CBC  WBC 4.0 - 10.5 K/uL 8.9  8.9  9.2   Hemoglobin 13.0 - 17.0 g/dL 86.1  86.4  86.0   Hematocrit 39.0 - 52.0 % 40.7  40.6  40.0   Platelets 150 - 400 K/uL 324  330  318      Family Communication: None at bedside.   Disposition: Status is: Inpatient Remains inpatient appropriate because: management of pain of periprosthetic fracture.   Planned Discharge Destination: Pending PT/OT eval     Time spent: 35 minutes  Author: Alban Pepper, MD 09/25/2024 5:38 PM  For on call review [email or web address].  "

## 2024-09-25 NOTE — Plan of Care (Signed)
°  Problem: Health Behavior/Discharge Planning: Goal: Ability to manage health-related needs will improve Outcome: Progressing   Problem: Education: Goal: Knowledge of General Education information will improve Description: Including pain rating scale, medication(s)/side effects and non-pharmacologic comfort measures Outcome: Progressing   Problem: Pain Managment: Goal: General experience of comfort will improve and/or be controlled Outcome: Progressing   Problem: Coping: Goal: Level of anxiety will decrease Outcome: Not Progressing

## 2024-09-25 NOTE — Progress Notes (Signed)
 Met with the patient to hear his concerns surrounding his care and reiterate that per our policy, his care team members can leave the room without completing care if he refuses to stop recording. He expressed understanding. I provided a notebook for his care team phone numbers to be written down for him to call the appropriate team member directly for care needs to avoid delays.

## 2024-09-25 NOTE — Progress Notes (Signed)
 OT Cancellation Note  Patient Details Name: Shawn Rivas MRN: [id number] DOB: 1966-05-08   Cancelled Treatment:    Reason Eval/Treat Not Completed: Patient declined, no reason specified;Other (comment). OT continues to follow pt for therapy services. Attempted to see pt for co-treatment with PT, however, upon arrival to pt room, pt adamantly declines. States I'm not doing anything, I'm not seeing you until I have my boot. Will hold at this time and re-attempt at a later date/time as available and pt medically appropriate for OT services.   [name], M.S., OTR/L 09/25/2024, 9:57 AM

## 2024-09-26 DIAGNOSIS — Z96649 Presence of unspecified artificial hip joint: Secondary | ICD-10-CM | POA: Diagnosis not present

## 2024-09-26 DIAGNOSIS — M978XXD Periprosthetic fracture around other internal prosthetic joint, subsequent encounter: Secondary | ICD-10-CM | POA: Diagnosis not present

## 2024-09-26 LAB — GLUCOSE, CAPILLARY
Glucose-Capillary: 94 mg/dL (ref 70–99)
Glucose-Capillary: 133 mg/dL — ABNORMAL HIGH (ref 70–99)
Glucose-Capillary: 83 mg/dL (ref 70–99)
Glucose-Capillary: 69 mg/dL — ABNORMAL LOW (ref 70–99)
Glucose-Capillary: 84 mg/dL (ref 70–99)

## 2024-09-26 LAB — BASIC METABOLIC PANEL WITH GFR
Anion gap: 11 (ref 5–15)
BUN: 28 mg/dL — ABNORMAL HIGH (ref 6–20)
CO2: 22 mmol/L (ref 22–32)
Calcium: 9.1 mg/dL (ref 8.9–10.3)
Chloride: 102 mmol/L (ref 98–111)
Creatinine, Ser: 1.23 mg/dL (ref 0.61–1.24)
GFR, Estimated: >60 mL/min (ref >60)
Glucose, Bld: 95 mg/dL (ref 70–99)
Potassium: 4.3 mmol/L (ref 3.5–5.1)
Sodium: 135 mmol/L (ref 135–145)

## 2024-09-26 MED ORDER — IRBESARTAN 150 MG PO TABS
150.0000 mg | ORAL_TABLET | Freq: Every day | ORAL | Status: DC
  Administered 2024-09-27 – 2024-09-30 (×4): 150 mg via ORAL
  Filled 2024-09-26 (×4): qty 1

## 2024-09-26 MED ORDER — DIPHENHYDRAMINE HCL 25 MG PO CAPS
25.0000 mg | ORAL_CAPSULE | Freq: Two times a day (BID) | ORAL | Status: DC | PRN
  Administered 2024-09-26 – 2024-09-30 (×4): 25 mg via ORAL
  Filled 2024-09-26 (×4): qty 1

## 2024-09-26 MED ORDER — AMLODIPINE BESYLATE 5 MG PO TABS
2.5000 mg | ORAL_TABLET | Freq: Every day | ORAL | Status: DC
  Administered 2024-09-27: 09:00:00 2.5 mg via ORAL
  Filled 2024-09-26: qty 1

## 2024-09-26 NOTE — Progress Notes (Signed)
 Physical Therapy Treatment Patient Details Name: Shawn Rivas MRN: [id number] DOB: 1966-06-17 Today's Date: 09/26/2024   History of Present Illness Shawn Rivas is a 58 y.o. year old male with medical history of TBI, HTN, HLD, T2DM presenting to the ED after a fall. Sustained a L periprosthetic fx, non surgical.    PT Comments  Pt received seated in recliner upon arrival to room and pt agreeable to therapy.   Pt seen for PT/OT co-tx.  Pt given time for pain medications and allowed for them to kick in according tot he pt, however he is still noting increased pain.  Pt required minA +2 from therapist to come into standing, however once in standing was able to utilize CGA from +1 while OT was able to grab linen to increase the height of the recliner for easier transfer later.  Pt continues to be very verbose and tangential at times.  Pt noting increased spasms and making sudden movements at times due to the perceived pain.  Pt did not the boot was able to offload the foot/sore well.  Pt then elected to return to the recliner and was left with all needs within reach.     If plan is discharge home, recommend the following: Two people to help with walking and/or transfers;A lot of help with bathing/dressing/bathroom;Assist for transportation   Can travel by private vehicle     No  Equipment Recommendations  Wheelchair cushion (measurements PT);Other (comment);Wheelchair (measurements PT)    Recommendations for Other Services       Precautions / Restrictions Precautions Precautions: Fall Recall of Precautions/Restrictions: Intact Precaution/Restrictions Comments: no side stepping Required Braces or Orthoses: Other Brace Other Brace: boot for LLE Restrictions Weight Bearing Restrictions Per Provider Order: Yes LLE Weight Bearing Per Provider Order: Weight bearing as tolerated Other Position/Activity Restrictions: patient also has wound on the bottom of his left foot which limits his  ability to put weight on that leg.     Mobility  Bed Mobility               General bed mobility comments: NT, in recliner    Transfers Overall transfer level: Needs assistance Equipment used: Rolling walker (2 wheels) Transfers: Sit to/from Stand Sit to Stand: Min assist, Mod assist, +2 physical assistance           General transfer comment: VC for hand placement, incr time to prepare and VC to redirect, MIN-MOD A +2 to stand, once in standing CGA +2 safety to maintain static standing balance    Ambulation/Gait                   Stairs             Wheelchair Mobility     Tilt Bed    Modified Rankin (Stroke Patients Only)       Balance Overall balance assessment: Needs assistance Sitting-balance support: Feet supported Sitting balance-Leahy Scale: Good     Standing balance support: Bilateral upper extremity supported, During functional activity, Reliant on assistive device for balance Standing balance-Leahy Scale: Poor Standing balance comment: +2 for safety, easily distracted requiring VC to redirect, intermittently takes hand off the RW and has LOB requiring CGA to self correct                            Communication Communication Communication: Impaired Factors Affecting Communication: Difficulty expressing self  Cognition Arousal: Alert Behavior During Therapy: [facility]  for tasks assessed/performed                             Following commands: Impaired Following commands impaired: Follows one step commands inconsistently, Follows one step commands with increased time    Cueing Cueing Techniques: Verbal cues, Gestural cues  Exercises      General Comments        Pertinent Vitals/Pain Pain Assessment Pain Assessment: Faces Faces Pain Scale: Hurts whole lot Pain Location: intermittent LLE spasms Pain Descriptors / Indicators: Radiating, Sharp, Shooting, Spasm, Moaning, Grimacing, Guarding Pain  Intervention(s): Limited activity within patient's tolerance, Monitored during session, Premedicated before session, Repositioned    Home Living                          Prior Function            PT Goals (current goals can now be found in the care plan section) Acute Rehab PT Goals Patient Stated Goal: to improve pain and mobility PT Goal Formulation: With patient Time For Goal Achievement: 10/04/24 Potential to Achieve Goals: Fair Progress towards PT goals: Progressing toward goals    Frequency    Min 2X/week      PT Plan      Co-evaluation   Reason for Co-Treatment: For patient/therapist safety;Necessary to address cognition/behavior during functional activity;To address functional/ADL transfers PT goals addressed during session: Mobility/safety with mobility;Balance;Proper use of DME OT goals addressed during session: ADL's and self-care      AM-PAC PT 6 Clicks Mobility   Outcome Measure  Help needed turning from your back to your side while in a flat bed without using bedrails?: A Little Help needed moving from lying on your back to sitting on the side of a flat bed without using bedrails?: A Little Help needed moving to and from a bed to a chair (including a wheelchair)?: A Lot Help needed standing up from a chair using your arms (e.g., wheelchair or bedside chair)?: A Lot Help needed to walk in hospital room?: Total Help needed climbing 3-5 steps with a railing? : Total 6 Click Score: 12    End of Session Equipment Utilized During Treatment: Gait belt Activity Tolerance: Patient limited by pain Patient left: in bed;with call bell/phone within reach;with bed alarm set Nurse Communication: Mobility status PT Visit Diagnosis: Other abnormalities of gait and mobility (R26.89);Muscle weakness (generalized) (M62.81);Pain;Difficulty in walking, not elsewhere classified (R26.2);History of falling (Z91.81) Pain - Right/Left: Left Pain - part of body:  Leg;Ankle and joints of foot     Time: 8491-8460 PT Time Calculation (min) (ACUTE ONLY): 31 min  Charges:    $Therapeutic Activity: 8-22 mins PT General Charges $$ ACUTE PT VISIT: 1 Visit                     Fonda Simpers, PT, DPT Physical Therapist - [facility]  09/26/2024, 4:56 PM

## 2024-09-26 NOTE — Progress Notes (Signed)
 Occupational Therapy Treatment Patient Details Name: Shawn Rivas MRN: [id number] DOB: 03/23/1966 Today's Date: 09/26/2024   History of present illness Shawn Rivas is a 58 y.o. year old male with medical history of TBI, HTN, HLD, T2DM presenting to the ED after a fall. Sustained a L periprosthetic fx, non surgical.   OT comments  Pt seen for OT tx and co-tx with PT. Pt agreeable, endorsing pain medication was now working. Pt required MOD A for donning sock and AFO/shoe on R foot from recliner position, MAX A for donning boot over LLE. Pt endorsing painful intermittent spasms through his LLE and noted to have a couple during session. Pt agreeable to stand with boot, requiring MIN A +2 for initial stand with RW. Once in standing, pt tolerated standing for <62min with a few slight LOBs requiring CGA +2 safety, as he tends to take UE off RW to point at something or rub his face which throws his balance off. Pt did endorse that the boot did a good job of keeping weight off of his L foot wound. Pt required increased time throughout session, intermittent VC to redirect to task. Continues to benefit from skilled OT services.       If plan is discharge home, recommend the following:  Two people to help with walking and/or transfers;A lot of help with bathing/dressing/bathroom;Assist for transportation;Help with stairs or ramp for entrance   Equipment Recommendations  Other (comment) (defer)    Recommendations for Other Services      Precautions / Restrictions Precautions Precautions: Fall Recall of Precautions/Restrictions: Intact Precaution/Restrictions Comments: no side stepping Required Braces or Orthoses: Other Brace Other Brace: boot for LLE Restrictions Weight Bearing Restrictions Per Provider Order: Yes LLE Weight Bearing Per Provider Order: Weight bearing as tolerated Other Position/Activity Restrictions: patient also has wound on the bottom of his left foot which limits his ability  to put weight on that leg.       Mobility Bed Mobility               General bed mobility comments: NT, in recliner    Transfers Overall transfer level: Needs assistance Equipment used: Rolling walker (2 wheels) Transfers: Sit to/from Stand Sit to Stand: Min assist, Mod assist, +2 physical assistance           General transfer comment: VC for hand placement, incr time to prepare and VC to redirect, MIN-MOD A +2 to stand, once in standing CGA +2 safety to maintain static standing balance     Balance Overall balance assessment: Needs assistance Sitting-balance support: Feet supported Sitting balance-Leahy Scale: Good     Standing balance support: Bilateral upper extremity supported, During functional activity, Reliant on assistive device for balance Standing balance-Leahy Scale: Poor Standing balance comment: +2 for safety, easily distracted requiring VC to redirect, intermittently takes hand off the RW and has LOB requiring CGA to self correct                           ADL either performed or assessed with clinical judgement   ADL Overall ADL's : Needs assistance/impaired                     Lower Body Dressing: Sitting/lateral leans;Moderate assistance;Maximal assistance Lower Body Dressing Details (indicate cue type and reason): MOD A to don AFO/shoe on R foot, MAX A for donning boot on L foot  Extremity/Trunk Assessment              Occupational Psychologist Communication: Impaired Factors Affecting Communication: Difficulty expressing self   Cognition Arousal: Alert Behavior During Therapy: WFL for tasks assessed/performed               OT - Cognition Comments: hx TBI, impaired attention requiring VC to redirect, easily overwhelmed with multitasking                 Following commands: Impaired Following commands impaired: Follows one step  commands inconsistently, Follows one step commands with increased time      Cueing   Cueing Techniques: Verbal cues, Gestural cues  Exercises Other Exercises Other Exercises: Pt edu in PLB to support safety and refocusing on task at hand, educated in ADL transfers, strategies to more independently move LLE while in the hospital (using fabric gait belt to hook around foot)    Shoulder Instructions       General Comments      Pertinent Vitals/ Pain       Pain Assessment Pain Assessment: Faces Faces Pain Scale: Hurts whole lot Pain Location: intermittent LLE spasms Pain Descriptors / Indicators: Radiating, Sharp, Shooting, Spasm, Moaning, Grimacing, Guarding Pain Intervention(s): Limited activity within patient's tolerance, Monitored during session, Premedicated before session, Repositioned  Home Living                                          Prior Functioning/Environment              Frequency  Min 2X/week        Progress Toward Goals  OT Goals(current goals can now be found in the care plan section)  Progress towards OT goals: Progressing toward goals  Acute Rehab OT Goals Patient Stated Goal: go home OT Goal Formulation: With patient Time For Goal Achievement: 10/04/24 Potential to Achieve Goals: Fair  Plan      Co-evaluation    PT/OT/SLP Co-Evaluation/Treatment: Yes Reason for Co-Treatment: For patient/therapist safety;Necessary to address cognition/behavior during functional activity;To address functional/ADL transfers PT goals addressed during session: Mobility/safety with mobility;Balance;Proper use of DME OT goals addressed during session: ADL's and self-care      AM-PAC OT 6 Clicks Daily Activity     Outcome Measure   Help from another person eating meals?: None Help from another person taking care of personal grooming?: None Help from another person toileting, which includes using toliet, bedpan, or urinal?: A Lot Help  from another person bathing (including washing, rinsing, drying)?: A Lot Help from another person to put on and taking off regular upper body clothing?: A Little Help from another person to put on and taking off regular lower body clothing?: A Lot 6 Click Score: 17    End of Session Equipment Utilized During Treatment: Rolling walker (2 wheels);Gait belt  OT Visit Diagnosis: Unsteadiness on feet (R26.81);Muscle weakness (generalized) (M62.81);History of falling (Z91.81)   Activity Tolerance Patient tolerated treatment well;Patient limited by pain   Patient Left in chair;with call bell/phone within reach;with chair alarm set   Nurse Communication          Time: 8491-8460 OT Time Calculation (min): 31 min  Charges: OT General Charges $OT Visit: 1 Visit OT Treatments $Self Care/Home Management : 8-22 mins  Warren  R., MPH, MS, OTR/L ascom [phone] 09/26/2024, 4:14 PM

## 2024-09-26 NOTE — Plan of Care (Signed)
°  Problem: Education: Goal: Ability to describe self-care measures that may prevent or decrease complications (Diabetes Survival Skills Education) will improve Outcome: Progressing Goal: Individualized Educational Video(s) Outcome: Progressing   Problem: Skin Integrity: Goal: Risk for impaired skin integrity will decrease Outcome: Progressing   Problem: Tissue Perfusion: Goal: Adequacy of tissue perfusion will improve Outcome: Progressing   Problem: Activity: Goal: Risk for activity intolerance will decrease Outcome: Progressing   Problem: Nutrition: Goal: Adequate nutrition will be maintained Outcome: Progressing   Problem: Coping: Goal: Level of anxiety will decrease Outcome: Progressing   Problem: Safety: Goal: Ability to remain free from injury will improve Outcome: Progressing

## 2024-09-26 NOTE — Progress Notes (Signed)
 Progress Note   Patient: Shawn Rivas FMW:[id number] DOB: 26-Jul-1966 DOA: 09/19/2024     7 DOS: the patient was seen and examined on 09/26/2024   Brief hospital course: Per H&P HPI Shawn Rivas is a 58 y.o. year old male with medical history of HTN, HLD, T2DM presenting to the ED after a fall.    Patient states he fell from standing position as he was trying to reach something but slipped off the chair hurting his left hip. He states he lives alone and needs thinks he may need more help then he previously did.    On arrival to the ED patient was noted to be HDS stable.  Lab work and imaging obtained.  CBC with mild leukocytosis otherwise unremarkable.  BMP with mild hyponatremia and significant hyperglycemia otherwise normal renal function.  Imaging showed acute appearing, mildly displaced periprosthetic fracture of the left greater trochanter.  Given this, EDP discussed with Dr. Welford from orthopedic who recommended that this was to be managed nonoperatively with weightbearing as tolerated.  Given this, TRH contacted for admission.     Assessment and Plan: Periprosthetic fracture of L greater trochanter repair  Manage nonoperatively per Ortho.  Weightbearing as tolerated.  Pain control.  Patient has wounds on left foot that causes him significant pain with ambulation.  He is supposed to get a cast or boot for the left foot to help manage pain.  Patient is declining snf, has CAM boot to help offload pain from diabetic plantar ulcer on L foot.  -Can discharge home with HH in the AM  - Has cam boot delivered, plan for PT  T2DM complicated by chronic foot ulcers Uncontrolled A1c 12.7 2 months ago.  Patient is currently not taking his insulin  but is taking metformin .  Blood sugars are better controlled.  His a.m. blood sugar was 190 on BMP. Continue Lantus  to 45 units daily Continue metformin  continue 12 units 3 times daily and mealtime insulin  CBG (last 3)  Recent Labs    09/26/24 0822  09/26/24 1119 09/26/24 1620  GLUCAP 94 133* 83   - Foot ulcers do not appear infected he sees wound care outpatient for this, and wound care consulted here - Patient will need to discharge on insulin , likely just long acting. He has not been able to afford tresiba  that was sent for him.   HLD Not currently on statin  LDL 158 6 months ago.  Will need to discuss this with his PCP on discharge.  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  With significant microalbuminuria. Encouraged adherence with antidiabetes medication.  On ARB. Serum creatinine of 1.4 could be baseline now.  Serum creatinine 4.12 possibly erroneous. -Will resume ARB in the AM given scr improved and decrease amlodipine     NAMGA Resolved   HTN Resume patients arb in the am and hold amlodipine  which was started this admission.      Subjective: No significant issues overnight.  Physical Exam: Vitals:   09/25/24 2017 09/26/24 0437 09/26/24 0819 09/26/24 1518  BP: (!) 152/85 134/77 (!) 150/80 135/87  Pulse:  95 91 98  Resp: 18 18 17 17   Temp: 98.4 F (36.9 C) 98.4 F (36.9 C) 98.1 F (36.7 C) 98.1 F (36.7 C)  TempSrc:   Oral   SpO2: 99% 98% 97% 100%  Weight:  107.5 kg    Height:          Constitutional: In no distress.  Cardiovascular: Normal rate, regular rhythm. No lower extremity edema  Pulmonary: Non labored breathing on room air, no wheezing or rales.   Abdominal: Soft. Non distended and non tender Musculoskeletal:  LLE larger than RLE this is chronic after accident.  Unchagned from prior    Neurological: Alert and oriented to person, place, and time. Non focal  Skin: Skin is warm and dry.  Plantar ulcer on L foot bandaged, LL malleolus ulcer bandaged    Data Reviewed:     Latest Ref Rng & Units 09/26/2024    9:00 AM 09/25/2024    5:22 AM 09/24/2024    8:47 AM  BMP  Glucose 70 - 99 mg/dL 95  884  805   BUN 6 - 20 mg/dL 28  38  44   Creatinine 0.61 - 1.24 mg/dL 8.76  8.56  8.50   Sodium 135 - 145  mmol/L 135  135  134   Potassium 3.5 - 5.1 mmol/L 4.3  4.4  4.4   Chloride 98 - 111 mmol/L 102  102  102   CO2 22 - 32 mmol/L 22  22  18    Calcium  8.9 - 10.3 mg/dL 9.1  9.3  9.0       Latest Ref Rng & Units 09/23/2024    5:23 AM 09/22/2024    6:42 AM 09/21/2024    4:32 AM  CBC  WBC 4.0 - 10.5 K/uL 8.9  8.9  9.2   Hemoglobin 13.0 - 17.0 g/dL 86.1  86.4  86.0   Hematocrit 39.0 - 52.0 % 40.7  40.6  40.0   Platelets 150 - 400 K/uL 324  330  318      Family Communication: None at bedside.   Disposition: Status is: Inpatient Remains inpatient appropriate because: management of pain of periprosthetic fracture.   Planned Discharge Destination: Pending PT/OT eval     Time spent: 35 minutes  Author: Alban Pepper, MD 09/26/2024 7:30 PM  For on call review [email or web address].

## 2024-09-26 NOTE — Progress Notes (Signed)
 DEVRIN MONFORTE                                          MRN: [id number]   09/26/2024   The [facility] Quality Team Specialist reviewed this patient medical record for the purposes of chart review for care gap closure. The following were reviewed: chart review for care gap closure-glycemic status assessment.    [facility] Quality Team

## 2024-09-27 ENCOUNTER — Other Ambulatory Visit: Payer: Self-pay | Admitting: Family Medicine

## 2024-09-27 DIAGNOSIS — M7542 Impingement syndrome of left shoulder: Secondary | ICD-10-CM

## 2024-09-27 DIAGNOSIS — M978XXA Periprosthetic fracture around other internal prosthetic joint, initial encounter: Secondary | ICD-10-CM | POA: Diagnosis not present

## 2024-09-27 DIAGNOSIS — Z96649 Presence of unspecified artificial hip joint: Secondary | ICD-10-CM | POA: Diagnosis not present

## 2024-09-27 DIAGNOSIS — G8929 Other chronic pain: Secondary | ICD-10-CM

## 2024-09-27 LAB — GLUCOSE, CAPILLARY
Glucose-Capillary: 77 mg/dL (ref 70–99)
Glucose-Capillary: 100 mg/dL — ABNORMAL HIGH (ref 70–99)
Glucose-Capillary: 122 mg/dL — ABNORMAL HIGH (ref 70–99)
Glucose-Capillary: 180 mg/dL — ABNORMAL HIGH (ref 70–99)

## 2024-09-27 LAB — HEMOGLOBIN A1C
Hgb A1c MFr Bld: 10.5 % — ABNORMAL HIGH (ref 4.8–5.6)
Mean Plasma Glucose: 254.65 mg/dL

## 2024-09-27 MED ORDER — INSULIN GLARGINE 100 UNIT/ML ~~LOC~~ SOLN
25.0000 [IU] | Freq: Every day | SUBCUTANEOUS | Status: DC
  Administered 2024-09-27 – 2024-09-30 (×4): 25 [IU] via SUBCUTANEOUS
  Filled 2024-09-27 (×4): qty 0.25

## 2024-09-27 MED ORDER — INSULIN GLARGINE 100 UNIT/ML ~~LOC~~ SOLN
10.0000 [IU] | Freq: Every day | SUBCUTANEOUS | Status: DC
  Filled 2024-09-27: qty 0.1

## 2024-09-27 MED ORDER — INSULIN GLARGINE 100 UNIT/ML ~~LOC~~ SOLN: 40.0000 [IU] | Freq: Every day | SUBCUTANEOUS | Status: DC

## 2024-09-27 MED ORDER — METHOCARBAMOL 500 MG PO TABS
500.0000 mg | ORAL_TABLET | Freq: Three times a day (TID) | ORAL | Status: DC | PRN
  Administered 2024-09-27 – 2024-10-01 (×9): 500 mg via ORAL
  Filled 2024-09-27 (×9): qty 1

## 2024-09-27 NOTE — Progress Notes (Signed)
 Mobility Specialist - Progress Note   09/27/24 1611  Mobility  Activity Dangled on edge of bed;Pivoted/transferred from bed to chair;Pivoted/transferred from chair to bed  Level of Assistance Minimal assist, patient does 75% or more  Assistive Device Sliding board  LLE Weight Bearing Per Provider Order WBAT  Activity Response Tolerated well  Mobility visit 1 Mobility  Mobility Specialist Start Time (ACUTE ONLY) 1508  Mobility Specialist Stop Time (ACUTE ONLY) 1556  Mobility Specialist Time Calculation (min) (ACUTE ONLY) 48 min   Pt supine upon entry, utilizing RA. Pt sat up in bed w/ ModA to bring trunk from sup to sit, completing 6 quad sets w/ towel--- tolerated well. Pt transferred EOB ModA +2 requiring CGA for trunk support once seated EOB. Pt transferred to the recliner via sliding board MinA +2 for safety. Once seated, he completed 10 heel slides w/ gait belt, voicing pain when sliding heel away from person. MS completed linen change and transferred Pt to bed, requiring ModA +2 from the recliner. Pt left semi fowler with alarm set and needs within reach.  America Silvan Mobility Specialist 09/27/2024 4:45 PM

## 2024-09-27 NOTE — Plan of Care (Signed)
°  Problem: Coping: Goal: Ability to adjust to condition or change in health will improve Outcome: Progressing   Problem: Metabolic: Goal: Ability to maintain appropriate glucose levels will improve Outcome: Progressing   Problem: Education: Goal: Knowledge of General Education information will improve Description: Including pain rating scale, medication(s)/side effects and non-pharmacologic comfort measures Outcome: Progressing   Problem: Coping: Goal: Level of anxiety will decrease Outcome: Progressing

## 2024-09-27 NOTE — Inpatient Diabetes Management (Signed)
 Inpatient Diabetes Program Recommendations  AACE/ADA: New Consensus Statement on Inpatient Glycemic Control   Target Ranges:  Prepandial:   less than 140 mg/dL      Peak postprandial:   less than 180 mg/dL (1-2 hours)      Critically ill patients:  140 - 180 mg/dL    Latest Reference Range & Units 09/26/24 08:22 09/26/24 11:19 09/26/24 16:20 09/26/24 19:41 09/26/24 21:36 09/27/24 07:46  Glucose-Capillary 70 - 99 mg/dL 94 866 (H) 83 69 (L) 84 77   Review of Glycemic Control  Diabetes history: DM2 Outpatient Diabetes medications: Metformin  1500 mg daily, Tresiba  30 units daily (prescribed 40 units but was rationing insulin ; ran out several weeks ago and can not afford to get filled) Current orders for Inpatient glycemic control: Lantus  45 units daily, Novolog  12 units TID with meals, Novolog  0-15 units TID with meals, Novolog  0-5 units at bedtime, Metformin  XR 1500 mg QPM  Inpatient Diabetes Program Recommendations:    Insulin : CBGs 69-133 mg/dl on 87/83 and 77 mg/dl this morning. Please consider decreasing Lantus  to 40 units daily and meal coverage to Novolog  8 units TID with meals.  Discharge Recommendations: Long acting recommendations: Insulin  Glargine (LANTUS ) Solostar Pen 40 units daily  Short acting recommendations:  Meal + Correction coverage Insulin  lispro (HUMALOG ) KwikPen  Moderate Scale.  10 units TID with meals plus correction Supply/Referral recommendations: Pen needles - standard   Use Adult Diabetes Insulin  Treatment Post Discharge order set.  Thanks, Earnie Gainer, RN, MSN, CDCES Diabetes Coordinator Inpatient Diabetes Program [phone] (Team Pager from 8am to 5pm)

## 2024-09-27 NOTE — Plan of Care (Signed)
  Problem: Education: Goal: Ability to describe self-care measures that may prevent or decrease complications (Diabetes Survival Skills Education) will improve Outcome: Progressing Goal: Individualized Educational Video(s) Outcome: Progressing   Problem: Coping: Goal: Ability to adjust to condition or change in health will improve Outcome: Progressing   Problem: Fluid Volume: Goal: Ability to maintain a balanced intake and output will improve Outcome: Progressing   Problem: Health Behavior/Discharge Planning: Goal: Ability to identify and utilize available resources and services will improve Outcome: Progressing Goal: Ability to manage health-related needs will improve Outcome: Progressing   Problem: Metabolic: Goal: Ability to maintain appropriate glucose levels will improve Outcome: Progressing   Problem: Nutritional: Goal: Maintenance of adequate nutrition will improve Outcome: Progressing Goal: Progress toward achieving an optimal weight will improve Outcome: Progressing   Problem: Skin Integrity: Goal: Risk for impaired skin integrity will decrease Outcome: Progressing   Problem: Tissue Perfusion: Goal: Adequacy of tissue perfusion will improve Outcome: Progressing   Problem: Education: Goal: Knowledge of General Education information will improve Description: Including pain rating scale, medication(s)/side effects and non-pharmacologic comfort measures Outcome: Progressing   Problem: Health Behavior/Discharge Planning: Goal: Ability to manage health-related needs will improve Outcome: Progressing   Problem: Clinical Measurements: Goal: Ability to maintain clinical measurements within normal limits will improve Outcome: Progressing Goal: Will remain free from infection Outcome: Progressing Goal: Diagnostic test results will improve Outcome: Progressing Goal: Respiratory complications will improve Outcome: Progressing Goal: Cardiovascular complication will  be avoided Outcome: Progressing   Problem: Activity: Goal: Risk for activity intolerance will decrease Outcome: Progressing   Problem: Nutrition: Goal: Adequate nutrition will be maintained Outcome: Progressing   Problem: Coping: Goal: Level of anxiety will decrease Outcome: Progressing   Problem: Elimination: Goal: Will not experience complications related to bowel motility Outcome: Progressing Goal: Will not experience complications related to urinary retention Outcome: Progressing   Problem: Pain Managment: Goal: General experience of comfort will improve and/or be controlled Outcome: Progressing   Problem: Safety: Goal: Ability to remain free from injury will improve Outcome: Progressing   Problem: Skin Integrity: Goal: Risk for impaired skin integrity will decrease Outcome: Progressing   Problem: Education: Goal: Knowledge of disease and its progression will improve Outcome: Progressing Goal: Individualized Educational Video(s) Outcome: Progressing   Problem: Fluid Volume: Goal: Compliance with measures to maintain balanced fluid volume will improve Outcome: Progressing   Problem: Health Behavior/Discharge Planning: Goal: Ability to manage health-related needs will improve Outcome: Progressing   Problem: Nutritional: Goal: Ability to make healthy dietary choices will improve Outcome: Progressing   Problem: Clinical Measurements: Goal: Complications related to the disease process, condition or treatment will be avoided or minimized Outcome: Progressing

## 2024-09-27 NOTE — Progress Notes (Signed)
 [facility]  - [place] [facility]   PATIENT NAME: Shawn Rivas    MR#:  [id number]  DATE OF BIRTH:  02/16/1966  SUBJECTIVE:  no family at bedside. Saw patient with RN in the room. He gets very area table in between conversation. Has long explanations for previous surgeries. Discussed discharge planning he wants to go home. Tells me he is getting some help from friend and making sure duration home health has been ordered which I have told TOC has done it.    VITALS:  Blood pressure 132/72, pulse 92, temperature 98.1 F (36.7 C), resp. rate 18, height 5' 10 (1.778 m), weight 106.9 kg, SpO2 99%.  PHYSICAL EXAMINATION:   GENERAL:  58 y.o.-year-old patient with no acute distress.  LUNGS: Normal breath sounds bilaterally, no wheezing CARDIOVASCULAR: S1, S2 normal. No murmur   ABDOMEN: Soft, nontender, nondistended. Bowel sounds present.  EXTREMITIES: left lower extremity chronic venous stasis changes.Skin looks stable. No discharge NEUROLOGIC: nonfocal  patient is alert and awake   LABORATORY PANEL:  CBC Recent Labs  Lab 09/23/24 0523  WBC 8.9  HGB 13.8  HCT 40.7  PLT 324    Chemistries  Recent Labs  Lab 09/26/24 0900  NA 135  K 4.3  CL 102  CO2 22  GLUCOSE 95  BUN 28*  CREATININE 1.23  CALCIUM  9.1    Assessment and Plan  Per H&P HPI Shawn Rivas is a 58 y.o. year old male with medical history of HTN, HLD, T2DM presenting to the ED after a fall.   Periprosthetic fracture of L greater trochanter of femur --Manage nonoperatively per Ortho Dr Ezra --  Weightbearing as tolerated.  Pain control.  Patient has wounds on left foot that causes him significant pain with ambulation.  He is supposed to get a cast or boot for the left foot to help manage pain.  Patient is declining snf, has CAM boot to help offload pain from diabetic plantar ulcer on L foot.  -Can discharge home with [facility] - Has cam boot delivered, plan for HHPT   T2DM complicated by  chronic foot ulcers --Uncontrolled A1c 12.7 2 months ago.  Patient is currently not taking his insulin  but is taking metformin .  Blood sugars are better controlled.  His a.m. blood sugar was 190 on BMP. --decreased his Lantus  due to sugars stable and not eating the best  - Foot ulcers do not appear infected he sees wound care outpatient for this, and wound care consulted here - Patient will need to discharge on insulin , likely just long acting. He has not been able to afford tresiba  that was sent for him.    HLD Not currently on statin  LDL 158 6 months ago.  Will need to discuss this with his PCP on discharge.   AKI on CKD2 Resolved.  --With significant microalbuminuria. Encouraged adherence with antidiabetes medication.  --On ARB. Serum creatinine of 1.2 -Will resume ARB in the AM given scr improved and decrease amlodipine       HTN Resume patients arb (home med) and d/c amlodipine   Discussed with TOC regarding patient's needs for discharge planning. Patient will discharge tomorrow. Per TOC patient is getting his friend put in a ramp in his house. Home health has been arranged. Patient is aware he has declined to rehab   Family communication :none Consults :Ortho CODE STATUS: full DVT Prophylaxis :enoxaparin  Level of care: Med-Surg Status is: Inpatient Remains inpatient appropriate because: per toc discussion pt will d/c  tomorrow    TOTAL TIME TAKING CARE OF THIS PATIENT: 35 minutes.  >50% time spent on counselling and coordination of care  Note: This dictation was prepared with Dragon dictation along with smaller phrase technology. Any transcriptional errors that result from this process are unintentional.  Leita Blanch M.D    [facility]   CC: Primary care physician; Edman Marsa PARAS, DO

## 2024-09-27 NOTE — Progress Notes (Signed)
 Patient advised multiple times to mobilize, patient has refused all efforts to help him mobilize. He states he is in too much pain and cannot move. Patient also refused a full skin assessment, Rn unable to visualized the back of the patient die to refusing to turn on his side for the assessment.

## 2024-09-28 ENCOUNTER — Inpatient Hospital Stay

## 2024-09-28 ENCOUNTER — Other Ambulatory Visit: Payer: Self-pay

## 2024-09-28 DIAGNOSIS — Z96649 Presence of unspecified artificial hip joint: Secondary | ICD-10-CM | POA: Diagnosis not present

## 2024-09-28 DIAGNOSIS — M978XXA Periprosthetic fracture around other internal prosthetic joint, initial encounter: Secondary | ICD-10-CM | POA: Diagnosis not present

## 2024-09-28 LAB — GLUCOSE, CAPILLARY
Glucose-Capillary: 127 mg/dL — ABNORMAL HIGH (ref 70–99)
Glucose-Capillary: 126 mg/dL — ABNORMAL HIGH (ref 70–99)
Glucose-Capillary: 189 mg/dL — ABNORMAL HIGH (ref 70–99)
Glucose-Capillary: 96 mg/dL (ref 70–99)

## 2024-09-28 MED ORDER — INSULIN GLARGINE 100 UNIT/ML SOLOSTAR PEN
25.0000 [IU] | PEN_INJECTOR | Freq: Every day | SUBCUTANEOUS | 11 refills | Status: DC
  Filled 2024-09-28: qty 15, 60d supply, fill #0

## 2024-09-28 MED ORDER — FLUTICASONE PROPIONATE 50 MCG/ACT NA SUSP
1.0000 | Freq: Every day | NASAL | 2 refills | Status: DC
  Filled 2024-09-28: qty 16, 30d supply, fill #0

## 2024-09-28 MED ORDER — ROSUVASTATIN CALCIUM 20 MG PO TABS
20.0000 mg | ORAL_TABLET | Freq: Every day | ORAL | 1 refills | Status: DC
  Filled 2024-09-28: qty 30, 30d supply, fill #0

## 2024-09-28 MED ORDER — OXYCODONE HCL 5 MG PO TABS
5.0000 mg | ORAL_TABLET | Freq: Four times a day (QID) | ORAL | 0 refills | Status: DC | PRN
  Filled 2024-09-28: qty 20, 5d supply, fill #0

## 2024-09-28 MED ORDER — METFORMIN HCL ER 750 MG PO TB24
1500.0000 mg | ORAL_TABLET | Freq: Every day | ORAL | 0 refills | Status: DC
  Filled 2024-09-28: qty 180, 90d supply, fill #0

## 2024-09-28 MED ORDER — METHOCARBAMOL 500 MG PO TABS
500.0000 mg | ORAL_TABLET | Freq: Three times a day (TID) | ORAL | 0 refills | Status: DC | PRN
  Filled 2024-09-28: qty 30, 10d supply, fill #0

## 2024-09-28 MED ORDER — INSULIN PEN NEEDLE 32G X 4 MM MISC
1.0000 | 0 refills | Status: AC
  Filled 2024-09-28: qty 100, 90d supply, fill #0

## 2024-09-28 MED ORDER — SENNOSIDES-DOCUSATE SODIUM 8.6-50 MG PO TABS
1.0000 | ORAL_TABLET | Freq: Two times a day (BID) | ORAL | 0 refills | Status: DC
  Filled 2024-09-28: qty 60, 30d supply, fill #0

## 2024-09-28 MED ORDER — VALSARTAN 160 MG PO TABS
160.0000 mg | ORAL_TABLET | Freq: Every day | ORAL | 2 refills | Status: DC
  Filled 2024-09-28: qty 30, 30d supply, fill #0

## 2024-09-28 MED ORDER — POLYETHYLENE GLYCOL 3350 17 GM/SCOOP PO POWD
17.0000 g | Freq: Every day | ORAL | 0 refills | Status: AC
  Filled 2024-09-28: qty 238, 14d supply, fill #0

## 2024-09-28 NOTE — Progress Notes (Signed)
 PT Cancellation Note  Patient Details Name: Shawn Rivas MRN: [id number] DOB: [birth year]   Cancelled Treatment:    Reason Eval/Treat Not Completed: Other (comment).  Pt on BSC with call bell in reach and reporting he is not ready and needs more time for BM. Will re-attempt at later date/time as pt is available and schedule permits.     Fonda Simpers, PT, DPT Physical Therapist - [facility]  09/28/2024, 4:29 PM

## 2024-09-28 NOTE — Inpatient Diabetes Management (Signed)
 Inpatient Diabetes Program Recommendations  AACE/ADA: New Consensus Statement on Inpatient Glycemic Control  Target Ranges:  Prepandial:   less than 140 mg/dL      Peak postprandial:   less than 180 mg/dL (1-2 hours)      Critically ill patients:  140 - 180 mg/dL    Latest Reference Range & Units 09/27/24 07:46 09/27/24 12:07 09/27/24 16:56 09/27/24 20:46 09/28/24 07:57  Glucose-Capillary 70 - 99 mg/dL 77 899 (H) 877 (H) 819 (H) 127 (H)   Review of Glycemic Control  Diabetes history: DM2 Outpatient Diabetes medications: Metformin  1500 mg daily, Tresiba  30 units daily (prescribed 40 units but was rationing insulin ; ran out several weeks ago and can not afford to get filled) Current orders for Inpatient glycemic control: Lantus  25 units daily, Novolog  0-15 units TID with meals, Novolog  0-5 units at bedtime, Metformin  XR 1500 mg QPM  Inpatient Diabetes Program Recommendations:    Outpatient DM: Per [facility] check, patient's insurance covers Lantus  and Humalog  insulin  and copay is $35 each.   Discharge Recommendations: Long acting recommendations: Insulin  Glargine (LANTUS ) Solostar Pen Dose to be determined  Short acting recommendations:  Correction coverage ONLY Insulin  lispro (HUMALOG ) KwikPen  Sensitive Scale.   Supply/Referral recommendations: Pen needles - standard   Use Adult Diabetes Insulin  Treatment Post Discharge order set.   Thanks, Earnie Gainer, RN, MSN, CDCES Diabetes Coordinator Inpatient Diabetes Program [phone] (Team Pager from 8am to 5pm)

## 2024-09-28 NOTE — Progress Notes (Signed)
 Patient's bed in an elevated position per patient request, when educated on the importance and safety of lowering the bed to the lowest position patient declines. Patient states that it is the only way he has been able to be comfortable today. Refusing to left staff lower the bed.

## 2024-09-28 NOTE — Progress Notes (Signed)
 Mobility Specialist - Progress Note   09/28/24 1430  Mobility  Activity Pivoted/transferred to/from BSC  Level of Assistance Moderate assist, patient does 50-74% (+2)  Assistive Device Front wheel walker  Distance Ambulated (ft) 2 ft  LLE Weight Bearing Per Provider Order NWB  Activity Response Tolerated well  Mobility visit 1 Mobility  Mobility Specialist Start Time (ACUTE ONLY) 1408  Mobility Specialist Stop Time (ACUTE ONLY) 1420  Mobility Specialist Time Calculation (min) (ACUTE ONLY) 12 min   Pt supine upon entry, utilizing RA. Pt required ModA-MinA +2 to transfer to the recliner, hoping on the RLE while remaining NWB on the LLE--- foot blocking required during STS. Pt left seated with NT present upon MS exit.  America Silvan Mobility Specialist 09/28/2024 3:11 PM

## 2024-09-28 NOTE — Plan of Care (Signed)
  Problem: Education: Goal: Ability to describe self-care measures that may prevent or decrease complications (Diabetes Survival Skills Education) will improve Outcome: Progressing Goal: Individualized Educational Video(s) Outcome: Progressing   Problem: Coping: Goal: Ability to adjust to condition or change in health will improve Outcome: Progressing   Problem: Fluid Volume: Goal: Ability to maintain a balanced intake and output will improve Outcome: Progressing   Problem: Health Behavior/Discharge Planning: Goal: Ability to identify and utilize available resources and services will improve Outcome: Progressing Goal: Ability to manage health-related needs will improve Outcome: Progressing   Problem: Metabolic: Goal: Ability to maintain appropriate glucose levels will improve Outcome: Progressing   Problem: Nutritional: Goal: Maintenance of adequate nutrition will improve Outcome: Progressing Goal: Progress toward achieving an optimal weight will improve Outcome: Progressing   Problem: Skin Integrity: Goal: Risk for impaired skin integrity will decrease Outcome: Progressing   Problem: Tissue Perfusion: Goal: Adequacy of tissue perfusion will improve Outcome: Progressing   Problem: Education: Goal: Knowledge of General Education information will improve Description: Including pain rating scale, medication(s)/side effects and non-pharmacologic comfort measures Outcome: Progressing   Problem: Health Behavior/Discharge Planning: Goal: Ability to manage health-related needs will improve Outcome: Progressing   Problem: Clinical Measurements: Goal: Ability to maintain clinical measurements within normal limits will improve Outcome: Progressing Goal: Will remain free from infection Outcome: Progressing Goal: Diagnostic test results will improve Outcome: Progressing Goal: Respiratory complications will improve Outcome: Progressing Goal: Cardiovascular complication will  be avoided Outcome: Progressing   Problem: Activity: Goal: Risk for activity intolerance will decrease Outcome: Progressing   Problem: Nutrition: Goal: Adequate nutrition will be maintained Outcome: Progressing   Problem: Coping: Goal: Level of anxiety will decrease Outcome: Progressing   Problem: Elimination: Goal: Will not experience complications related to bowel motility Outcome: Progressing Goal: Will not experience complications related to urinary retention Outcome: Progressing   Problem: Pain Managment: Goal: General experience of comfort will improve and/or be controlled Outcome: Progressing   Problem: Safety: Goal: Ability to remain free from injury will improve Outcome: Progressing   Problem: Skin Integrity: Goal: Risk for impaired skin integrity will decrease Outcome: Progressing   Problem: Education: Goal: Knowledge of disease and its progression will improve Outcome: Progressing Goal: Individualized Educational Video(s) Outcome: Progressing   Problem: Fluid Volume: Goal: Compliance with measures to maintain balanced fluid volume will improve Outcome: Progressing   Problem: Health Behavior/Discharge Planning: Goal: Ability to manage health-related needs will improve Outcome: Progressing   Problem: Nutritional: Goal: Ability to make healthy dietary choices will improve Outcome: Progressing   Problem: Clinical Measurements: Goal: Complications related to the disease process, condition or treatment will be avoided or minimized Outcome: Progressing

## 2024-09-28 NOTE — TOC Progression Note (Signed)
 Transition of Care [facility]) - Progression Note    Patient Details  Name: Shawn Rivas MRN: [id number] Date of Birth: [birth year]  Transition of Care [facility]) CM/SW Contact  Alvaro Louder, [place] Phone Number: 09/28/2024, 4:27 PM  Clinical Narrative:    LCSWA met the patient at the bedside to discuss discharge planning. Patient initially indicated that he wanted to go home with [facility]. After the patient had conversation with family the patient indicated that going to rehab would be the best option for him. LCSWA faxed out patient to SNF's in [place]. TOC to present facilities to patient at the bedside tomorrow.   TOC to follow for discharge   Expected Discharge Plan: Home w Home Health Services Barriers to Discharge: Continued Medical Work up               Expected Discharge Plan and Services   Discharge Planning Services: CM Consult   Living arrangements for the past 2 months: Mobile Home Expected Discharge Date: 09/28/24                                     Social Drivers of Health (SDOH) Interventions SDOH Screenings   Food Insecurity: No Food Insecurity (09/19/2024)  Housing: Low Risk (09/19/2024)  Transportation Needs: No Transportation Needs (09/19/2024)  Utilities: Not At Risk (09/19/2024)  Depression (PHQ2-9): Low Risk (05/15/2024)  Financial Resource Strain: Low Risk  (06/29/2024)   Received from [facility]  Physical Activity: Inactive (03/21/2024)  Social Connections: Moderately Isolated (09/19/2024)  Stress: Stress Concern Present (03/21/2024)  Tobacco Use: Medium Risk (09/19/2024)  Health Literacy: Adequate Health Literacy (03/21/2024)    Readmission Risk Interventions     No data to display

## 2024-09-28 NOTE — Progress Notes (Signed)
 OT Cancellation Note  Patient Details Name: Shawn Rivas MRN: [id number] DOB: 12-27-1965   Cancelled Treatment:    Reason Eval/Treat Not Completed: Other (comment). On first attempt, pt on the phone with contractor for ramp being built at his home. On 2nd attempt, pt on [facility] with call bell in reach and reporting he is not ready and needs more time for BM. Will re-attempt at later date/time as pt is available and schedule permits.   [name] R., MPH, MS, OTR/L ascom [phone] 09/28/2024, 2:38 PM

## 2024-09-28 NOTE — NC FL2 (Cosign Needed)
 [place]  MEDICAID FL2 LEVEL OF CARE FORM     IDENTIFICATION  Patient Name: Shawn Rivas Birthdate: August 25, 1966 Sex: male Admission Date (Current Location): 09/19/2024  [facility] and [place] Number:  Chiropodist and Address:  [facility], [address] [place], [place] [address]      Provider Number: [id number]  Attending Physician Name and Address:  Tobie Calix, MD  Relative Name and Phone Number:       Current Level of Care: Hospital Recommended Level of Care: Skilled Nursing Facility Prior Approval Number:    Date Approved/Denied:   PASRR Number: [id number]  Discharge Plan: SNF    Current Diagnoses: Patient Active Problem List   Diagnosis Date Noted   Periprosthetic intertrochanteric fracture of femur, initial encounter 09/19/2024   Post-traumatic osteoarthritis 01/29/2023   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 determined by examination associated with type 2 diabetes mellitus (HCC) 08/26/2022   Nonproliferative diabetic retinopathy of right eye (HCC) 08/26/2022   Hyperlipidemia associated with type 2 diabetes mellitus (HCC) 02/20/2022   Chronic venous insufficiency 09/30/2020   Hyperlipidemia 09/30/2020   Essential hypertension 09/30/2020   Peripheral vascular disease 08/27/2020   Long-term insulin  use (HCC) 05/27/2020   Non compliance w medication regimen 05/27/2020   Obesity, Class I, BMI 30-34.9 05/25/2018   Diabetic polyneuropathy associated with type 2 diabetes mellitus (HCC) 12/13/2017   Vaccine counseling 08/04/2017   Chronic pain due to trauma 06/09/2016   ASHD (arteriosclerotic heart disease) 09/14/2014   Type 2 diabetes mellitus with other specified complication (HCC) 09/14/2014    Orientation RESPIRATION BLADDER Height & Weight     Self, Time, Situation, Place  Normal Continent Weight: 236 lb 5.3 oz (107.2 kg) Height:  5' 10 (177.8 cm)  BEHAVIORAL  SYMPTOMS/MOOD NEUROLOGICAL BOWEL NUTRITION STATUS      Continent Diet  AMBULATORY STATUS COMMUNICATION OF NEEDS Skin   Limited Assist Verbally Bruising, Skin abrasions (Diabetic Ulcer Left foot)                       Personal Care Assistance Level of Assistance  Bathing, Feeding, Dressing Bathing Assistance: Limited assistance Feeding assistance: Independent Dressing Assistance: Limited assistance     Functional Limitations Info  Sight, Hearing, Speech Sight Info: Impaired Hearing Info: Adequate Speech Info: Adequate    SPECIAL CARE FACTORS FREQUENCY  OT (By licensed OT), PT (By licensed PT)     PT Frequency: 5x/week OT Frequency: 5x/week            Contractures      Additional Factors Info  Code Status, Allergies Code Status Info: Full Allergies Info: Dilaudid (Hydromorphone Hcl), Hydromorphone, Other           Current Medications (09/28/2024):  This is the current hospital active medication list Current Facility-Administered Medications  Medication Dose Route Frequency Provider Last Rate Last Admin   acetaminophen  (TYLENOL ) tablet 650 mg  650 mg Oral Q6H PRN Khan, Ghalib, MD   650 mg at 09/24/24 1706   Or   acetaminophen  (TYLENOL ) suppository 650 mg  650 mg Rectal Q6H PRN Khan, Ghalib, MD       ALPRAZolam  (XANAX ) tablet 0.5 mg  0.5 mg Oral BID PRN Franchot Novel, MD   0.5 mg at 09/27/24 1157   alum & mag hydroxide-simeth (MAALOX/MYLANTA) 200-200-20 MG/5ML suspension 30 mL  30 mL Oral Q4H PRN Franchot Novel, MD   30 mL at 09/24/24 1830   diphenhydrAMINE  (  BENADRYL ) capsule 25 mg  25 mg Oral BID PRN Franchot Novel, MD   25 mg at 09/27/24 2143   enoxaparin  (LOVENOX ) injection 55 mg  0.5 mg/kg Subcutaneous Q24H Franchot Novel, MD   55 mg at 09/28/24 0911   fluticasone  (FLONASE ) 50 MCG/ACT nasal spray 1 spray  1 spray Each Nare Daily Franchot Novel, MD   1 spray at 09/25/24 1548   insulin  aspart (novoLOG ) injection 0-15 Units  0-15 Units  Subcutaneous TID WC Khan, Ghalib, MD   2 Units at 09/28/24 1302   insulin  aspart (novoLOG ) injection 0-5 Units  0-5 Units Subcutaneous QHS Khan, Ghalib, MD   2 Units at 09/20/24 2315   insulin  glargine (LANTUS ) injection 25 Units  25 Units Subcutaneous Daily Patel, Sona, MD   25 Units at 09/28/24 9093   irbesartan  (AVAPRO ) tablet 150 mg  150 mg Oral Daily Franchot Novel, MD   150 mg at 09/28/24 9090   lactulose  (CHRONULAC ) 10 GM/15ML solution 20 g  20 g Oral Once Franchot Novel, MD       lidocaine  (LIDODERM ) 5 % 1 patch  1 patch Transdermal Q24H Ernest Ronal BRAVO, MD   1 patch at 09/27/24 1722   melatonin tablet 2.5 mg  2.5 mg Oral QHS Khan, Ghalib, MD   2.5 mg at 09/24/24 2235   metFORMIN  (GLUCOPHAGE -XR) 24 hr tablet 1,500 mg  1,500 mg Oral Q breakfast Franchot Novel, MD   1,500 mg at 09/28/24 9094   methocarbamol  (ROBAXIN ) tablet 500 mg  500 mg Oral Q8H PRN Patel, Sona, MD   500 mg at 09/28/24 9372   ondansetron  (ZOFRAN -ODT) disintegrating tablet 4 mg  4 mg Oral Q6H PRN Franchot Novel, MD   4 mg at 09/26/24 1426   Oral care mouth rinse  15 mL Mouth Rinse PRN Franchot Novel, MD       oxyCODONE  (Oxy IR/ROXICODONE ) immediate release tablet 5 mg  5 mg Oral Q4H PRN Franchot Novel, MD   5 mg at 09/25/24 2012   Or   oxyCODONE  (Oxy IR/ROXICODONE ) immediate release tablet 10 mg  10 mg Oral Q4H PRN Franchot Novel, MD   10 mg at 09/28/24 1249   polyethylene glycol (MIRALAX  / GLYCOLAX ) packet 17 g  17 g Oral Daily Franchot Novel, MD       promethazine (PHENERGAN) 12.5 mg in sodium chloride  0.9 % 50 mL IVPB  12.5 mg Intravenous Q6H PRN Duncan, Hazel V, MD       pseudoephedrine  (SUDAFED) 12 hr tablet 120 mg  120 mg Oral Daily PRN Franchot Novel, MD       rosuvastatin  (CRESTOR ) tablet 20 mg  20 mg Oral Daily Khan, Ghalib, MD   20 mg at 09/28/24 9090   senna-docusate (Senokot-S) tablet 1 tablet  1 tablet Oral BID Franchot Novel, MD   1 tablet at 09/28/24 0910   sodium chloride  (OCEAN)  0.65 % nasal spray 1 spray  1 spray Each Nare PRN Franchot Novel, MD       sodium chloride  flush (NS) 0.9 % injection 3 mL  3 mL Intravenous Q12H Khan, Ghalib, MD   3 mL at 09/28/24 9089     Discharge Medications: Please see discharge summary for a list of discharge medications.  Relevant Imaging Results:  Relevant Lab Results:   Additional Information SSN: [id number]  Soila Printup  [place], LCSW

## 2024-09-28 NOTE — Progress Notes (Signed)
 Patient asks for an XR and orders were placed by provider, XR went to the room x 2 and were unsuccessful in their attempts. The first time patient was on the bedside commode and the second time the patient refused XR due to pain. He states since it is too early for his PRN pain medication he will not be doing the XR until later on.

## 2024-09-28 NOTE — Plan of Care (Signed)
°  Problem: Education: Goal: Ability to describe self-care measures that may prevent or decrease complications (Diabetes Survival Skills Education) will improve Outcome: Progressing   Problem: Coping: Goal: Ability to adjust to condition or change in health will improve Outcome: Progressing   Problem: Metabolic: Goal: Ability to maintain appropriate glucose levels will improve Outcome: Progressing   Problem: Nutrition: Goal: Adequate nutrition will be maintained Outcome: Progressing   Problem: Coping: Goal: Level of anxiety will decrease Outcome: Progressing

## 2024-09-28 NOTE — Progress Notes (Signed)
 [facility]  - [place] at [facility]   PATIENT NAME: Shawn Rivas    MR#:  [id number]  DATE OF BIRTH:  1965-12-11  SUBJECTIVE:  no family at bedside. Saw patient with RN in the room. Patient is still insisting wants to see ortho Shawn Rivas. He is trying to get his friend build a ramp in the house. Discussed with him medically otherwise he stable for discharge. He gets irritable very easily.  VITALS:  Blood pressure 121/71, pulse 98, temperature 98.2 F (36.8 C), resp. rate 17, height 5' 10 (1.778 m), weight 107.2 kg, SpO2 91%.  PHYSICAL EXAMINATION:   GENERAL:  58 y.o.-year-old patient with no acute distress.  LUNGS: Normal breath sounds bilaterally, no wheezing CARDIOVASCULAR: S1, S2 normal. No murmur   ABDOMEN: Soft, nontender, nondistended. Bowel sounds present.  EXTREMITIES: left lower extremity chronic venous stasis changes.Skin looks stable. No discharge NEUROLOGIC: nonfocal  patient is alert and awake   LABORATORY PANEL:  CBC Recent Labs  Lab 09/23/24 0523  WBC 8.9  HGB 13.8  HCT 40.7  PLT 324    Chemistries  Recent Labs  Lab 09/26/24 0900  NA 135  K 4.3  CL 102  CO2 22  GLUCOSE 95  BUN 28*  CREATININE 1.23  CALCIUM  9.1    Assessment and Plan  Per H&P HPI Shawn Rivas is a 58 y.o. year old male with medical history of HTN, HLD, T2DM presenting to the ED after a fall.   Periprosthetic fracture of L greater trochanter of femur --Manage nonoperatively per Ortho Dr Ezra --  Weightbearing as tolerated.  Pain control.  Patient has wounds on left foot that causes him significant pain with ambulation.  He is supposed to get a cast or boot for the left foot to help manage pain.  Patient is declining snf, has CAM boot to help offload pain from diabetic plantar ulcer on L foot.  -[facility] - Has cam boot delivered, plan for HHPT -- messaged Dr. Ezra and Shawn Level PA-- to see the patient since is requesting. --  X-ray pelvis ordered   T2DM complicated by chronic foot ulcers --Uncontrolled A1c 12.7 2 months ago.  Patient is currently not taking his insulin  but is taking metformin .  Blood sugars are better controlled.  His a.m. blood sugar was 190 on BMP. --decreased his Lantus  due to sugars stable and not eating the best  - Foot ulcers do not appear infected he sees wound care outpatient for this, and wound care consult noted -resumed insulin  Lantus  pen and will prescribe on discharge  --A1c is 10.1  HLD Not currently on statin  LDL 158 6 months ago.  Will need to discuss this with his PCP on discharge.   AKI on CKD2 Resolved.  --With significant microalbuminuria. Encouraged adherence with antidiabetes medication.  --On ARB. Serum creatinine of 1.2 -- resumed     HTN Resume patients arb (home med) and d/c amlodipine   Discussed with TOC regarding patient's needs for discharge planning. Per TOC patient is getting his friend put in a ramp in his house. Home health has been arranged. Patient is aware he has declined to rehab D/c orders placed   Family communication :none Consults :Ortho CODE STATUS: full DVT Prophylaxis :enoxaparin  Rivas of care: Med-Surg Status is: Inpatient  TOTAL TIME TAKING CARE OF THIS PATIENT: 35 minutes.  >50% time spent on counselling and coordination of care  Note: This dictation was prepared with Dragon dictation along  with smaller phrase technology. Any transcriptional errors that result from this process are unintentional.  Shawn Rivas M.D    [facility]   CC: Primary care physician; Shawn Marsa PARAS, DO

## 2024-09-28 NOTE — Plan of Care (Signed)
°  Problem: Coping: Goal: Ability to adjust to condition or change in health will improve Outcome: Progressing   Problem: Metabolic: Goal: Ability to maintain appropriate glucose levels will improve Outcome: Progressing   Problem: Nutritional: Goal: Maintenance of adequate nutrition will improve Outcome: Progressing   Problem: Tissue Perfusion: Goal: Adequacy of tissue perfusion will improve Outcome: Progressing   Problem: Education: Goal: Knowledge of General Education information will improve Description: Including pain rating scale, medication(s)/side effects and non-pharmacologic comfort measures Outcome: Progressing   Problem: Coping: Goal: Level of anxiety will decrease Outcome: Progressing   Problem: Elimination: Goal: Will not experience complications related to bowel motility Outcome: Progressing Goal: Will not experience complications related to urinary retention Outcome: Progressing   Problem: Pain Managment: Goal: General experience of comfort will improve and/or be controlled Outcome: Progressing   Problem: Safety: Goal: Ability to remain free from injury will improve Outcome: Progressing   Problem: Skin Integrity: Goal: Risk for impaired skin integrity will decrease Outcome: Progressing

## 2024-09-29 DIAGNOSIS — M978XXA Periprosthetic fracture around other internal prosthetic joint, initial encounter: Secondary | ICD-10-CM | POA: Diagnosis not present

## 2024-09-29 DIAGNOSIS — Z96649 Presence of unspecified artificial hip joint: Secondary | ICD-10-CM | POA: Diagnosis not present

## 2024-09-29 LAB — GLUCOSE, CAPILLARY
Glucose-Capillary: 143 mg/dL — ABNORMAL HIGH (ref 70–99)
Glucose-Capillary: 136 mg/dL — ABNORMAL HIGH (ref 70–99)
Glucose-Capillary: 124 mg/dL — ABNORMAL HIGH (ref 70–99)
Glucose-Capillary: 148 mg/dL — ABNORMAL HIGH (ref 70–99)

## 2024-09-29 NOTE — Progress Notes (Signed)
 [facility]  - [place] at [facility]   PATIENT NAME: Shawn Rivas    MR#:  [id number]  DATE OF BIRTH:  12/06/1965  SUBJECTIVE:  no family at bedside Pt now wants to go to Rehab. HE was seen by Dr Ezra today VITALS:  Blood pressure (!) 153/100, pulse (!) 103, temperature 97.9 F (36.6 C), resp. rate 20, height 5' 10 (1.778 m), weight 107.2 kg, SpO2 95%.  PHYSICAL EXAMINATION:   GENERAL:  58 y.o.-year-old patient with no acute distress.  LUNGS: Normal breath sounds bilaterally, no wheezing CARDIOVASCULAR: S1, S2 normal. No murmur   ABDOMEN: Soft, nontender, nondistended. Bowel sounds present.  EXTREMITIES: left lower extremity chronic venous stasis changes.Skin looks stable. No discharge NEUROLOGIC: nonfocal  patient is alert and awake   LABORATORY PANEL:  CBC Recent Labs  Lab 09/23/24 0523  WBC 8.9  HGB 13.8  HCT 40.7  PLT 324    Chemistries  Recent Labs  Lab 09/26/24 0900  NA 135  K 4.3  CL 102  CO2 22  GLUCOSE 95  BUN 28*  CREATININE 1.23  CALCIUM  9.1    Assessment and Plan  Per H&P HPI Shawn Rivas is a 58 y.o. year old male with medical history of HTN, HLD, T2DM presenting to the ED after a fall.   Periprosthetic fracture of L greater trochanter of femur --Manage nonoperatively per Ortho Dr Ezra --  Weightbearing as tolerated.  Pain control.  Patient has wounds on left foot that causes him significant pain with ambulation.  He is supposed to get a cast or boot for the left foot to help manage pain.  Patient is declining snf, has CAM boot to help offload pain from diabetic plantar ulcer on L foot.  -Can discharge home with [facility] - Has cam boot delivered, plan for HHPT -- messaged Dr. Ezra and [name] PA-- pt seen by Ortho today--cont present care   T2DM complicated by chronic foot ulcers --Uncontrolled A1c 12.7 2 months ago.  Patient is currently not taking his insulin  but is taking metformin .  Blood sugars are better  controlled.  His a.m. blood sugar was 190 on BMP. --decreased his Lantus  due to sugars stable and not eating the best  - Foot ulcers do not appear infected he sees wound care outpatient for this, and wound care consult noted -resumed insulin  Lantus  pen and will prescribe on discharge  --A1c is 10.1  HLD Not currently on statin  LDL 158 6 months ago.  Will need to discuss this with his PCP on discharge.   AKI on CKD2 Resolved.  --With significant microalbuminuria. Encouraged adherence with antidiabetes medication.  --On ARB. Serum creatinine of 1.2     HTN --Resume patients arb (home med)   Pt was offered Rehab to begin with--he refused it and Now wants to go to rehab. He is medically best at baseline for discharge. D/C orders have been placed on 09/18/2024   Family communication :none Consults :Ortho CODE STATUS: full DVT Prophylaxis :enoxaparin  Level of care: Med-Surg   TOTAL TIME TAKING CARE OF THIS PATIENT: 35 minutes.  >50% time spent on counselling and coordination of care  Note: This dictation was prepared with Dragon dictation along with smaller phrase technology. Any transcriptional errors that result from this process are unintentional.  Leita Blanch M.D    [facility]   CC: Primary care physician; [place] Marsa PARAS, DO

## 2024-09-29 NOTE — TOC Progression Note (Signed)
 Transition of Care [facility]) - Progression Note    Patient Details  Name: SANTANNA OLENIK MRN: [id number] Date of Birth: [birth year]  [facility]) CM/SW Contact  Alvaro Louder, [place] Phone Number: 09/29/2024, 2:47 PM  Clinical Narrative:   Shara approved for patient to admit to [facility] tomorrow morning. Per SNF admin Tammy.   Approved [id number] Dates: 12/19-12/23/2025 Next Review Date: 10/03/2024   Expected Discharge Plan: Home w Home Health Services Barriers to Discharge: Continued Medical Work up               Expected Discharge Plan and Services   Discharge Planning Services: CM Consult   Living arrangements for the past 2 months: Mobile Home Expected Discharge Date: 09/28/24                                     Social Drivers of Health (SDOH) Interventions SDOH Screenings   Food Insecurity: No Food Insecurity (09/19/2024)  Housing: Low Risk (09/19/2024)  Transportation Needs: No Transportation Needs (09/19/2024)  Utilities: Not At Risk (09/19/2024)  Depression (PHQ2-9): Low Risk (05/15/2024)  Financial Resource Strain: Low Risk  (06/29/2024)   Received from [facility]  Physical Activity: Inactive (03/21/2024)  Social Connections: Moderately Isolated (09/19/2024)  Stress: Stress Concern Present (03/21/2024)  Tobacco Use: Medium Risk (09/19/2024)  Health Literacy: Adequate Health Literacy (03/21/2024)    Readmission Risk Interventions     No data to display

## 2024-09-29 NOTE — Telephone Encounter (Signed)
 Requested Prescriptions  Pending Prescriptions Disp Refills   naproxen  (NAPROSYN ) 500 MG tablet [Pharmacy Med Name: NAPROXEN  500 MG TABLET] 180 tablet 0    Sig: TAKE 1 TABLET (500 MG TOTAL) BY MOUTH 2 (TWO) TIMES DAILY WITH A MEAL. FOR 2-4 WEEKS THEN AS NEEDED     Analgesics:  NSAIDS Failed - 09/29/2024  1:36 PM      Failed - Manual Review: Labs are only required if the patient has taken medication for more than 8 weeks.      Passed - Cr in normal range and within 360 days    Creat  Date Value Ref Range Status  03/14/2024 1.43 (H) 0.70 - 1.30 mg/dL Final   Creatinine, Ser  Date Value Ref Range Status  09/26/2024 1.23 0.61 - 1.24 mg/dL Final   Creatinine, Urine  Date Value Ref Range Status  03/14/2024 66 20 - 320 mg/dL Final         Passed - HGB in normal range and within 360 days    Hemoglobin  Date Value Ref Range Status  09/23/2024 13.8 13.0 - 17.0 g/dL Final   HGB  Date Value Ref Range Status  08/20/2014 13.0 13.0 - 18.0 g/dL Final         Passed - PLT in normal range and within 360 days    Platelets  Date Value Ref Range Status  09/23/2024 324 150 - 400 K/uL Final   Platelet  Date Value Ref Range Status  08/20/2014 256 150 - 440 x10 3/mm 3 Final         Passed - HCT in normal range and within 360 days    HCT  Date Value Ref Range Status  09/23/2024 40.7 39.0 - 52.0 % Final  08/19/2014 39.7 (L) 40.0 - 52.0 % Final         Passed - eGFR is 30 or above and within 360 days    EGFR (African American)  Date Value Ref Range Status  08/20/2014 >60 >84mL/min Final   EGFR (Non-African Amer.)  Date Value Ref Range Status  08/20/2014 >60 >12mL/min Final    Comment:    eGFR values <53mL/min/1.73 m2 may be an indication of chronic kidney disease (CKD). Calculated eGFR, using the MRDR Study equation, is useful in  patients with stable renal function. The eGFR calculation will not be reliable in acutely ill patients when serum creatinine is changing rapidly. It is  not useful in patients on dialysis. The eGFR calculation may not be applicable to patients at the low and high extremes of body sizes, pregnant women, and vegetarians.    GFR, Estimated  Date Value Ref Range Status  09/26/2024 >60 >60 mL/min Final    Comment:    (NOTE) Calculated using the CKD-EPI Creatinine Equation (2021)    eGFR  Date Value Ref Range Status  03/14/2024 57 (L) > OR = 60 mL/min/1.17m2 Final         Passed - Patient is not pregnant      Passed - Valid encounter within last 12 months    Recent Outpatient Visits           2 months ago Type 2 diabetes mellitus with other specified complication, with long-term current use of insulin  [facility])   [facility] [place], Marsa PARAS, DO   4 months ago 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 associated with type 2 diabetes mellitus, with muscle involvement without evidence of necrosis (HCC)   [facility] [place], Marsa PARAS, DO   6 months ago Annual physical exam   [facility] [place], Marsa PARAS, [place]

## 2024-09-29 NOTE — Progress Notes (Signed)
 Mobility Specialist - Progress Note   09/29/24 1436  Mobility  Activity Pivoted/transferred to/from BSC  Level of Assistance Moderate assist, patient does 50-74%  Assistive Device Front wheel walker  Distance Ambulated (ft) 2 ft  LLE Weight Bearing Per Provider Order WBAT  Activity Response Tolerated well  Mobility visit 1 Mobility  Mobility Specialist Start Time (ACUTE ONLY) 1430  Mobility Specialist Stop Time (ACUTE ONLY) 1436  Mobility Specialist Time Calculation (min) (ACUTE ONLY) 6 min   Pt transferring EOB with NT upon entry, requiring MinA to bring trunk from sup to sit. Pt transferred to the [facility] via SPT ModA +2--- foot blocking of the RLE. Pt left seated on the [facility] with NT present.  America Silvan Mobility Specialist 09/29/2024 2:39 PM

## 2024-09-29 NOTE — Progress Notes (Signed)
 Subjective: Doing okay this morning.  Feeling somewhat discouraged by his lack of progress.  Continuing to have left hip pain and muscle spasms.   Objective: Vital signs in last 24 hours: Temp:  [98.2 F (36.8 C)-98.3 F (36.8 C)] 98.3 F (36.8 C) (12/19 0300) Pulse Rate:  [98-108] 99 (12/19 0300) Resp:  [17-18] 18 (12/19 0300) BP: (121-147)/(71-93) 138/88 (12/19 0300) SpO2:  [91 %-97 %] 97 % (12/19 0300)  Intake/Output from previous day: 12/18 0701 - 12/19 0700 In: 240 [P.O.:240] Out: 1600 [Urine:1600] Intake/Output this shift: Total I/O In: 120 [P.O.:120] Out: -   No results for input(s): HGB in the last 72 hours. No results for input(s): WBC, RBC, HCT, PLT in the last 72 hours. Recent Labs    09/26/24 0900  NA 135  K 4.3  CL 102  CO2 22  BUN 28*  CREATININE 1.23  GLUCOSE 95  CALCIUM  9.1   No results for input(s): LABPT, INR in the last 72 hours.  Physical Exam: LLE: Incision is well-healed and benign appearing.  No erythema, drainage, signs of infection.  No significant pain to palpation about the left hip.  He has some pain with attempted range of motion of the hip.  Sensation to light touch decreased to the distal aspect of the ankle and foot as is the patient's baseline.  Unable to wiggle his toes or plantar dorsiflex the ankle as per his baseline.  X-rays: Radiographs of the pelvis ordered yesterday were personally reviewed and interpreted.  Previously visualized periprosthetic fracture of the greater trochanter remains nondisplaced.  The stem appears stable and unchanged from previous radiographs.  Assessment: Left greater trochanteric periprosthetic fracture  Plan: - Continue closed management of periprosthetic fracture - Weightbearing as tolerated to the left lower extremity - Mobilize as tolerated - Continue to work with physical therapy - Discharge planning as per primary - Follow-up with orthopedics outpatient   Shawn Rivas 09/29/2024, 7:40 AM

## 2024-09-30 DIAGNOSIS — W19XXXA Unspecified fall, initial encounter: Secondary | ICD-10-CM

## 2024-09-30 LAB — GLUCOSE, CAPILLARY
Glucose-Capillary: 152 mg/dL — ABNORMAL HIGH (ref 70–99)
Glucose-Capillary: 110 mg/dL — ABNORMAL HIGH (ref 70–99)
Glucose-Capillary: 136 mg/dL — ABNORMAL HIGH (ref 70–99)
Glucose-Capillary: 117 mg/dL — ABNORMAL HIGH (ref 70–99)

## 2024-09-30 MED ORDER — ALPRAZOLAM 1 MG PO TABS: 1.0000 mg | ORAL_TABLET | Freq: Every day | ORAL | 0 refills | Status: DC | PRN

## 2024-09-30 MED ORDER — OXYCODONE HCL 5 MG PO TABS: 5.0000 mg | ORAL_TABLET | Freq: Four times a day (QID) | ORAL | 0 refills | Status: DC | PRN

## 2024-09-30 NOTE — Progress Notes (Signed)
 [facility] rescheduled for around 10pm with a new crew requested. Patient updated and said he is agreeable to transport with a different crew. Patient encouraged to stand and pivot onto stretcher instead of being pulled over to prevent any further incidents.

## 2024-09-30 NOTE — Discharge Summary (Addendum)
 " Physician Discharge Summary   Patient: Shawn Rivas MRN: [id number] DOB: 08-05-1966  Admit date:     09/19/2024  Discharge date: 09/30/2024  Discharge Physician: Amaryllis Dare   PCP: Edman Marsa PARAS, DO   Recommendations at discharge:  Please obtain CBC and BMP on follow-up Follow-up with orthopedic surgery Follow-up with primary care provider  Discharge Diagnoses: Principal Problem:   Periprosthetic intertrochanteric fracture of femur, initial encounter Active Problems:   Diabetic polyneuropathy associated with type 2 diabetes mellitus (HCC)   Type 2 diabetes mellitus with other specified complication (HCC)   Peripheral vascular disease   Hyperlipidemia associated with type 2 diabetes mellitus [facility])   Fall   Hospital Course: Shawn Rivas is a 58 y.o. year old male with medical history of HTN, HLD, T2DM presenting to the ED after a fall.  He was found to have periprosthetic fracture of left greater trochanter of femur.  Orthopedic surgery was consulted and they were advising nonoperative management.  Patient will be weightbearing as tolerated.  Need pain control.  Patient was also found to have a wound on left foot which interfere with ambulation.  He was given a boot to have help with the pain and offset the weight.  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  No concern of infection at this time. PT initially recommended SNF but patient decided to go home with home health, later changed his mind and agreed to go to SNF for rehab.  Patient should be taking regular MiraLAX  and senna to avoid constipation while taking pain medications.  Patient was also found to have uncontrolled type 2 diabetes mellitus with hyperglycemia and A1c of 12.13-month ago.  He was taking metformin , stopped taking Lantus  which was resumed.  Patient was also found to have AKI with underlying CKD stage II which was resolved with IV fluid.  Patient also  has an history of hypertension and will continue home medications on discharge.  Patient will continue on current medications and need to have a close follow-up with his providers for further assistance.   Pain control - [place]  Controlled Substance Reporting System database was reviewed. and patient was instructed, not to drive, operate heavy machinery, perform activities at heights, swimming or participation in water activities or provide baby-sitting services while on Pain, Sleep and Anxiety Medications; until their outpatient Physician has advised to do so again. Also recommended to not to take more than prescribed Pain, Sleep and Anxiety Medications.  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   Dexcom G7 Sensor Misc Use 1 sensor every 10 days to check glucose continuously as advised   fluticasone  50 MCG/ACT nasal spray Commonly known as: FLONASE  Place 1 spray into  both nostrils daily.   Insulin  Pen Needle 32G X 4 MM Misc Use 1 each as directed with insulin .   Lantus  SoloStar 100 UNIT/ML Solostar Pen Generic drug: insulin  glargine Inject 25 Units into the skin daily.   metFORMIN  750 MG 24 hr tablet Commonly known as: GLUCOPHAGE -XR Take 2 tablets (1,500 mg total) by mouth daily with breakfast.   methocarbamol  500 MG tablet Commonly known as: ROBAXIN  Take 1 tablet (500 mg total) by mouth every 8 (eight) hours as needed for muscle spasms.   OneTouch Delica Plus Lancet30G Misc Use to check blood sugar up to four times daily as directed   OneTouch Verio Flex System w/Device Kit Use to check blood sugar up to four times daily as directed   OneTouch Verio test strip Generic drug: glucose blood Use to check blood sugar up to four times daily as directed   oxyCODONE  5 MG immediate release tablet Commonly known as: Oxy IR/ROXICODONE  Take 1 tablet (5 mg total) by mouth every 6 (six) hours as needed for severe pain (pain score 7-10). What changed:  how much to take when to take this   polyethylene glycol powder 17 GM/SCOOP powder Commonly known as: GLYCOLAX /MIRALAX  Take 17 g by mouth daily. Dissolve 1 capful (17g) in 4-8 ounces of liquid and take by mouth daily.   rosuvastatin  20 MG tablet Commonly known as: CRESTOR  Take 1 tablet (20 mg total) by mouth daily.   senna-docusate 8.6-50 MG tablet Commonly known as: Senokot-S Take 1 tablet by mouth 2 (two) times daily.   valsartan  160 MG tablet Commonly known as: DIOVAN  Take 1 tablet (160 mg total) by mouth daily.               Durable Medical Equipment  (From admission, onward)           Start     Ordered   09/26/24 1938  For home use only DME standard manual wheelchair with seat cushion  Once       Comments: Patient suffers from L hip fracture and chronic l foot wounds which impairs their ability to perform daily activities like bathing and dressing in the home.  A walker  will not resolve issue with performing activities of daily living. A wheelchair will allow patient to safely perform daily activities. Patient can safely propel the wheelchair in the home or has a caregiver who can provide assistance. Length of need 6 months . Accessories: elevating leg rests (ELRs), wheel locks, extensions and anti-tippers.   09/26/24 1938              Discharge Care Instructions  (From admission, onward)           Start     Ordered   09/30/24 0000  Discharge wound care:       Comments: please change dressing to left plantar foot and left outer ankle Q WED/SAT/MON as follows: Cut piece of Aquacel Soila # [phone]) and apply to both wounds each time, then cover with foam dressing.  Moisten previous dressings with NS to assist with removal each  time. Change dressing daily.   09/30/24 1058   09/28/24 0000  Discharge wound care:       Comments: 11/23/23 1000    Wound care  Daily      Comments: Bedside nurse; please change dressing to left plantar foot and left outer ankle Q WED/SAT/MON as follows: Cut piece of Aquacel Soila # [phone]) and apply to both wounds each time, then cover with foam dressing.  Moisten previous dressings with NS to assist with removal each time.  09/20/24 1010   09/19/24 1347    Apply NonToxic Wound Cleanser to any open wound.  Once   09/28/24 1047            Contact information for follow-up providers     Edman Marsa PARAS, DO. Schedule an appointment as soon as possible for a visit on 10/17/2024.   Specialty: Family Medicine Why: hospital f/u at 220 Contact information: [address] [place] [address] [phone]         Ezra Jackquline RAMAN, MD. Schedule an appointment as soon as possible for a visit in 1 week(s).   Specialty: Orthopedic Surgery Why: left hip fracture f/u Contact information: [address] [place] [address] [phone]              Contact information for after-discharge care      Destination     [facility] [place], [place]. [place]   Service: Skilled Nursing Contact information: [address] [place]  [address] [phone]             Home Medical Care     [facility] .   Service: Home Health Services Contact information: [phone] [place]  [address] [phone]                    Discharge Exam: Fredricka Weights   09/26/24 0437 09/27/24 0432 09/28/24 0436  Weight: 107.5 kg 106.9 kg 107.2 kg   General.  Obese gentleman, in no acute distress. Pulmonary.  Lungs clear bilaterally, normal respiratory effort. CV.  Regular rate and rhythm, no JVD, rub or murmur. Abdomen.  Soft, nontender, nondistended, BS positive. CNS.  Alert and oriented .  No focal neurologic deficit. Extremities.  No edema, no cyanosis, pulses intact and symmetrical. Psychiatry.  Judgment and insight appears normal.   Condition at discharge: stable  The results of significant diagnostics from this hospitalization (including imaging, microbiology, ancillary and laboratory) are listed below for reference.   Imaging Studies: DG Pelvis Portable Result Date: 09/28/2024 EXAM: 1 or 2 VIEW(S) XRAY OF THE PELVIS 09/28/2024 05:39:00 PM COMPARISON: CT 09/19/2024. CLINICAL HISTORY: Pain. FINDINGS: BONES AND JOINTS: Left total hip arthroplasty is noted. ORIF of the left acetabulum with extensive hardware is noted. Mild degenerative changes of the right hip are present. A known left periprosthetic femoral fracture is not well appreciated on this exam. SOFT TISSUES: IVC filter is in place. IMPRESSION: 1. Left total hip arthroplasty and ORIF of the left acetabulum with extensive hardware. 2. Known left periprosthetic femoral fracture is not well appreciated on this exam. 3. Mild degenerative changes of the right hip. Electronically signed by: Oneil Devonshire MD 09/28/2024 09:16 PM EST RP Workstation: [id number]   CT Cervical Spine Wo  Contrast Result Date: 09/19/2024 EXAM: CT CERVICAL SPINE WITHOUT CONTRAST 09/19/2024 03:44:15 PM TECHNIQUE: CT of the cervical spine was performed without the administration of intravenous contrast. Multiplanar reformatted images are provided for review. Automated exposure control, iterative reconstruction,  and/or weight based adjustment of the mA/kV was utilized to reduce the radiation dose to as low as reasonably achievable. COMPARISON: 11/07/2021 CLINICAL HISTORY: Ataxia, cervical trauma FINDINGS: CERVICAL SPINE: BONES AND ALIGNMENT: Straightening of the normal cervical lordosis. No acute fracture or traumatic malalignment. DEGENERATIVE CHANGES: Anterior endplate osteophytes most pronounced at C5-C6. There is no high grade osseous spinal canal stenosis. SOFT TISSUES: Atherosclerotic calcifications at the carotid bifurcations bilaterally. No prevertebral soft tissue swelling. IMPRESSION: 1. No acute abnormality of the cervical spine. Electronically signed by: Donnice Mania MD 09/19/2024 04:16 PM EST RP Workstation: [id number]   CT PELVIS WO CONTRAST Result Date: 09/19/2024 CLINICAL DATA:  Status post fall.  Left hip pain. EXAM: CT PELVIS WITHOUT CONTRAST TECHNIQUE: Multidetector CT imaging of the pelvis was performed following the standard protocol without intravenous contrast. RADIATION DOSE REDUCTION: This exam was performed according to the departmental dose-optimization program which includes automated exposure control, adjustment of the mA and/or kV according to patient size and/or use of iterative reconstruction technique. COMPARISON:  Pelvic and left hip radiographs same date and 11/07/2021. FINDINGS: Left thigh/femur findings dictated separately. BONES/JOINT/CARTILAGE Status post left acetabular and posterior column reconstruction and left total hip arthroplasty. The hardware appears intact, without loosening. There are posttraumatic deformities of the left acetabulum. There is a mildly displaced  periprosthetic fracture of the proximal left femur, involving the greater trochanter. No evidence of dislocation or acute pelvic fracture. Mild sacroiliac degenerative changes bilaterally with bridging osteophytes anteriorly on the left. LIGAMENTS Ligaments are suboptimally evaluated by CT. MUSCLES AND TENDONS Asymmetric left hip muscular atrophy attributed to prior trauma and disuse. No acute abnormality identified. SOFT TISSUES No acute internal pelvic findings are identified. Aortoiliac atherosclerosis and IVC filter noted. No evidence of periarticular fluid collection, unexpected foreign body or soft tissue emphysema. IMPRESSION: 1. Mildly displaced periprosthetic fracture of the proximal left femur, involving the greater trochanter. 2. No evidence of dislocation or acute pelvic fracture. 3. Posttraumatic deformities of the left acetabulum with previous reconstruction and left total hip arthroplasty. 4. Left thigh/femur findings dictated separately. 5.  Aortic Atherosclerosis (ICD10-I70.0). Electronically Signed   By: Elsie Perone M.D.   On: 09/19/2024 16:12   CT HEAD WO CONTRAST ( ) Result Date: 09/19/2024 EXAM: CT HEAD WITHOUT 09/19/2024 03:44:15 PM TECHNIQUE: CT of the head was performed without the administration of intravenous contrast. Automated exposure control, iterative reconstruction, and/or weight based adjustment of the mA/kV was utilized to reduce the radiation dose to as low as reasonably achievable. COMPARISON: 06/03/2024 CLINICAL HISTORY: Head trauma, abnormal mental status (Age 43-64y) FINDINGS: BRAIN AND VENTRICLES: No acute intracranial hemorrhage. No mass effect or midline shift. No extra-axial fluid collection. No evidence of acute infarct. Cerebral ventricle sizes concordant with degree of cerebral volume loss. No hydrocephalus. ORBITS: No acute abnormality. SINUSES AND MASTOIDS: No acute abnormality. SOFT TISSUES AND SKULL: No acute skull fracture. No acute soft tissue abnormality.  IMPRESSION: 1. No acute intracranial abnormality. Electronically signed by: Donnice Mania MD 09/19/2024 04:11 PM EST RP Workstation: [id number]   CT FEMUR LEFT WO CONTRAST Result Date: 09/19/2024 CLINICAL DATA:  Fracture, femur Status post fall. Left hip shortening and rotation. Remote surgery. EXAM: CT OF THE LOWER LEFT EXTREMITY WITHOUT CONTRAST TECHNIQUE: Multidetector CT imaging of the left femur was performed according to the standard protocol. RADIATION DOSE REDUCTION: This exam was performed according to the departmental dose-optimization program which includes automated exposure control, adjustment of the mA and/or kV according to patient size and/or use of iterative reconstruction technique. COMPARISON:  Pelvic and left hip radiographs 09/19/2024 and 11/07/2021. No comparison CT. FINDINGS: Bones/Joint/Cartilage Extensive postsurgical changes from previous left acetabular and posterior column reconstruction. The hardware is intact. Patient is status post left total hip arthroplasty. There is a distal left femoral lateral plate and screws and a proximal tibial cannulated screw, inserted laterally. No evidence of hardware loosening or displacement. There is an acute appearing, mildly displaced periprosthetic fracture of the greater trochanter. No other evidence of acute fracture or dislocation. Extensive posttraumatic deformities of the left acetabulum, distal femur and proximal lower leg. Advanced medial and lateral compartment degenerative changes at the left knee without significant joint effusion. Calcification along the medial aspect of the distal femur, likely related to remote MCL injury. Ligaments Suboptimally assessed by CT.  As above, probable remote MCL injury. Muscles and Tendons Mild generalized muscular atrophy, especially distally in the thigh. More extensive fatty atrophy within the visualized proximal lower leg. Soft tissues No acute soft tissue findings are identified. Specifically, no  evidence of focal fluid collection, unexpected foreign body or soft tissue emphysema. Scattered vascular calcifications. IMPRESSION: 1. Acute appearing, mildly displaced periprosthetic fracture of the left greater trochanter. 2. No other evidence of acute fracture or dislocation. See separate pelvic CT report. 3. Extensive postsurgical changes from previous left acetabular and posterior column reconstruction. Additional fixation hardware in distal thigh and proximal lower leg as described. 4. Advanced medial and lateral compartment degenerative changes at the left knee. Electronically Signed   By: Elsie Perone M.D.   On: 09/19/2024 16:06   DG Hip Unilat With Pelvis 2-3 Views Left Result Date: 09/19/2024 CLINICAL DATA:  Status post fall with left hip deformity EXAM: DG HIP (WITH OR WITHOUT PELVIS) 3V LEFT COMPARISON:  Left hip radiographs dated 11/07/2021 FINDINGS: Postsurgical changes of the left acetabulum and left hip arthroplasty. Similar superior acetabular and left pelvic sidewall heterotopic ossification. Fracture fixation hardware appears intact. No acute displaced periprosthetic fracture or dislocation. Partially imaged IVC filter projects over L4. IMPRESSION: Postsurgical changes of the left acetabulum and left hip arthroplasty. No acute displaced periprosthetic fracture or dislocation. Electronically Signed   By: Limin  Xu M.D.   On: 09/19/2024 15:41    Microbiology: Results for orders placed or performed in visit on 05/29/24  Microscopic Examination     Status: Abnormal   Collection Time: 05/29/24  9:09 AM   Urine  Result Value Ref Range Status   WBC, UA 0-5 0 - 5 /hpf Final   RBC, Urine 3-10 (A) 0 - 2 /hpf Final   Epithelial Cells (non renal) 0-10 0 - 10 /hpf Final   Casts Present (A) None seen /lpf Final   Cast Type Granular casts (A) N/A Final   Bacteria, UA Few None seen/Few Final  CULTURE, URINE COMPREHENSIVE     Status: None   Collection Time: 05/29/24  9:58 AM   Specimen:  Urine   UR  Result Value Ref Range Status   Urine Culture, Comprehensive Final report  Final   Organism ID, Bacteria Comment  Final    Comment: No growth in 36 - 48 hours.    Labs: CBC: No results for input(s): WBC, NEUTROABS, HGB, HCT, MCV, PLT in the last 168 hours. Basic Metabolic Panel: Recent Labs  Lab 09/24/24 0847 09/25/24 0522 09/26/24 0900  NA 134* 135 135  K 4.4 4.4 4.3  CL 102 102 102  CO2 18* 22 22  GLUCOSE 194* 115* 95  BUN 44* 38* 28*  CREATININE 1.49* 1.43* 1.23  CALCIUM  9.0 9.3 9.1   Liver Function Tests: No results for input(s): AST, ALT, ALKPHOS, BILITOT, PROT, ALBUMIN in the last 168 hours. CBG: Recent Labs  Lab 09/29/24 1215 09/29/24 1634 09/29/24 2323 09/30/24 0801 09/30/24 1217  GLUCAP 136* 124* 148* 152* 110*    Discharge time spent: greater than 30 minutes.  This record has been created using Conservation officer, historic buildings. Errors have been sought and corrected,but may not always be located. Such creation errors do not reflect on the standard of care.   Signed: Amaryllis Dare, MD [facility] 09/30/2024 "

## 2024-09-30 NOTE — Progress Notes (Addendum)
 1559- [facility] here to pick up patient and transport to [facility].   [phone]- Encouraged patient to stand and pivot into stretcher, patient declined and requested staff to pull him over. [facility] and RN transferred patient from bed to stretcher however upon transfer, the stretcher mattress moved and patient was halfway on mattress and halfway on the stretcher itself. Decision was made to transfer patient back to bed to reposition the mattress properly. Sliding board was placed for transfer back. Upon returning patient back to bed, patient refusing additional transfer until pain medication was provided. 10mg  Oxycodone  administered at 1614. After administration, patient got loud and irritable, demanding all staff to leave the room as he didn't trust the current team in the room. Patient was educated that if [facility] is called later today for a transfer, the same team will likely respond. Patient understood. PRN Xanax  administered and all staff left patients room. Call bell within reach. Dr. Caleen notified of patient refusing transport at this time.

## 2024-09-30 NOTE — Progress Notes (Signed)
 Report called to Shawn Rivas, receiving nurse at [facility]. All questions answered. PIV removed. Patient awaiting transport via ambulance.

## 2024-09-30 NOTE — Plan of Care (Signed)
" °  Problem: Coping: Goal: Ability to adjust to condition or change in health will improve Outcome: Progressing   Problem: Metabolic: Goal: Ability to maintain appropriate glucose levels will improve Outcome: Progressing   Problem: Nutritional: Goal: Maintenance of adequate nutrition will improve Outcome: Progressing Goal: Progress toward achieving an optimal weight will improve Outcome: Progressing   Problem: Skin Integrity: Goal: Risk for impaired skin integrity will decrease Outcome: Progressing   Problem: Education: Goal: Knowledge of General Education information will improve Description: Including pain rating scale, medication(s)/side effects and non-pharmacologic comfort measures Outcome: Progressing   Problem: Activity: Goal: Risk for activity intolerance will decrease Outcome: Progressing   Problem: Nutrition: Goal: Adequate nutrition will be maintained Outcome: Progressing   Problem: Elimination: Goal: Will not experience complications related to bowel motility Outcome: Progressing   "

## 2024-09-30 NOTE — TOC Transition Note (Addendum)
 Transition of Care [facility]) - Discharge Note   Patient Details  Name: Shawn Rivas MRN: [id number] Date of Birth: 1965-11-17  Transition of [facility]) CM/SW Contact:  Baker Kogler L Marykay Mccleod, LCSW Phone Number: 09/30/2024, 5:49 PM   Clinical Narrative:     Discharge Summary in. [facility] was called and transportation to [facility] was scheduled. Secure chat received from RN stating that patient refused to be transported after an incident with [facility]. [facility] rescheduled and a different team was requested.   Daughter notified of discharge and transport. CSW advised daughter of the incident with [facility]. Regarding the incident, patient was not properly positioned on the stretcher and as a safety measure he was placed back onto the bed. He was not harmed.   No further TOC needs. TOC signing off.     Barriers to Discharge: Continued Medical Work up   Patient Goals and CMS Choice Patient states their goals for this hospitalization and ongoing recovery are:: Does not want to go to SNF but expresses not being safe at home at this time CMS Medicare.gov Compare Post Acute Care list provided to:: Patient Choice offered to / list presented to : Patient      Discharge Placement                       Discharge Plan and Services Additional resources added to the After Visit Summary for     Discharge Planning Services: CM Consult                                 Social Drivers of Health (SDOH) Interventions SDOH Screenings   Food Insecurity: No Food Insecurity (09/19/2024)  Housing: Low Risk (09/19/2024)  Transportation Needs: No Transportation Needs (09/19/2024)  Utilities: Not At Risk (09/19/2024)  Depression (PHQ2-9): Low Risk (05/15/2024)  Financial Resource Strain: Low Risk  (06/29/2024)   Received from [facility]  Physical Activity: Inactive (03/21/2024)  Social Connections: Moderately Isolated (09/19/2024)  Stress: Stress Concern Present (03/21/2024)   Tobacco Use: Medium Risk (09/19/2024)  Health Literacy: Adequate Health Literacy (03/21/2024)     Readmission Risk Interventions     No data to display

## 2024-10-02 ENCOUNTER — Telehealth: Payer: Self-pay

## 2024-10-02 DIAGNOSIS — E1142 Type 2 diabetes mellitus with diabetic polyneuropathy: Secondary | ICD-10-CM

## 2024-10-02 DIAGNOSIS — Z794 Long term (current) use of insulin: Secondary | ICD-10-CM

## 2024-10-02 DIAGNOSIS — E785 Hyperlipidemia, unspecified: Secondary | ICD-10-CM

## 2024-10-02 DIAGNOSIS — G8929 Other chronic pain: Secondary | ICD-10-CM

## 2024-10-02 DIAGNOSIS — K5900 Constipation, unspecified: Secondary | ICD-10-CM

## 2024-10-02 DIAGNOSIS — F419 Anxiety disorder, unspecified: Secondary | ICD-10-CM

## 2024-10-02 DIAGNOSIS — M7542 Impingement syndrome of left shoulder: Secondary | ICD-10-CM

## 2024-10-02 DIAGNOSIS — I10 Essential (primary) hypertension: Secondary | ICD-10-CM

## 2024-10-02 DIAGNOSIS — J3089 Other allergic rhinitis: Secondary | ICD-10-CM

## 2024-10-02 MED ORDER — SENNOSIDES-DOCUSATE SODIUM 8.6-50 MG PO TABS: 1.0000 | ORAL_TABLET | Freq: Two times a day (BID) | ORAL | 1 refills | Status: AC

## 2024-10-02 MED ORDER — METFORMIN HCL ER 750 MG PO TB24: 1500.0000 mg | ORAL_TABLET | Freq: Every day | ORAL | 1 refills | Status: AC

## 2024-10-02 MED ORDER — OXYCODONE HCL 5 MG PO TABS: 5.0000 mg | ORAL_TABLET | ORAL | 0 refills | Status: DC | PRN

## 2024-10-02 MED ORDER — INSULIN GLARGINE 100 UNIT/ML SOLOSTAR PEN: 25.0000 [IU] | PEN_INJECTOR | Freq: Every day | SUBCUTANEOUS | 11 refills | Status: DC

## 2024-10-02 MED ORDER — ROSUVASTATIN CALCIUM 20 MG PO TABS: 20.0000 mg | ORAL_TABLET | Freq: Every day | ORAL | 1 refills | Status: AC

## 2024-10-02 MED ORDER — ALPRAZOLAM 1 MG PO TABS: 1.0000 mg | ORAL_TABLET | Freq: Every day | ORAL | 0 refills | Status: AC | PRN

## 2024-10-02 MED ORDER — METHOCARBAMOL 500 MG PO TABS: 500.0000 mg | ORAL_TABLET | Freq: Three times a day (TID) | ORAL | 2 refills | Status: AC | PRN

## 2024-10-02 MED ORDER — VALSARTAN 160 MG PO TABS: 160.0000 mg | ORAL_TABLET | Freq: Every day | ORAL | 1 refills | Status: AC

## 2024-10-02 MED ORDER — FLUTICASONE PROPIONATE 50 MCG/ACT NA SUSP: 1.0000 | Freq: Every day | NASAL | 2 refills | Status: AC

## 2024-10-02 NOTE — Telephone Encounter (Signed)
 All refills sent to [facility] 10/02/24  Marsa Officer, DO [facility] 10/02/2024, 4:54 PM

## 2024-10-02 NOTE — Telephone Encounter (Signed)
 Unfortunately this is not something that I can handle. I don't have jurisdiction to control what happens at the rehab facility.  It looks like has an apt with me tomorrow on 12/23, but if he cannot make it to that it - they also scheduled him for January 6th, 10/17/24 for Hospital Follow-up.   Marsa Officer, DO [facility] 10/02/2024, 1:20 PM

## 2024-10-02 NOTE — Telephone Encounter (Signed)
 Copied from CRM #[id number]. Topic: Clinical - Medical Advice >> Oct 02, 2024 12:29 PM Tysheama G wrote: Reason for CRM: Patient wants Dr.K to call him ASAP. Stated he is getting thrown out of a rehab facility and he's been in there for 2weeks and they have not done any PT with him yet. And he stated it's too many doctors in the situation that doesn't know what they are doing. Callback number [phone]

## 2024-10-02 NOTE — Telephone Encounter (Signed)
 Spoke to patient, he is coming to his appointment tomorrow 10/03/24. He stated I left rehab because they would not let him do PT.  Notified him that Dr Edman has the message on pain medication.

## 2024-10-02 NOTE — Addendum Note (Signed)
 Addended by: EDMAN MARSA PARAS on: 10/02/2024 04:54 PM   Modules accepted: Orders

## 2024-10-03 ENCOUNTER — Telehealth: Payer: Self-pay

## 2024-10-03 ENCOUNTER — Ambulatory Visit: Admitting: Family Medicine

## 2024-10-03 ENCOUNTER — Encounter: Payer: Self-pay | Admitting: Family Medicine

## 2024-10-03 VITALS — BP 140/90 | Ht 70.0 in | Wt 236.0 lb

## 2024-10-03 DIAGNOSIS — Z794 Long term (current) use of insulin: Secondary | ICD-10-CM

## 2024-10-03 DIAGNOSIS — E1165 Type 2 diabetes mellitus with hyperglycemia: Secondary | ICD-10-CM

## 2024-10-03 DIAGNOSIS — S72115D Nondisplaced fracture of greater trochanter of left femur, subsequent encounter for closed fracture with routine healing: Secondary | ICD-10-CM

## 2024-10-03 DIAGNOSIS — I10 Essential (primary) hypertension: Secondary | ICD-10-CM | POA: Diagnosis not present

## 2024-10-03 DIAGNOSIS — E1140 Type 2 diabetes mellitus with diabetic neuropathy, unspecified: Secondary | ICD-10-CM

## 2024-10-03 DIAGNOSIS — E66811 Obesity, class 1: Secondary | ICD-10-CM | POA: Diagnosis not present

## 2024-10-03 DIAGNOSIS — F331 Major depressive disorder, recurrent, moderate: Secondary | ICD-10-CM

## 2024-10-03 DIAGNOSIS — E11621 Type 2 diabetes mellitus with foot ulcer: Secondary | ICD-10-CM | POA: Diagnosis not present

## 2024-10-03 DIAGNOSIS — E1142 Type 2 diabetes mellitus with diabetic polyneuropathy: Secondary | ICD-10-CM

## 2024-10-03 DIAGNOSIS — L97525 Non-pressure chronic ulcer of other part of left foot with muscle involvement without evidence of necrosis: Secondary | ICD-10-CM | POA: Diagnosis not present

## 2024-10-03 NOTE — Telephone Encounter (Signed)
 He was asking about a Life Alert system when I went in to help with the Dexcom. Is this something we can arrange for him.

## 2024-10-03 NOTE — Telephone Encounter (Signed)
 Routing to [employer] LPN  Did you try to reach patient? I did not call him directly this afternoon.  Marsa Officer, DO [facility] 10/03/2024, 6:48 PM

## 2024-10-03 NOTE — Telephone Encounter (Signed)
 Understood. Regarding Life Alert, my usual response for this topic is to recommend that patients link up with their insurance carrier to identify if this service is offered or which brand/service is preferred or covered. Or may warrant 3rd party sign up for life alert system if not covered.  Will route this request to [place]  Mertel RN for [facility] discussion at upcoming call to check into options and assist with start-up for him.  Marsa Officer, DO [facility] 10/03/2024, 4:29 PM

## 2024-10-03 NOTE — Progress Notes (Signed)
 "  Subjective:    Patient ID: Shawn Rivas, male    DOB: 1966/01/04, 58 y.o.   MRN: [id number]  Shawn Rivas is a 58 y.o. male presenting on 10/03/2024 for Medical Management of Chronic Issues   HPI  Discussed the use of AI scribe software for clinical note transcription with the patient, who gave verbal consent to proceed.  History of Present Illness   Shawn Rivas is a 58 year old male who presents with concerns about home health care and medication management following a recent hip fracture.       HOSPITAL FOLLOW-UP VISIT  Hospital/Location: [facility] Date of Admission: 09/19/24 Date of Discharge: 09/30/24 Transitions of care telephone call: Completed today 10/03/24 Shawn Rivas Lanette Hamilton LPN  Reason for Admission: Left Hip Fracture  - Hospital H&P and Discharge Summary have been reviewed - Patient presents today 3 days after recent hospitalization.   Left Hip fracture and pain - Sustained a straight line fracture at the junction of the hip and femur on December 8th following a fall - Fracture deemed inoperable due to existing hardware from previous surgeries - Significant pain in the hip area, preventing ambulation - He expresses his concern over receiving opioids and strong pain medicine in hospital despite not having as much pain at times - He has been doing better with regards to pain at this point, mobility provokes pain. He has Oxycodone  and muscle relaxant Methocarbamol   Mobility limitations and assistive devices - Discharged with a heavy cam walker boot, which is painful and ill-fitting, preventing standing or walking - Unable to start physical therapy due to physician order requiring use of the boot while at SNF, he was discharged on 12/20 - Total contact cast was planned but is backordered until mid-January - Using crutches instead of a walker for better stability - He has wheelchair  Home health care and support - Home health care to be  managed by [facility], including home PT, OT, and wound care, but has not received necessary support - Scheduled to see a nurse from [facility] tomorrow to potentially restart services, 10/04/24 - No family support; daughter is not involved in care and lacks a network of friends or neighbors for assistance - Concerned about being alone at home without adequate support  Appetite and gastrointestinal symptoms - No appetite and eating minimally but stable at this time. He has his diabetes medication available. - Initial lack of bowel movements, improved with stool softeners  Medication management - Currently taking methocarbamol  500 mg as a muscle relaxer - Prescribed valsartan  160 mg for hypertension, increased from a previous lower dose - Taking rosuvastatin  for cholesterol management, switched from lovastatin   Orthopedic history - History of previous non-healing fracture successfully treated with ultrasound therapy several years ago  - New medications on discharge: Updated list - Changes to current meds on discharge: updated med rec  I have reviewed the discharge medication list, and have reconciled the current and discharge medications today.  Current Medications[1]  ------------------------------------------------------------------------- Social History[2]  Review of Systems Per HPI unless specifically indicated above     10/03/2024    2:06 PM 05/15/2024   11:46 AM 05/01/2024    4:19 PM  Depression screen PHQ 2/9  Decreased Interest 1 0 0  Down, Depressed, Hopeless 1 0 0  PHQ - 2 Score 2 0 0  Altered sleeping 0    Tired, decreased energy 1    Change in appetite 0    Feeling bad  or failure about yourself  0    Trouble concentrating 0    Moving slowly or fidgety/restless 0    Suicidal thoughts 0    PHQ-9 Score 3    Difficult doing work/chores Not difficult at all          Objective:    BP (!) 140/90 (BP Location: Left Arm, Patient Position: Sitting, Cuff  Size: Normal)   Ht 5' 10 (1.778 m)   Wt 236 lb (107 kg)   BMI 33.86 kg/m   Wt Readings from Last 3 Encounters:  10/03/24 236 lb (107 kg)  09/28/24 236 lb 5.3 oz (107.2 kg)  07/03/24 226 lb 4 oz (102.6 kg)    Physical Exam Vitals and nursing note reviewed.  Constitutional:      General: He is not in acute distress.    Appearance: He is well-developed. He is not diaphoretic.     Comments: Well-appearing, comfortable, cooperative  HENT:     Head: Normocephalic and atraumatic.     Ears:     Comments: No cerumen impaction. He has effusion bilateral L>R Eyes:     General:        Right eye: No discharge.        Left eye: No discharge.     Conjunctiva/sclera: Conjunctivae normal.     Pupils: Pupils are equal, round, and reactive to light.  Neck:     Thyroid : No thyromegaly.  Cardiovascular:     Rate and Rhythm: Normal rate and regular rhythm.     Pulses: Normal pulses.     Heart sounds: Normal heart sounds. No murmur heard. Pulmonary:     Effort: Pulmonary effort is normal. No respiratory distress.     Breath sounds: Normal breath sounds. No wheezing or rales.  Abdominal:     General: Bowel sounds are normal. There is no distension.     Palpations: Abdomen is soft. There is no mass.     Tenderness: There is no abdominal tenderness.  Musculoskeletal:        General: No tenderness. Normal range of motion.     Cervical back: Normal range of motion and neck supple.     Comments: Using walker  Limited mobility with range of motion low back lumbar spine and knees Notable muscle atrophy lower extremities and upper extremities. Muscle strength is 4/5 bilateral upper and lower extremities  Lymphadenopathy:     Cervical: No cervical adenopathy.  Skin:    General: Skin is warm and dry.     Findings: Lesion (left foot forefoot with healed ulceration) present. No erythema or rash.  Neurological:     Mental Status: He is alert and oriented to person, place, and time.     Comments:  Distal sensation intact to light touch all extremities  Psychiatric:        Mood and Affect: Mood normal.        Behavior: Behavior normal.        Thought Content: Thought content normal.     Comments: Well groomed, good eye contact, normal speech and thoughts     L foot     Results for orders placed or performed during the hospital encounter of 09/19/24  CBC with Differential   Collection Time: 09/19/24  5:43 PM  Result Value Ref Range   WBC 12.0 (H) 4.0 - 10.5 K/uL   RBC 5.07 4.22 - 5.81 MIL/uL   Hemoglobin 15.4 13.0 - 17.0 g/dL   HCT 56.5 60.9 - 47.9 %  MCV 85.6 80.0 - 100.0 fL   MCH 30.4 26.0 - 34.0 pg   MCHC 35.5 30.0 - 36.0 g/dL   RDW 87.3 88.4 - 84.4 %   Platelets 348 150 - 400 K/uL   nRBC 0.0 0.0 - 0.2 %   Neutrophils Relative % 78 %   Neutro Abs 9.4 (H) 1.7 - 7.7 K/uL   Lymphocytes Relative 13 %   Lymphs Abs 1.6 0.7 - 4.0 K/uL   Monocytes Relative 6 %   Monocytes Absolute 0.8 0.1 - 1.0 K/uL   Eosinophils Relative 1 %   Eosinophils Absolute 0.1 0.0 - 0.5 K/uL   Basophils Relative 1 %   Basophils Absolute 0.1 0.0 - 0.1 K/uL   Immature Granulocytes 1 %   Abs Immature Granulocytes 0.06 0.00 - 0.07 K/uL  Basic metabolic panel   Collection Time: 09/19/24  5:43 PM  Result Value Ref Range   Sodium 133 (L) 135 - 145 mmol/L   Potassium 4.1 3.5 - 5.1 mmol/L   Chloride 100 98 - 111 mmol/L   CO2 20 (L) 22 - 32 mmol/L   Glucose, Bld 366 (H) 70 - 99 mg/dL   BUN 27 (H) 6 - 20 mg/dL   Creatinine, Ser 8.87 0.61 - 1.24 mg/dL   Calcium  9.5 8.9 - 10.3 mg/dL   GFR, Estimated >39 >39 mL/min   Anion gap 13 5 - 15  Magnesium   Collection Time: 09/19/24  5:43 PM  Result Value Ref Range   Magnesium 1.9 1.7 - 2.4 mg/dL  Glucose, capillary   Collection Time: 09/19/24  9:43 PM  Result Value Ref Range   Glucose-Capillary 288 (H) 70 - 99 mg/dL   Comment 1 Notify RN   Basic metabolic panel   Collection Time: 09/20/24  5:44 AM  Result Value Ref Range   Sodium 134 (L) 135 -  145 mmol/L   Potassium 4.3 3.5 - 5.1 mmol/L   Chloride 99 98 - 111 mmol/L   CO2 22 22 - 32 mmol/L   Glucose, Bld 324 (H) 70 - 99 mg/dL   BUN 32 (H) 6 - 20 mg/dL   Creatinine, Ser 8.49 (H) 0.61 - 1.24 mg/dL   Calcium  9.3 8.9 - 10.3 mg/dL   GFR, Estimated 54 (L) >60 mL/min   Anion gap 13 5 - 15  CBC   Collection Time: 09/20/24  5:44 AM  Result Value Ref Range   WBC 9.0 4.0 - 10.5 K/uL   RBC 4.91 4.22 - 5.81 MIL/uL   Hemoglobin 14.9 13.0 - 17.0 g/dL   HCT 56.7 60.9 - 47.9 %   MCV 88.0 80.0 - 100.0 fL   MCH 30.3 26.0 - 34.0 pg   MCHC 34.5 30.0 - 36.0 g/dL   RDW 87.2 88.4 - 84.4 %   Platelets 344 150 - 400 K/uL   nRBC 0.0 0.0 - 0.2 %  HIV Antibody (routine testing w rflx)   Collection Time: 09/20/24  5:44 AM  Result Value Ref Range   HIV Screen 4th Generation wRfx Non Reactive Non Reactive  Glucose, capillary   Collection Time: 09/20/24  9:05 AM  Result Value Ref Range   Glucose-Capillary 292 (H) 70 - 99 mg/dL  Glucose, capillary   Collection Time: 09/20/24 12:05 PM  Result Value Ref Range   Glucose-Capillary 326 (H) 70 - 99 mg/dL  Glucose, capillary   Collection Time: 09/20/24  4:52 PM  Result Value Ref Range   Glucose-Capillary 248 (H) 70 - 99 mg/dL  Glucose, capillary   Collection Time: 09/20/24 11:12 PM  Result Value Ref Range   Glucose-Capillary 250 (H) 70 - 99 mg/dL  Basic metabolic panel with GFR   Collection Time: 09/21/24  4:32 AM  Result Value Ref Range   Sodium 132 (L) 135 - 145 mmol/L   Potassium 3.9 3.5 - 5.1 mmol/L   Chloride 99 98 - 111 mmol/L   CO2 20 (L) 22 - 32 mmol/L   Glucose, Bld 280 (H) 70 - 99 mg/dL   BUN 32 (H) 6 - 20 mg/dL   Creatinine, Ser 8.46 (H) 0.61 - 1.24 mg/dL   Calcium  8.8 (L) 8.9 - 10.3 mg/dL   GFR, Estimated 52 (L) >60 mL/min   Anion gap 13 5 - 15  CBC   Collection Time: 09/21/24  4:32 AM  Result Value Ref Range   WBC 9.2 4.0 - 10.5 K/uL   RBC 4.55 4.22 - 5.81 MIL/uL   Hemoglobin 13.9 13.0 - 17.0 g/dL   HCT 59.9 60.9 - 47.9 %    MCV 87.9 80.0 - 100.0 fL   MCH 30.5 26.0 - 34.0 pg   MCHC 34.8 30.0 - 36.0 g/dL   RDW 87.2 88.4 - 84.4 %   Platelets 318 150 - 400 K/uL   nRBC 0.0 0.0 - 0.2 %  Glucose, capillary   Collection Time: 09/21/24  8:25 AM  Result Value Ref Range   Glucose-Capillary 263 (H) 70 - 99 mg/dL  Glucose, capillary   Collection Time: 09/21/24 11:55 AM  Result Value Ref Range   Glucose-Capillary 294 (H) 70 - 99 mg/dL  Glucose, capillary   Collection Time: 09/21/24  4:28 PM  Result Value Ref Range   Glucose-Capillary 213 (H) 70 - 99 mg/dL  Glucose, capillary   Collection Time: 09/21/24  9:28 PM  Result Value Ref Range   Glucose-Capillary 131 (H) 70 - 99 mg/dL  Basic metabolic panel with GFR   Collection Time: 09/22/24  6:42 AM  Result Value Ref Range   Sodium 135 135 - 145 mmol/L   Potassium 3.8 3.5 - 5.1 mmol/L   Chloride 102 98 - 111 mmol/L   CO2 24 22 - 32 mmol/L   Glucose, Bld 190 (H) 70 - 99 mg/dL   BUN 38 (H) 6 - 20 mg/dL   Creatinine, Ser 8.36 (H) 0.61 - 1.24 mg/dL   Calcium  9.2 8.9 - 10.3 mg/dL   GFR, Estimated 49 (L) >60 mL/min   Anion gap 9 5 - 15  CBC   Collection Time: 09/22/24  6:42 AM  Result Value Ref Range   WBC 8.9 4.0 - 10.5 K/uL   RBC 4.48 4.22 - 5.81 MIL/uL   Hemoglobin 13.5 13.0 - 17.0 g/dL   HCT 59.3 60.9 - 47.9 %   MCV 90.6 80.0 - 100.0 fL   MCH 30.1 26.0 - 34.0 pg   MCHC 33.3 30.0 - 36.0 g/dL   RDW 87.2 88.4 - 84.4 %   Platelets 330 150 - 400 K/uL   nRBC 0.0 0.0 - 0.2 %  Glucose, capillary   Collection Time: 09/22/24  8:39 AM  Result Value Ref Range   Glucose-Capillary 188 (H) 70 - 99 mg/dL  Glucose, capillary   Collection Time: 09/22/24 11:57 AM  Result Value Ref Range   Glucose-Capillary 209 (H) 70 - 99 mg/dL  Glucose, capillary   Collection Time: 09/22/24  5:20 PM  Result Value Ref Range   Glucose-Capillary 117 (H) 70 - 99 mg/dL  Glucose,  capillary   Collection Time: 09/22/24  9:08 PM  Result Value Ref Range   Glucose-Capillary 135 (H) 70 -  99 mg/dL  Basic metabolic panel with GFR   Collection Time: 09/23/24  5:23 AM  Result Value Ref Range   Sodium 132 (L) 135 - 145 mmol/L   Potassium 3.9 3.5 - 5.1 mmol/L   Chloride 100 98 - 111 mmol/L   CO2 20 (L) 22 - 32 mmol/L   Glucose, Bld 191 (H) 70 - 99 mg/dL   BUN 46 (H) 6 - 20 mg/dL   Creatinine, Ser 8.34 (H) 0.61 - 1.24 mg/dL   Calcium  8.8 (L) 8.9 - 10.3 mg/dL   GFR, Estimated 48 (L) >60 mL/min   Anion gap 12 5 - 15  CBC   Collection Time: 09/23/24  5:23 AM  Result Value Ref Range   WBC 8.9 4.0 - 10.5 K/uL   RBC 4.57 4.22 - 5.81 MIL/uL   Hemoglobin 13.8 13.0 - 17.0 g/dL   HCT 59.2 60.9 - 47.9 %   MCV 89.1 80.0 - 100.0 fL   MCH 30.2 26.0 - 34.0 pg   MCHC 33.9 30.0 - 36.0 g/dL   RDW 87.3 88.4 - 84.4 %   Platelets 324 150 - 400 K/uL   nRBC 0.0 0.0 - 0.2 %  Glucose, capillary   Collection Time: 09/23/24  8:42 AM  Result Value Ref Range   Glucose-Capillary 162 (H) 70 - 99 mg/dL  Glucose, capillary   Collection Time: 09/23/24 12:04 PM  Result Value Ref Range   Glucose-Capillary 93 70 - 99 mg/dL  Glucose, capillary   Collection Time: 09/23/24  5:06 PM  Result Value Ref Range   Glucose-Capillary 201 (H) 70 - 99 mg/dL  Glucose, capillary   Collection Time: 09/23/24  8:41 PM  Result Value Ref Range   Glucose-Capillary 179 (H) 70 - 99 mg/dL  Glucose, capillary   Collection Time: 09/24/24  7:23 AM  Result Value Ref Range   Glucose-Capillary 196 (H) 70 - 99 mg/dL  Basic metabolic panel   Collection Time: 09/24/24  8:47 AM  Result Value Ref Range   Sodium 134 (L) 135 - 145 mmol/L   Potassium 4.4 3.5 - 5.1 mmol/L   Chloride 102 98 - 111 mmol/L   CO2 18 (L) 22 - 32 mmol/L   Glucose, Bld 194 (H) 70 - 99 mg/dL   BUN 44 (H) 6 - 20 mg/dL   Creatinine, Ser 8.50 (H) 0.61 - 1.24 mg/dL   Calcium  9.0 8.9 - 10.3 mg/dL   GFR, Estimated 54 (L) >60 mL/min   Anion gap 14 5 - 15  Glucose, capillary   Collection Time: 09/24/24 11:32 AM  Result Value Ref Range    Glucose-Capillary 180 (H) 70 - 99 mg/dL  Glucose, capillary   Collection Time: 09/24/24  4:49 PM  Result Value Ref Range   Glucose-Capillary 118 (H) 70 - 99 mg/dL  Glucose, capillary   Collection Time: 09/24/24  9:26 PM  Result Value Ref Range   Glucose-Capillary 96 70 - 99 mg/dL  Basic metabolic panel with GFR   Collection Time: 09/25/24  5:22 AM  Result Value Ref Range   Sodium 135 135 - 145 mmol/L   Potassium 4.4 3.5 - 5.1 mmol/L   Chloride 102 98 - 111 mmol/L   CO2 22 22 - 32 mmol/L   Glucose, Bld 115 (H) 70 - 99 mg/dL   BUN 38 (H) 6 - 20 mg/dL   Creatinine,  Ser 1.43 (H) 0.61 - 1.24 mg/dL   Calcium  9.3 8.9 - 10.3 mg/dL   GFR, Estimated 57 (L) >60 mL/min   Anion gap 11 5 - 15  Glucose, capillary   Collection Time: 09/25/24  8:04 AM  Result Value Ref Range   Glucose-Capillary 115 (H) 70 - 99 mg/dL  Glucose, capillary   Collection Time: 09/25/24 12:14 PM  Result Value Ref Range   Glucose-Capillary 169 (H) 70 - 99 mg/dL  Glucose, capillary   Collection Time: 09/25/24  6:23 PM  Result Value Ref Range   Glucose-Capillary 91 70 - 99 mg/dL  Glucose, capillary   Collection Time: 09/25/24  9:27 PM  Result Value Ref Range   Glucose-Capillary 77 70 - 99 mg/dL  Glucose, capillary   Collection Time: 09/26/24  8:22 AM  Result Value Ref Range   Glucose-Capillary 94 70 - 99 mg/dL  Hemoglobin J8r   Collection Time: 09/26/24  8:55 AM  Result Value Ref Range   Hgb A1c MFr Bld 10.5 (H) 4.8 - 5.6 %   Mean Plasma Glucose 254.65 mg/dL  Basic metabolic panel   Collection Time: 09/26/24  9:00 AM  Result Value Ref Range   Sodium 135 135 - 145 mmol/L   Potassium 4.3 3.5 - 5.1 mmol/L   Chloride 102 98 - 111 mmol/L   CO2 22 22 - 32 mmol/L   Glucose, Bld 95 70 - 99 mg/dL   BUN 28 (H) 6 - 20 mg/dL   Creatinine, Ser 8.76 0.61 - 1.24 mg/dL   Calcium  9.1 8.9 - 10.3 mg/dL   GFR, Estimated >39 >39 mL/min   Anion gap 11 5 - 15  Glucose, capillary   Collection Time: 09/26/24 11:19 AM   Result Value Ref Range   Glucose-Capillary 133 (H) 70 - 99 mg/dL  Glucose, capillary   Collection Time: 09/26/24  4:20 PM  Result Value Ref Range   Glucose-Capillary 83 70 - 99 mg/dL  Glucose, capillary   Collection Time: 09/26/24  7:41 PM  Result Value Ref Range   Glucose-Capillary 69 (L) 70 - 99 mg/dL  Glucose, capillary   Collection Time: 09/26/24  9:36 PM  Result Value Ref Range   Glucose-Capillary 84 70 - 99 mg/dL  Glucose, capillary   Collection Time: 09/27/24  7:46 AM  Result Value Ref Range   Glucose-Capillary 77 70 - 99 mg/dL  Glucose, capillary   Collection Time: 09/27/24 12:07 PM  Result Value Ref Range   Glucose-Capillary 100 (H) 70 - 99 mg/dL  Glucose, capillary   Collection Time: 09/27/24  4:56 PM  Result Value Ref Range   Glucose-Capillary 122 (H) 70 - 99 mg/dL  Glucose, capillary   Collection Time: 09/27/24  8:46 PM  Result Value Ref Range   Glucose-Capillary 180 (H) 70 - 99 mg/dL  Glucose, capillary   Collection Time: 09/28/24  7:57 AM  Result Value Ref Range   Glucose-Capillary 127 (H) 70 - 99 mg/dL  Glucose, capillary   Collection Time: 09/28/24 11:49 AM  Result Value Ref Range   Glucose-Capillary 126 (H) 70 - 99 mg/dL  Glucose, capillary   Collection Time: 09/28/24  4:14 PM  Result Value Ref Range   Glucose-Capillary 189 (H) 70 - 99 mg/dL  Glucose, capillary   Collection Time: 09/28/24  9:02 PM  Result Value Ref Range   Glucose-Capillary 96 70 - 99 mg/dL  Glucose, capillary   Collection Time: 09/29/24  9:27 AM  Result Value Ref Range   Glucose-Capillary 143 (  H) 70 - 99 mg/dL  Glucose, capillary   Collection Time: 09/29/24 12:15 PM  Result Value Ref Range   Glucose-Capillary 136 (H) 70 - 99 mg/dL  Glucose, capillary   Collection Time: 09/29/24  4:34 PM  Result Value Ref Range   Glucose-Capillary 124 (H) 70 - 99 mg/dL  Glucose, capillary   Collection Time: 09/29/24 11:23 PM  Result Value Ref Range   Glucose-Capillary 148 (H) 70 - 99  mg/dL  Glucose, capillary   Collection Time: 09/30/24  8:01 AM  Result Value Ref Range   Glucose-Capillary 152 (H) 70 - 99 mg/dL  Glucose, capillary   Collection Time: 09/30/24 12:17 PM  Result Value Ref Range   Glucose-Capillary 110 (H) 70 - 99 mg/dL  Glucose, capillary   Collection Time: 09/30/24  5:07 PM  Result Value Ref Range   Glucose-Capillary 136 (H) 70 - 99 mg/dL  Glucose, capillary   Collection Time: 09/30/24  9:34 PM  Result Value Ref Range   Glucose-Capillary 117 (H) 70 - 99 mg/dL      Assessment & Plan:   Problem List Items Addressed This Visit     Diabetic polyneuropathy associated with type 2 diabetes mellitus (HCC)   Relevant Orders   Ambulatory referral to Home Health   Essential hypertension   Moderate episode of recurrent major depressive disorder (HCC)   Obesity, Class I, BMI 30-34.9   Poorly controlled type 2 diabetes mellitus with neuropathy (HCC)   Relevant Orders   Ambulatory referral to Home Health   Other Visit Diagnoses       Closed nondisplaced fracture of greater trochanter of left femur with routine healing, subsequent encounter    -  Primary   Relevant Orders   Ambulatory referral to Home Health     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 Healing through natural process with rest and non-weight bearing. Significant pain limits mobility and chronic weakness with lower extremity muscles at baseline, left foot with ulceration had been on weight bearing so he has been limited with mobility. - Continue rest and non-weight bearing on left leg for hip  healing. - Coordinated with home health for physical therapy and occupational therapy. - Using wheelchair, and for transfers can use crutches, and assistance required  Chronic ulcer of heel and midfoot, healing Ulcer healing well with no signs of infection. - Continue wound care management with home health nurse.  Type 2 diabetes mellitus with complication peripheral neuropathy Blood sugar levels fluctuating, with recent lows due to decreased appetite and food intake. - Last A1c 10.5 - Resume insulin  therapy and Metformin  as prescribed  - Continue using Dexcom G7 for blood sugar monitoring. Out of these since lost a 90 day shipment prior to hospitalization was never delivered, cannot get more until Feb. Give samples of Dexcom G7 today - Coordinate with home health nurse for finger stick blood sugar checks.  Essential hypertension Blood pressure management ongoing with valsartan  160mg  currently, previously dose raised 80 to 160 - Continue valsartan  for blood pressure management.  Hyperlipidemia Cholesterol levels  previously elevated. Currently on rosuvastatin , which is a lower dose with better efficacy than previous lovastatin . - Continue rosuvastatin  for cholesterol management.  Chronic pain Management with oxycodone  and methocarbamol . Concerns about overmedication and sedation during hospital stay. - His mobility is majorly impacted by pain, I have agreed to continue to manage his pain at this time until he can get more assistance and resources available to improve his mobility. - Continue current pain management regimen with oxycodone  and methocarbamol . - Educated on avoiding simultaneous dosing of pain medications to prevent excessive sedation.      Care Coordination Today I have discussed his case with [facility] [place]  Mertel RN who has taken over for this case after Jackson Acron RN. She has not established with patient yet, since he has been hospitalized. I have shared updates on  his status and goals to connect in future to assist with further in home care and management.  Additionally, I have connected with Julian Lemmings LPN [facility] team who has already contacted patient today for [facility] call after discharge. We discussed the importance of need for setting up Home Health ASAP, he has [facility] for wound care RN and they should be contacting / scheduling with patient tomorrow 12/24 Holley Simpers [phone] and we are asking to setup additional immediate ned PT / Home Aide  I have placed new referral for Home Health to continue services and add further support if needed, but already has setup with [facility]  He understands the risks and safety concerns if not able to be mobile or transfer properly and understands if we are unable to arrange home help or if he cannot facilitate other caregiver support, options are limited and may warrant return to hospital or skilled nursing if he cannot be safely managed in the home.  Orders Placed This Encounter  Procedures   Ambulatory referral to Home Health    Referral Priority:   Routine    Referral Type:   Home Health Care    Referral Reason:   Specialty Services Required    Requested Specialty:   Home Health Services    Number of Visits Requested:   1     No orders of the defined types were placed in this encounter.   Follow up plan: Return if symptoms worsen or fail to improve.   Marsa Officer, DO [facility] 10/03/2024, 1:28 PM     [1]  Current Outpatient Medications:    ALPRAZolam  (XANAX ) 1 MG tablet, Take 1 tablet (1 mg total) by mouth daily as needed for anxiety., Disp: 30 tablet, Rfl: 0   metFORMIN  (GLUCOPHAGE -XR) 750 MG 24 hr tablet, Take 2 tablets (1,500 mg total) by mouth daily with breakfast., Disp: 180 tablet, Rfl: 1   oxyCODONE  (OXY IR/ROXICODONE ) 5 MG immediate release tablet, Take 1-2 tablets (5-10 mg total) by mouth every 4 (four) hours as needed for severe pain  (pain score 7-10)., Disp: 60 tablet, Rfl: 0   Blood Glucose Monitoring Suppl (ONETOUCH VERIO FLEX SYSTEM) w/Device KIT, Use to check blood sugar up to four times daily as directed, Disp: 1 kit, Rfl: 0   Continuous Glucose Sensor (DEXCOM G7 SENSOR) MISC, Use 1 sensor every 10 days to check glucose continuously as advised, Disp: 9 each, Rfl: 3   fluticasone  (FLONASE ) 50 MCG/ACT nasal spray, Place 1 spray into both nostrils daily., Disp: 16 g, Rfl: 2   glucose blood (ONETOUCH VERIO) test strip, Use to check blood sugar up to four  times daily as directed, Disp: 400 each, Rfl: 3   insulin  glargine (LANTUS ) 100 UNIT/ML Solostar Pen, Inject 25 Units into the skin daily. (Patient not taking: Reported on 10/03/2024), Disp: 15 mL, Rfl: 11   Insulin  Pen Needle 32G X 4 MM MISC, Use 1 each as directed with insulin ., Disp: 100 each, Rfl: 0   Lancets (ONETOUCH DELICA PLUS LANCET30G) MISC, Use to check blood sugar up to four times daily as directed, Disp: 400 each, Rfl: 3   methocarbamol  (ROBAXIN ) 500 MG tablet, Take 1 tablet (500 mg total) by mouth every 8 (eight) hours as needed for muscle spasms., Disp: 90 tablet, Rfl: 2   polyethylene glycol powder (GLYCOLAX /MIRALAX ) 17 GM/SCOOP powder, Take 17 g by mouth daily. Dissolve 1 capful (17g) in 4-8 ounces of liquid and take by mouth daily., Disp: 238 g, Rfl: 0   rosuvastatin  (CRESTOR ) 20 MG tablet, Take 1 tablet (20 mg total) by mouth daily., Disp: 90 tablet, Rfl: 1   senna-docusate (SENOKOT-S) 8.6-50 MG tablet, Take 1 tablet by mouth 2 (two) times daily., Disp: 180 tablet, Rfl: 1   valsartan  (DIOVAN ) 160 MG tablet, Take 1 tablet (160 mg total) by mouth daily., Disp: 90 tablet, Rfl: 1 [2]  Social History Tobacco Use   Smoking status: Former    Current packs/day: 0.00    Average packs/day: 3.0 packs/day for 20.0 years (60.0 ttl pk-yrs)    Types: Cigarettes    Start date: 10/12/1990    Quit date: 10/12/2010    Years since quitting: 13.9   Smokeless tobacco: Never   Vaping Use   Vaping status: Never Used  Substance Use Topics   Alcohol use: Yes   Drug use: No   "

## 2024-10-03 NOTE — Telephone Encounter (Signed)
 Copied from CRM [id number]. Topic: General - Other >> Oct 03, 2024  4:34 PM Sophia H wrote: Reason for CRM: Patient states he was returning a missed call to [facility]? Don't see anything in chart, please reach out.

## 2024-10-03 NOTE — Transitions of Care (Post Inpatient/ED Visit) (Signed)
 "  10/03/2024  Name: Shawn Rivas MRN: [id number] DOB: [birth year]  Today's TOC FU Call Status: Today's TOC FU Call Status:: Successful TOC FU Call Completed TOC FU Call Complete Date: 10/03/24  Patient's Name and Date of Birth confirmed. Name, DOB  Transition Care Management Follow-up Telephone Call Date of Discharge: 10/02/24 Discharge Facility: Other [facility]) Name of Other (Non[facility]) Discharge Facility: [facility] Type of Discharge: Inpatient Admission Primary Inpatient Discharge Diagnosis:: fracture leg How have you been since you were released from the hospital?: Same Any questions or concerns?: Yes Patient Questions/Concerns:: question about meds  Items Reviewed: Did you receive and understand the discharge instructions provided?: Yes Medications obtained,verified, and reconciled?: Yes (Medications Reviewed) Any new allergies since your discharge?: No Dietary orders reviewed?: Yes Do you have support at home?: No  Medications Reviewed Today: Medications Reviewed Today     Reviewed by Emmitt Pan, LPN (Licensed Practical Nurse) on 10/03/24 at 1014  Med List Status: <None>   Medication Order Taking? Sig Documenting Provider Last Dose Status Informant  ALPRAZolam  (XANAX ) 1 MG tablet [id number] Yes Take 1 tablet (1 mg total) by mouth daily as needed for anxiety. Edman Marsa PARAS, DO  Active   Blood Glucose Monitoring Suppl (ONETOUCH VERIO FLEX SYSTEM) w/Device KIT [id number] Yes Use to check blood sugar up to four times daily as directed Edman Marsa PARAS, DO  Active Self  Continuous Glucose Sensor (DEXCOM G7 SENSOR) MISC [id number] Yes Use 1 sensor every 10 days to check glucose continuously as advised Edman Marsa PARAS, DO  Active Self  fluticasone  (FLONASE ) 50 MCG/ACT nasal spray [id number] Yes Place 1 spray into both nostrils daily. Edman Marsa PARAS, DO  Active   glucose blood (ONETOUCH VERIO) test strip [id number] Yes Use  to check blood sugar up to four times daily as directed Edman Marsa PARAS, DO  Active Self  insulin  glargine (LANTUS ) 100 UNIT/ML Solostar Pen [id number] Yes Inject 25 Units into the skin daily. Edman Marsa PARAS, DO  Active   Insulin  Pen Needle 32G X 4 MM MISC [id number] Yes Use 1 each as directed with insulin . Tobie Calix, MD  Active   Lancets [facility] [place]) MISC [id number] Yes Use to check blood sugar up to four times daily as directed Edman Marsa PARAS, DO  Active Self  metFORMIN  (GLUCOPHAGE -XR) 750 MG 24 hr tablet [id number] Yes Take 2 tablets (1,500 mg total) by mouth daily with breakfast. Edman Marsa PARAS, DO  Active   methocarbamol  (ROBAXIN ) 500 MG tablet [id number] Yes Take 1 tablet (500 mg total) by mouth every 8 (eight) hours as needed for muscle spasms. Edman Marsa PARAS, DO  Active   oxyCODONE  (OXY IR/ROXICODONE ) 5 MG immediate release tablet [id number] Yes Take 1-2 tablets (5-10 mg total) by mouth every 4 (four) hours as needed for severe pain (pain score 7-10). Karamalegos, Marsa PARAS, DO  Active   polyethylene glycol powder (GLYCOLAX /MIRALAX ) 17 GM/SCOOP powder [id number] Yes Take 17 g by mouth daily. Dissolve 1 capful (17g) in 4-8 ounces of liquid and take by mouth daily. Patel, Sona, MD  Active   rosuvastatin  (CRESTOR ) 20 MG tablet [id number] Yes Take 1 tablet (20 mg total) by mouth daily. Edman Marsa PARAS, DO  Active   senna-docusate (SENOKOT-S) 8.6-50 MG tablet [id number] Yes Take 1 tablet by mouth 2 (two) times daily. Edman Marsa PARAS, DO  Active   valsartan  (DIOVAN ) 160 MG tablet [id number] Yes Take 1 tablet (160 mg total) by mouth daily. Edman Marsa PARAS, DO  Active             Home Care and Equipment/Supplies: Were Home Health Services Ordered?: NA Any new equipment or medical supplies ordered?: NA  Functional Questionnaire: Do you need assistance with bathing/showering or dressing?: Yes Do you need  assistance with meal preparation?: Yes Do you need assistance with eating?: No Do you have difficulty maintaining continence: No Do you need assistance with getting out of bed/getting out of a chair/moving?: No Do you have difficulty managing or taking your medications?: No  Follow up appointments reviewed: PCP Follow-up appointment confirmed?: Yes Date of PCP follow-up appointment?: 10/03/24 Follow-up Provider: [facility] Follow-up appointment confirmed?: NA Do you need transportation to your follow-up appointment?: No Do you understand care options if your condition(s) worsen?: Yes-patient verbalized understanding    SIGNATURE Julian Lemmings, LPN [facility] Nurse Health Advisor Direct Dial  [phone]  "

## 2024-10-03 NOTE — Patient Instructions (Addendum)
 Thank you for coming to the office today.  I will be contacting the other people involved for Home Health  Lanette Hamilton Transitions of Care Nurse  I will also call Holley Simpers from [facility]  Please schedule a Follow-up Appointment to: Return if symptoms worsen or fail to improve.  If you have any other questions or concerns, please feel free to call the office or send a message through MyChart. You may also schedule an earlier appointment if necessary.  Additionally, you may be receiving a survey about your experience at our office within a few days to 1 week by e-mail or mail. We value your feedback.  Marsa Officer, DO [facility], [place]

## 2024-10-06 ENCOUNTER — Telehealth: Payer: Self-pay

## 2024-10-06 NOTE — Telephone Encounter (Signed)
 Copied from CRM #[id number]. Topic: Clinical - Home Health Verbal Orders >> Oct 06, 2024 11:57 AM Antony RAMAN wrote: Caller/Agency: [facility] Callback Number: [phone] Service Requested: Physical Therapy Frequency: 2 week 3, 1 week 5 Any new concerns about the patient? No

## 2024-10-06 NOTE — Telephone Encounter (Signed)
 Left message for [facility] on secure voicemail for verbal orders.

## 2024-10-06 NOTE — Telephone Encounter (Signed)
 Okay to proceed w/ verbal orders  Thank  you  Marsa Officer, DO [facility] 10/06/2024, 1:37 PM

## 2024-10-06 NOTE — Telephone Encounter (Signed)
 Copied from CRM [id number]. Topic: General - Other >> Oct 03, 2024  4:45 PM [name] wrote: Reason for CRM: Patient called in states Dr MARLA put in home health orders for him and he spoke with a [name] for 2hrs who was already getting all this set up. I offered to out in home health order. Patient refused and kept demanding to speak with [name], I let him know I dont have ay notes from a [name] about this. Patient wanted me to send the message to the practice anyway

## 2024-10-09 LAB — OPHTHALMOLOGY REPORT-SCANNED

## 2024-10-13 ENCOUNTER — Encounter: Payer: Self-pay | Admitting: Family Medicine

## 2024-10-16 ENCOUNTER — Telehealth: Payer: Self-pay

## 2024-10-16 NOTE — Telephone Encounter (Signed)
 Okay to proceed w/ verbal orders. Thank you!   Yes I will follow-up with him tomorrow on 1/6 for blood sugar and his neuropathy concerns.  Marsa Officer, DO [facility] 10/16/2024, 5:26 PM

## 2024-10-16 NOTE — Telephone Encounter (Signed)
 Copied from CRM [id number]. Topic: Clinical - Home Health Verbal Orders >> Oct 16, 2024  3:38 PM Adelita E wrote: Caller/Agency: Arm[facility], OT with [facility] Callback Number: [phone] (secure line) Service Requested: Occupational Therapy Frequency: 2x/week for 2 weeks and 1x/week for 4 weeks Any new concerns about the patient? Yes, when Armida did her eval, patient stated he has not checked his blood sugar in 3 days, but stated he will follow up with PCP about this at his appointment tomorrow 1/6.

## 2024-10-17 ENCOUNTER — Encounter: Payer: Self-pay | Admitting: Family Medicine

## 2024-10-17 ENCOUNTER — Ambulatory Visit (INDEPENDENT_AMBULATORY_CARE_PROVIDER_SITE_OTHER): Admitting: Family Medicine

## 2024-10-17 VITALS — BP 136/84 | HR 110 | Ht 70.0 in | Wt 220.0 lb

## 2024-10-17 DIAGNOSIS — E11621 Type 2 diabetes mellitus with foot ulcer: Secondary | ICD-10-CM | POA: Diagnosis not present

## 2024-10-17 DIAGNOSIS — M25512 Pain in left shoulder: Secondary | ICD-10-CM

## 2024-10-17 DIAGNOSIS — M79601 Pain in right arm: Secondary | ICD-10-CM

## 2024-10-17 DIAGNOSIS — R202 Paresthesia of skin: Secondary | ICD-10-CM

## 2024-10-17 DIAGNOSIS — R29898 Other symptoms and signs involving the musculoskeletal system: Secondary | ICD-10-CM | POA: Diagnosis not present

## 2024-10-17 DIAGNOSIS — M62541 Muscle wasting and atrophy, not elsewhere classified, right hand: Secondary | ICD-10-CM

## 2024-10-17 DIAGNOSIS — Z794 Long term (current) use of insulin: Secondary | ICD-10-CM

## 2024-10-17 DIAGNOSIS — E1142 Type 2 diabetes mellitus with diabetic polyneuropathy: Secondary | ICD-10-CM | POA: Diagnosis not present

## 2024-10-17 DIAGNOSIS — M5412 Radiculopathy, cervical region: Secondary | ICD-10-CM

## 2024-10-17 DIAGNOSIS — G8929 Other chronic pain: Secondary | ICD-10-CM

## 2024-10-17 DIAGNOSIS — E113212 Type 2 diabetes mellitus with mild nonproliferative diabetic retinopathy with macular edema, left eye: Secondary | ICD-10-CM

## 2024-10-17 DIAGNOSIS — M153 Secondary multiple arthritis: Secondary | ICD-10-CM | POA: Diagnosis not present

## 2024-10-17 DIAGNOSIS — L97525 Non-pressure chronic ulcer of other part of left foot with muscle involvement without evidence of necrosis: Secondary | ICD-10-CM | POA: Diagnosis not present

## 2024-10-17 DIAGNOSIS — Z96649 Presence of unspecified artificial hip joint: Secondary | ICD-10-CM

## 2024-10-17 DIAGNOSIS — F3341 Major depressive disorder, recurrent, in partial remission: Secondary | ICD-10-CM

## 2024-10-17 MED ORDER — OXYCODONE HCL 5 MG PO TABS: 5.0000 mg | ORAL_TABLET | ORAL | 0 refills | Status: DC | PRN

## 2024-10-17 MED ORDER — GABAPENTIN 100 MG PO CAPS: ORAL_CAPSULE | ORAL | 2 refills | Status: AC

## 2024-10-17 MED ORDER — TRESIBA FLEXTOUCH 100 UNIT/ML ~~LOC~~ SOPN: 30.0000 [IU] | PEN_INJECTOR | Freq: Every day | SUBCUTANEOUS | Status: AC

## 2024-10-17 NOTE — Telephone Encounter (Signed)
 Left message on secure line for [facility], ok to proceed with verbal orders. Also notified that he has an appointment today

## 2024-10-17 NOTE — Progress Notes (Addendum)
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     Dispense:  60 tablet    Refill:  0    Please use a reverse-screw-cap for his pill bottles to allow him to grip and open the rx bottle.   gabapentin  (NEURONTIN ) 100 MG capsule    Sig: Start 1 capsule daily at bedtime, increase by 1 additional capsule in evening to max of 300mg  dose in 24 hours    Dispense:  90 capsule    Refill:  2    Please use a reverse-screw-cap for his pill bottles to allow him to grip and open the rx bottle.   insulin  degludec (TRESIBA  FLEXTOUCH) 100 UNIT/ML FlexTouch Pen    Sig: Inject 30-40 Units into the skin daily.    Follow up plan: Return in about 9 weeks (around 12/19/2024) for DM A1c, med refills updates.   Marsa Officer, DO [facility] 10/17/2024, 2:48 PM     [1]  Social History Tobacco Use   Smoking status: Former    Current packs/day: 0.00    Average packs/day: 3.0 packs/day for 20.0 years (60.0 ttl pk-yrs)    Types: Cigarettes    Start date: 10/12/1990    Quit date: 10/12/2010    Years since quitting: 14.0   Smokeless tobacco: Never  Vaping Use   Vaping status: Never Used  Substance Use Topics   Alcohol use: Yes   Drug use: No   "

## 2024-10-17 NOTE — Patient Instructions (Addendum)
 Thank you for coming to the office today.  Referral to Neurology for Nerve Conduction Study both upper extremities.  Caralee Vinie HERO MD  [facility] Neurologist in [place], [place]  Address: [address], [place], [place] [address] Phone: [phone]  We will keep working with our team to get the ramp setup.  Restart Gabapentin  in evening, gradually increase dose to max of 3 = 300mg  nightly  Tresiba  sample 30-40units  Durable MEdical Equipment - [employer] Copywriter ordered.  Please schedule a Follow-up Appointment to: Return in about 9 weeks (around 12/19/2024) for DM A1c, med refills updates.  If you have any other questions or concerns, please feel free to call the office or send a message through MyChart. You may also schedule an earlier appointment if necessary.  Additionally, you may be receiving a survey about your experience at our office within a few days to 1 week by e-mail or mail. We value your feedback.  Marsa Officer, DO [facility], [place]

## 2024-10-18 ENCOUNTER — Telehealth: Payer: Self-pay

## 2024-10-18 MED ORDER — DOXYCYCLINE HYCLATE 100 MG PO TABS: 100.0000 mg | ORAL_TABLET | Freq: Two times a day (BID) | ORAL | 0 refills | Status: AC

## 2024-10-18 NOTE — Telephone Encounter (Signed)
 Acknowledged. No further action required at this time. We are working on his diabetes regimen.  Thank you  Marsa Officer, DO [facility] 10/18/2024, 1:12 PM

## 2024-10-18 NOTE — Addendum Note (Signed)
 Addended by: EDMAN MARSA PARAS on: 10/18/2024 06:14 PM   Modules accepted: Orders

## 2024-10-18 NOTE — Telephone Encounter (Signed)
 Copied from CRM #[id number]. Topic: Clinical - Medical Advice >> Oct 18, 2024 11:54 AM Amy B wrote: Reason for CRM: [facility] nurse reports that current fasting blood sugar is 243, parameters are over 200.

## 2024-10-20 ENCOUNTER — Encounter: Payer: Self-pay | Admitting: Family Medicine

## 2024-10-20 DIAGNOSIS — G8929 Other chronic pain: Secondary | ICD-10-CM

## 2024-10-23 ENCOUNTER — Other Ambulatory Visit: Admitting: Pharmacist

## 2024-10-23 DIAGNOSIS — Z794 Long term (current) use of insulin: Secondary | ICD-10-CM

## 2024-10-23 DIAGNOSIS — E1140 Type 2 diabetes mellitus with diabetic neuropathy, unspecified: Secondary | ICD-10-CM

## 2024-10-23 NOTE — Progress Notes (Signed)
 "  10/23/2024 Name: Shawn Rivas MRN: [id number] DOB: [birth year]  Chief Complaint  Patient presents with   Medication Assistance    Shawn Rivas is a 59 y.o. year old male who presented for a telephone visit.   They were referred to the pharmacist by their PCP for assistance in managing medication access.    Subjective:  Care Team: Primary Care Provider: Edman Marsa PARAS, DO ; Next Scheduled Visit: 12/20/2023 Podiatry: Neill Krystal Fallow, DPM: Next Scheduled Visit: 11/10/2024 Orthopedic Surgery: Edie Norleen Purchase, MD; Next Scheduled Visit: 11/10/2024  Medication Access/Adherence  Current Pharmacy:  [id number] [place], [place] - [address] [place] [address] Phone: [phone] Fax: [phone]   Patient reports affordability concerns with their medications: Yes  Patient reports access/transportation concerns to their pharmacy: No  Patient reports adherence concerns with their medications:  Yes      Note patient with Traumatic Brain Injury affecting cognitive function and daily activities. Patient expresses frustration with healthcare coordination.   Reports cost of Tresiba  has been difficult to afford from his local pharmacy as cost was greater than expected $35 copayment   Diabetes:   Current medications:  - Tresiba  40 units daily - Reports increased to current dose today  Reports currently has ~3 boxes of Tresiba  at home (received from sample) - Reports taking metformin  500 mg twice daily from a previous supply During our call patient locates bottle of metformin  ER 750 mg and states will instead start taking this tomorrow (2 tablets daily) as directed   Medications tried in the past: Humalog ; Lantus     Patient using Dexcom G7 continuous glucose monitoring - Reports his blood sugar today during our call (before breakfast) is 333   Objective:  Lab Results  Component Value Date   HGBA1C 10.5 (H) 09/26/2024    Lab Results   Component Value Date   CREATININE 1.23 09/26/2024   BUN 28 (H) 09/26/2024   NA 135 09/26/2024   K 4.3 09/26/2024   CL 102 09/26/2024   CO2 22 09/26/2024    Lab Results  Component Value Date   CHOL 255 (H) 03/14/2024   HDL 50 03/14/2024   LDLCALC 158 (H) 03/14/2024   TRIG 284 (H) 03/14/2024   CHOLHDL 5.1 (H) 03/14/2024    Medications Ordered Prior to Encounter[1]   Assessment/Plan:   Unable to complete full medication review with patient today.  Note have [facility] on behalf of this patient as patient requesting to receive just a 30 day supply for $35/month copayment of his Tresiba  insulin . However, [facility] advised that they are unable to split a box of pens (5 pens/box) and full box is >30 day supply, costing patient $70 copayment - Have collaborated with [facility], which was able to accommodate patient's request to split box to receive 30 day supply for $35.  Will collaborate with PCP to request if needed in future, for insulin  prescription for patient to be sent to [facility]. Patient declines to have a prescription sent at this time, as has a good supply of Tresiba  at home to use while waiting to see if may qualify for patient assistance  Diabetes: - Currently uncontrolled - Patient plans to switch from previous metformin  IR to metformin  ER 750 mg - 2 tablets (1500 mg) daily as prescribed by PCP - Based on current reported income, patient does not qualify for Extra Help Subsidy - Will  collaborate with PCP regarding potential patient assistance program options for long-acting insulin  for patient - Recommend to continue to use Dexcom G7 CGM to monitor blood sugar/as feedback on dietary choices Recommend to check glucose with fingerstick check when needed for symptoms and as back up to CGM.  Patient to contact office if needed for readings outside of established parameters or symptoms      Follow Up Plan: Clinical Pharmacist will outreach to patient by telephone again within the next 30 days.   Sharyle Sia, PharmD, [facility] Clinical Pharmacist [facility] [phone]      [1]  Current Outpatient Medications on File Prior to Visit  Medication Sig Dispense Refill   ALPRAZolam  (XANAX ) 1 MG tablet Take 1 tablet (1 mg total) by mouth daily as needed for anxiety. 30 tablet 0   Blood Glucose Monitoring Suppl (ONETOUCH VERIO FLEX SYSTEM) w/Device KIT Use to check blood sugar up to four times daily as directed 1 kit 0   Continuous Glucose Sensor (DEXCOM G7 SENSOR) MISC Use 1 sensor every 10 days to check glucose continuously as advised 9 each 3   doxycycline  (VIBRA -TABS) 100 MG tablet Take 1 tablet (100 mg total) by mouth 2 (two) times daily. For 10 days. Take with full glass of water, stay upright 30 min after taking. 20 tablet 0   fluticasone  (FLONASE ) 50 MCG/ACT nasal spray Place 1 spray into both nostrils daily. 16 g 2   gabapentin  (NEURONTIN ) 100 MG capsule Start 1 capsule daily at bedtime, increase by 1 additional capsule in evening to max of 300mg  dose in 24 hours 90 capsule 2   glucose blood (ONETOUCH VERIO) test strip Use to check blood sugar up to four times daily as directed 400 each 3   insulin  degludec (TRESIBA  FLEXTOUCH) 100 UNIT/ML FlexTouch Pen Inject 30-40 Units into the skin daily.     Insulin  Pen Needle 32G X 4 MM MISC Use 1 each as directed with insulin . 100 each 0   Lancets (ONETOUCH DELICA PLUS LANCET30G) MISC Use to check blood sugar up to four times daily as directed 400 each 3   metFORMIN  (GLUCOPHAGE -XR) 750 MG 24 hr tablet Take 2 tablets (1,500 mg total) by mouth daily with breakfast. 180 tablet 1   methocarbamol  (ROBAXIN ) 500 MG tablet Take 1 tablet (500 mg total) by mouth every 8 (eight) hours as needed for muscle spasms. 90 tablet 2   oxyCODONE  (OXY IR/ROXICODONE ) 5 MG immediate release tablet Take 1-2 tablets (5-10 mg  total) by mouth every 4 (four) hours as needed for severe pain (pain score 7-10). 60 tablet 0   polyethylene glycol powder (GLYCOLAX /MIRALAX ) 17 GM/SCOOP powder Take 17 g by mouth daily. Dissolve 1 capful (17g) in 4-8 ounces of liquid and take by mouth daily. 238 g 0   rosuvastatin  (CRESTOR ) 20 MG tablet Take 1 tablet (20 mg total) by mouth daily. (Patient not taking: Reported on 10/17/2024) 90 tablet 1   senna-docusate (SENOKOT-S) 8.6-50 MG tablet Take 1 tablet by mouth 2 (two) times daily. 180 tablet 1   valsartan  (DIOVAN ) 160 MG tablet Take 1 tablet (160 mg total) by mouth daily. 90 tablet 1   No current facility-administered medications on file prior to visit.   "

## 2024-10-23 NOTE — Telephone Encounter (Signed)
 Routing back to [facility] Clinical - I am not exactly sure best way to solve the [facility] Neurology MyChart problem. It sounds like he needs us  to call [facility] Neurology and give them an updated phone number for him? So that way they have it updated in their system. Is this something that you would be able to work on this week?  Also regarding the Home Health - he uses [facility]. Can you try to reach them at [phone] [place], nurse that we contacted before) Or can call the main number [facility] [phone] - we would need to add verbal orders for RN to Treat wounds / wound care. I can share more specific details if she needs - it looks like he should be receiving antibiotic ointment and non-adherent pad dressing change daily or change every time the RN is there.  Thank you  Marsa Officer, DO [facility] 10/23/2024, 6:39 PM

## 2024-10-24 ENCOUNTER — Telehealth: Payer: Self-pay

## 2024-10-24 NOTE — Telephone Encounter (Signed)
 Copied from CRM [id number]. Topic: Clinical - Medical Advice >> Oct 24, 2024  8:57 AM Tiffini S wrote: Reason for CRM: Grayce with [facility] [phone] called about the patients elevated/ fasting blood sugars reading yesterday morning at 10am was 387- patient had his insulin  the night before and hadn't eaten- PT is going good

## 2024-10-24 NOTE — Telephone Encounter (Signed)
 Spoke with Shawn Rivas gave her verbal orders for wound care, she will add this for twice weekly providing insurance will pay.

## 2024-10-24 NOTE — Telephone Encounter (Signed)
 Acknowledged. No major change based on this today. We are working with clinical pharmacy to help micro-manage his blood sugar and treatment options.  Marsa Officer, DO [facility] 10/24/2024, 10:26 AM

## 2024-10-26 ENCOUNTER — Telehealth: Payer: Self-pay

## 2024-10-26 NOTE — Telephone Encounter (Signed)
 Understood. I have reviewed this documentation regarding the fall.  No changes to treatment plan at this time  Marsa Officer, DO [facility] 10/26/2024, 3:48 PM

## 2024-10-26 NOTE — Telephone Encounter (Signed)
 Copied from CRM [id number]. Topic: Clinical - Medical Advice >> Oct 26, 2024  2:34 PM Nathanel BROCKS wrote: Reason for CRM:   Mandy with [facility] called to report a fall for this pt.   Pt fell yesterday in parking lot with no injuries.   Phone [phone] [place]   ----------------------------------------------------------------------- From previous Reason for Contact - Home Health Verbal Orders: Caller/Agency:  Callback Number:  Service Requested:   Frequency:  Any new concerns about the patient?

## 2024-10-27 NOTE — Patient Instructions (Signed)
"   Goals Addressed             This Visit's Progress    Pharmacy Goals       The goal A1c is less than 7%. This is the best way to reduce the risk of the long term complications of diabetes, including heart disease, kidney disease, eye disease, strokes, and nerve damage. An A1c of less than 7% corresponds with fasting sugars less than 130 and 2 hour after meal sugars less than 180.   You can reach Dexcom Support (Monday - Friday 6 AM - 5 PM PST) by calling: [phone]  Thank you  Sharyle Sia, PharmD, [facility] Clinical Pharmacist [facility] [phone]         "

## 2024-10-30 ENCOUNTER — Telehealth: Payer: Self-pay | Admitting: Pharmacist

## 2024-10-30 ENCOUNTER — Other Ambulatory Visit: Payer: Self-pay | Admitting: Pharmacist

## 2024-10-30 DIAGNOSIS — Z794 Long term (current) use of insulin: Secondary | ICD-10-CM

## 2024-10-30 DIAGNOSIS — E1169 Type 2 diabetes mellitus with other specified complication: Secondary | ICD-10-CM

## 2024-10-30 DIAGNOSIS — E1140 Type 2 diabetes mellitus with diabetic neuropathy, unspecified: Secondary | ICD-10-CM

## 2024-10-30 MED ORDER — DEXCOM G7 SENSOR MISC: 3 refills | Status: DC

## 2024-10-30 NOTE — Telephone Encounter (Signed)
 I have placed orders for Dexcom G7 10 day sensors, printed with [employer] information and Printed last office visit note 10/17/24, both ready to fax to [employer]  Marsa Officer, DO [facility] 10/30/2024, 5:26 PM

## 2024-10-30 NOTE — Addendum Note (Signed)
 Addended by: EDMAN MARSA PARAS on: 10/30/2024 05:26 PM   Modules accepted: Orders

## 2024-10-30 NOTE — Progress Notes (Signed)
 Received a message from PCP regarding MyChart message that he received from this patient requesting to change from Dexcom G7 10 day to the 15 day sensor, which was released by manufacturer in December 2025.  Contact [employer] [phone]) on behalf of patient today. Speak with representatives Chasbs and Sharyne who advise that their company does not yet stock the new Dexcom G7 15 days sensors and does not know when/if they will.   Representative reviews patient's current order for CGM sensors and advises patient due for a refill, but that they will need a copy of latest office visit note as documentation of a face-to-face encounter with provider needed every 6 months. - Will collaborate with office  Sharyle Sia, PharmD, [facility] [phone]

## 2024-10-30 NOTE — Progress Notes (Signed)
" ° ° ° °  10/30/2024 Name: Carroll Lingelbach MRN: [id number] DOB: [birth year]  Collaborate with PCP and patient regarding potential patient assistance program options for long-acting insulin  for patient    Based on reported income, patient meets criteria for patient assistance for Basaglar  Kary from [employer] and electronic application available.  - Assist patient with submitting patient portion of application online today - Collaborate with PCP and CMA Aurora Fontana regarding provider portion of application  Also, contacted [employer] today on behalf of patient regarding Dexcom G7 sensor order (see telephone note from today for additional details)  Follow up plan: Will follow up with [employer] to determine patient's application status for Basaglar  assistance within the next 10 days. If approved, will ask PCP to send e-prescription for Basaglar  Kwikpen to [facility] (dispensing pharmacy for [employer])   Sharyle Sia, PharmD, [facility] Clinical Pharmacist [facility] [phone]  "

## 2024-10-31 MED ORDER — OXYCODONE HCL 5 MG PO TABS: 5.0000 mg | ORAL_TABLET | ORAL | 0 refills | Status: AC | PRN

## 2024-10-31 NOTE — Addendum Note (Signed)
 Addended by: EDMAN MARSA PARAS on: 10/31/2024 07:32 PM   Modules accepted: Orders

## 2024-11-03 NOTE — Progress Notes (Signed)
 Shawn Rivas                                          MRN: [id number]   11/03/2024   The VBCI Quality Team Specialist reviewed this patient medical record for the purposes of chart review for care gap closure. The following were reviewed: chart review for care gap closure-glycemic status assessment.    VBCI Quality Team

## 2024-11-08 ENCOUNTER — Other Ambulatory Visit (HOSPITAL_COMMUNITY): Payer: Self-pay

## 2024-11-15 ENCOUNTER — Other Ambulatory Visit: Payer: Self-pay

## 2024-11-15 ENCOUNTER — Other Ambulatory Visit (HOSPITAL_COMMUNITY): Payer: Self-pay

## 2024-11-15 ENCOUNTER — Other Ambulatory Visit: Admitting: Pharmacist

## 2024-11-15 DIAGNOSIS — E1169 Type 2 diabetes mellitus with other specified complication: Secondary | ICD-10-CM

## 2024-11-15 DIAGNOSIS — Z794 Long term (current) use of insulin: Secondary | ICD-10-CM

## 2024-11-15 LAB — OPHTHALMOLOGY REPORT-SCANNED

## 2024-11-15 MED ORDER — DEXCOM G7 15 DAY SENSOR MISC
3 refills | Status: AC
  Filled 2024-11-15: qty 6, 90d supply, fill #0

## 2024-11-15 NOTE — Patient Instructions (Signed)
"   Goals Addressed             This Visit's Progress    Pharmacy Goals       The goal A1c is less than 7%. This is the best way to reduce the risk of the long term complications of diabetes, including heart disease, kidney disease, eye disease, strokes, and nerve damage. An A1c of less than 7% corresponds with fasting sugars less than 130 and 2 hour after meal sugars less than 180.   You can reach Dexcom Support (Monday - Friday 6 AM - 5 PM PST) by calling: [phone]  Please follow up with [facility] as needed for refills of your Dexcom G7 15-day sensors:  [facility] Building [address] [place], [place] [address] [phone]   Thank you  Sharyle Sia, PharmD, [facility] Clinical Pharmacist [facility] [phone]         "

## 2024-11-15 NOTE — Progress Notes (Signed)
 "  11/15/2024 Name: Shawn Rivas MRN: [id number] DOB: 1966-05-13  Chief Complaint  Patient presents with   Medication Management    Shawn Rivas is a 59 y.o. year old male who presented for a telephone visit.   They were referred to the pharmacist by their PCP for assistance in managing medication access.    Conversation limited today as patient states that his Home Health nurse is coming   Subjective:   Care Team: Primary Care Provider: Edman Marsa PARAS, DO ; Next Scheduled Visit: 12/20/2023 Podiatry: Neill Krystal Fallow, DPM Orthopedic Surgery: Edie Norleen Purchase, MD  Medication Access/Adherence  Current Pharmacy:  [facility] [phone] [address] [place], [place] - [address] [place] [address] Phone: [phone] Fax: [phone]   Patient reports affordability concerns with their medications: Yes  Patient reports access/transportation concerns to their pharmacy: No  Patient reports adherence concerns with their medications:  Yes      Note patient with Traumatic Brain Injury affecting cognitive function and daily activities. Patient expresses frustration with healthcare coordination.    Previously reported cost of Tresiba  insulin  is difficult to afford. Collaborated with patient and PCP to aid patient with applying for patient assistance for Basaglar  from [place] patient assistance program. - Outreached to [employer] patient assistance program. Spoke with representative Dayquana who advised patient approved for enrollment 10/31/2024-10/11/2025 and prescriber may e-prescribe prescription to [facility] (dispensing pharmacy for [employer]).    Diabetes:   Current medications:  - Tresiba  40 units daily             Reports currently has >2 boxes of Tresiba  at home (received from sample) Patient interested in plan to switch to Basaglar  insulin  in the future, but requests to hold off on this prescriptions as currently has supply of Tresiba   remaining  Medications tried in the past: Humalog ; Lantus ; Ozempic (cost); metformin  ER  Today patient reports that he self-discontinued his metformin  ER. Reports read about concerns with long-term use of metformin  causing Vitamin B12 deficiency and the subsequent neuropathy and does not wish to take this medication    Patient using Dexcom G7 continuous glucose monitoring - Reports his blood sugar today during our call (before breakfast) is 142 - Patient connects to share CGM data today via Dexcom Clarity. Review past 14-days of data:  Date of Download: 11/15/24 % Time CGM is active: 99.8% Average Glucose: 221 mg/dL Glucose Management Indicator: 8.6%  Glucose Variability: 31% (goal <36%) Time in Goal:  - Time in range 70-180: 33% - Time above range: 67% - Time below range: 0%    Observed patterns: Note significant change in blood sugar control starting on 11/12/2024 with improved overall blood sugar readings. Note if adjust Dexcom Clarity to just summarize the past 4 days of blood sugar readings, average glucose is 166; time in range: 73%; time above range: 27%; time below range: 0%  Patient attributes improvement in blood sugar control over the past 4 day to trying a new diet where he eats 1 meal (supper) per day.  Denies any signs of hypoglycemia   Current Medication Access Support: Patient approved for enrollment in Basaglar  patient assistance from [employer] through 10/11/2025   Objective:  Lab Results  Component Value Date   HGBA1C 10.5 (H) 09/26/2024    Lab Results  Component Value Date   CREATININE 1.23 09/26/2024   BUN 28 (H) 09/26/2024   NA 135 09/26/2024   K 4.3 09/26/2024   CL 102 09/26/2024  CO2 22 09/26/2024    Lab Results  Component Value Date   CHOL 255 (H) 03/14/2024   HDL 50 03/14/2024   LDLCALC 158 (H) 03/14/2024   TRIG 284 (H) 03/14/2024   CHOLHDL 5.1 (H) 03/14/2024    Medications Ordered Prior to Encounter[1]   Assessment/Plan:    Conversation limited today. Unable to complete medication review with patient.  Collaborated with [employer] on behalf of patient and determined that in 2026 for his [employer] [facility] [phone] plan, as an alternative to using DME supplier [facility]), patient may receive his CGM supplies from a local pharmacy - Collaborate with CPhT Moria Clyburn to verify this information via test claim and receive confirmation that patient's cost through [employer] appears to be $0 - Provide this update to patient and he requests to receive a 90 day supply of Dexcom G7 15-day sensors through [facility] Send requested order for Dexcom G7 15-day sensors to [facility] per protocol.  Reach out to pharmacy and receive confirmation that pharmacy has these sensors in stock and prescription for 90 day supply goes through insurance for $0 copayment  Provide update to patient   Diabetes: - Currently uncontrolled - Reviewed dietary modifications including importance of having regular well-balanced meals and snacks spread throughout the day, while controlling carbohydrate portion sizes Advise patient against skipping meals, particularly as he is taking insulin  due to risk of hypoglycemia. However, patient declines stating that he prefers to try current dietary strategy of having one meal/day for now Advise patient to monitor for signs/symptoms of low blood sugar - Provide counseling to patient on association of long-term use of metformin  with reversible Vitamin B12 deficiency.  Discuss recommendation to check Vitamin B12 level and then supplement Vitamin B12 if needed while on metformin  Patient requests to have Vitamin B12 checked at next appointment with PCP, but denies interest in restarting metformin  in the future - Discuss diabetes medication management options. Patient shares history of positive experience with Ozempic in the  past, but that he had to stop this medication due to cost.  Based on reported income, patient would meet criteria for [employer] patient assistance. Patient denies interest in starting this medication now as he would first like to see how well he can control blood sugar with current dietary strategy, but requests to discuss will PCP at upcoming appointment in March - Recommend to continue to use Dexcom G7 CGM to monitor blood sugar/as feedback on dietary choices Recommend to check glucose with fingerstick check when needed for symptoms and as back up to CGM.  Patient to contact office if needed for readings outside of established parameters or symptoms     Follow Up Plan: Clinical Pharmacist will outreach to patient by telephone again in April.   Sharyle Sia, PharmD, BCACP Clinical Pharmacist [facility] [phone]      [1]  Current Outpatient Medications on File Prior to Visit  Medication Sig Dispense Refill   insulin  degludec (TRESIBA  FLEXTOUCH) 100 UNIT/ML FlexTouch Pen Inject 30-40 Units into the skin daily. (Patient taking differently: Inject 40 Units into the skin daily.)     valsartan  (DIOVAN ) 160 MG tablet Take 1 tablet (160 mg total) by mouth daily. 90 tablet 1   ALPRAZolam  (XANAX ) 1 MG tablet Take 1 tablet (1 mg total) by mouth daily as needed for anxiety. 30 tablet 0   Blood Glucose Monitoring Suppl (ONETOUCH VERIO FLEX SYSTEM) w/Device KIT Use to check blood  sugar up to four times daily as directed 1 kit 0   doxycycline  (VIBRA -TABS) 100 MG tablet Take 1 tablet (100 mg total) by mouth 2 (two) times daily. For 10 days. Take with full glass of water, stay upright 30 min after taking. 20 tablet 0   fluticasone  (FLONASE ) 50 MCG/ACT nasal spray Place 1 spray into both nostrils daily. 16 g 2   gabapentin  (NEURONTIN ) 100 MG capsule Start 1 capsule daily at bedtime, increase by 1 additional capsule in evening to max of 300mg  dose in 24 hours 90 capsule  2   glucose blood (ONETOUCH VERIO) test strip Use to check blood sugar up to four times daily as directed 400 each 3   Insulin  Pen Needle 32G X 4 MM MISC Use 1 each as directed with insulin . 100 each 0   Lancets (ONETOUCH DELICA PLUS LANCET30G) MISC Use to check blood sugar up to four times daily as directed 400 each 3   metFORMIN  (GLUCOPHAGE -XR) 750 MG 24 hr tablet Take 2 tablets (1,500 mg total) by mouth daily with breakfast. (Patient not taking: Reported on 11/15/2024) 180 tablet 1   methocarbamol  (ROBAXIN ) 500 MG tablet Take 1 tablet (500 mg total) by mouth every 8 (eight) hours as needed for muscle spasms. 90 tablet 2   oxyCODONE  (OXY IR/ROXICODONE ) 5 MG immediate release tablet Take 1-2 tablets (5-10 mg total) by mouth every 4 (four) hours as needed for severe pain (pain score 7-10). 60 tablet 0   polyethylene glycol powder (GLYCOLAX /MIRALAX ) 17 GM/SCOOP powder Take 17 g by mouth daily. Dissolve 1 capful (17g) in 4-8 ounces of liquid and take by mouth daily. 238 g 0   rosuvastatin  (CRESTOR ) 20 MG tablet Take 1 tablet (20 mg total) by mouth daily. (Patient not taking: Reported on 10/17/2024) 90 tablet 1   senna-docusate (SENOKOT-S) 8.6-50 MG tablet Take 1 tablet by mouth 2 (two) times daily. 180 tablet 1   No current facility-administered medications on file prior to visit.   "

## 2024-12-19 ENCOUNTER — Ambulatory Visit: Admitting: Family Medicine

## 2025-01-22 ENCOUNTER — Other Ambulatory Visit
